# Patient Record
Sex: Female | Born: 1960 | Race: Black or African American | Hispanic: No | Marital: Married | Smoking: Former smoker
Health system: Southern US, Community
[De-identification: ages and names within clinical notes are randomized; demographics above are authoritative.]

## PROBLEM LIST (undated history)

## (undated) DIAGNOSIS — F32A Depression, unspecified: Secondary | ICD-10-CM

## (undated) DIAGNOSIS — Z8661 Personal history of infections of the central nervous system: Secondary | ICD-10-CM

## (undated) DIAGNOSIS — M1990 Unspecified osteoarthritis, unspecified site: Secondary | ICD-10-CM

## (undated) DIAGNOSIS — K219 Gastro-esophageal reflux disease without esophagitis: Secondary | ICD-10-CM

## (undated) DIAGNOSIS — H9190 Unspecified hearing loss, unspecified ear: Secondary | ICD-10-CM

## (undated) DIAGNOSIS — I10 Essential (primary) hypertension: Secondary | ICD-10-CM

## (undated) DIAGNOSIS — J45909 Unspecified asthma, uncomplicated: Secondary | ICD-10-CM

## (undated) DIAGNOSIS — E785 Hyperlipidemia, unspecified: Secondary | ICD-10-CM

## (undated) DIAGNOSIS — G43909 Migraine, unspecified, not intractable, without status migrainosus: Secondary | ICD-10-CM

## (undated) DIAGNOSIS — G8929 Other chronic pain: Secondary | ICD-10-CM

## (undated) DIAGNOSIS — T7840XA Allergy, unspecified, initial encounter: Secondary | ICD-10-CM

## (undated) DIAGNOSIS — M797 Fibromyalgia: Secondary | ICD-10-CM

## (undated) HISTORY — DX: Other chronic pain: G89.29

## (undated) HISTORY — DX: Unspecified asthma, uncomplicated: J45.909

## (undated) HISTORY — DX: Gastro-esophageal reflux disease without esophagitis: K21.9

## (undated) HISTORY — PX: DILATION AND CURETTAGE OF UTERUS: SHX78

## (undated) HISTORY — PX: KNEE SURGERY: SHX244

## (undated) HISTORY — PX: LUMBAR SPINE SURGERY: SHX701

## (undated) HISTORY — PX: HAND SURGERY: SHX662

## (undated) HISTORY — DX: Allergy, unspecified, initial encounter: T78.40XA

## (undated) HISTORY — PX: OTHER SURGICAL HISTORY: SHX169

## (undated) HISTORY — DX: Migraine, unspecified, not intractable, without status migrainosus: G43.909

## (undated) HISTORY — DX: Essential (primary) hypertension: I10

## (undated) HISTORY — PX: ABDOMINAL HYSTERECTOMY: SHX81

## (undated) HISTORY — PX: APPENDECTOMY: SHX54

## (undated) HISTORY — DX: Depression, unspecified: F32.A

## (undated) HISTORY — DX: Hyperlipidemia, unspecified: E78.5

## (undated) HISTORY — DX: Unspecified hearing loss, unspecified ear: H91.90

---

## 2013-02-12 HISTORY — PX: KNEE SURGERY: SHX244

## 2014-02-12 HISTORY — PX: LUMBAR FUSION: SHX111

## 2017-03-15 HISTORY — PX: COLONOSCOPY: SHX174

## 2017-12-28 LAB — HM COLONOSCOPY

## 2020-06-03 DIAGNOSIS — M542 Cervicalgia: Secondary | ICD-10-CM | POA: Diagnosis not present

## 2020-06-03 DIAGNOSIS — M25511 Pain in right shoulder: Secondary | ICD-10-CM | POA: Diagnosis not present

## 2020-06-12 ENCOUNTER — Ambulatory Visit (INDEPENDENT_AMBULATORY_CARE_PROVIDER_SITE_OTHER): Payer: Medicare HMO | Admitting: Medical

## 2020-06-12 ENCOUNTER — Other Ambulatory Visit: Payer: Self-pay

## 2020-06-12 ENCOUNTER — Encounter: Payer: Self-pay | Admitting: Medical

## 2020-06-12 VITALS — BP 122/82 | HR 61 | Ht 63.0 in | Wt 151.2 lb

## 2020-06-12 DIAGNOSIS — R5383 Other fatigue: Secondary | ICD-10-CM | POA: Insufficient documentation

## 2020-06-12 DIAGNOSIS — G43809 Other migraine, not intractable, without status migrainosus: Secondary | ICD-10-CM

## 2020-06-12 DIAGNOSIS — M545 Low back pain, unspecified: Secondary | ICD-10-CM | POA: Diagnosis not present

## 2020-06-12 DIAGNOSIS — R110 Nausea: Secondary | ICD-10-CM | POA: Insufficient documentation

## 2020-06-12 DIAGNOSIS — F17200 Nicotine dependence, unspecified, uncomplicated: Secondary | ICD-10-CM | POA: Insufficient documentation

## 2020-06-12 DIAGNOSIS — R202 Paresthesia of skin: Secondary | ICD-10-CM

## 2020-06-12 DIAGNOSIS — M79601 Pain in right arm: Secondary | ICD-10-CM

## 2020-06-12 DIAGNOSIS — W19XXXA Unspecified fall, initial encounter: Secondary | ICD-10-CM | POA: Insufficient documentation

## 2020-06-12 DIAGNOSIS — K219 Gastro-esophageal reflux disease without esophagitis: Secondary | ICD-10-CM | POA: Diagnosis not present

## 2020-06-12 DIAGNOSIS — I10 Essential (primary) hypertension: Secondary | ICD-10-CM | POA: Insufficient documentation

## 2020-06-12 DIAGNOSIS — F339 Major depressive disorder, recurrent, unspecified: Secondary | ICD-10-CM | POA: Diagnosis not present

## 2020-06-12 DIAGNOSIS — M79602 Pain in left arm: Secondary | ICD-10-CM | POA: Diagnosis not present

## 2020-06-12 DIAGNOSIS — G8929 Other chronic pain: Secondary | ICD-10-CM | POA: Insufficient documentation

## 2020-06-12 DIAGNOSIS — G43909 Migraine, unspecified, not intractable, without status migrainosus: Secondary | ICD-10-CM | POA: Insufficient documentation

## 2020-06-12 DIAGNOSIS — G4700 Insomnia, unspecified: Secondary | ICD-10-CM | POA: Diagnosis not present

## 2020-06-12 DIAGNOSIS — E785 Hyperlipidemia, unspecified: Secondary | ICD-10-CM | POA: Diagnosis not present

## 2020-06-12 DIAGNOSIS — R7989 Other specified abnormal findings of blood chemistry: Secondary | ICD-10-CM | POA: Insufficient documentation

## 2020-06-12 DIAGNOSIS — R6889 Other general symptoms and signs: Secondary | ICD-10-CM | POA: Insufficient documentation

## 2020-06-12 DIAGNOSIS — Z1321 Encounter for screening for nutritional disorder: Secondary | ICD-10-CM | POA: Insufficient documentation

## 2020-06-12 NOTE — Progress Notes (Addendum)
Subjective:  Desiree Thomas is a 60 y.o. female who presents for Chief Complaint  Patient presents with  . New Patient (Initial Visit)    Pt present to establish care and discuss right shoulder pain. Seen Dewaine Conger and had x-rays done.       Here as a new patient.  Was seeing [facility] prior.  Was seeing pain clinic, neurology, PCP.  Was not seeing counseling or psychiatrist before she moved.     Moved to [place] in 02/2020 after getting married  Here to establish care.  On disability for migraines and depression  Reviewed list of medicaiton  Having right shoulder pains.  Just established with Dr. Madelon Lips, [facility], ortho  recently started having problems with fingers getting numb.    Always cold feeling.  Curious if anemic.    Hx/o hysterectomy but still has both ovaries.  She notes that most of her medicaiton are for migraines.  She notes she does not have hypertension, but on the BP medications for migraines.  Likewise states she is on Prozac and Seroquel for migraines.   Takes Propranolol ER 60mg  daily QHS.  seroquel 25mg  prn for pain, sleep for breakthrough migraines  She notes history of falls a few times this past year in recent months.  No other aggravating or relieving factors.    No other c/o.  Past Medical History:  Diagnosis Date  . Allergy   . Asthma   . Chronic back pain   . Depression   . GERD (gastroesophageal reflux disease)   . Hearing loss   . Hyperlipidemia   . Hypertension   . Migraine    Current Outpatient Medications on File Prior to Visit  Medication Sig Dispense Refill  . amLODipine (NORVASC) 10 MG tablet     . atorvastatin (LIPITOR) 40 MG tablet     . b complex vitamins capsule Take 1 capsule by mouth daily.    . COLLAGEN PO Take by mouth.    . esomeprazole (NEXIUM) 40 MG capsule     . FLUoxetine (PROZAC) 20 MG capsule Take 40 mg by mouth daily.    . magnesium gluconate (MAGONATE) 500 MG tablet Take 500 mg by mouth daily.    .  methocarbamol (ROBAXIN) 750 MG tablet Take 750 mg by mouth every 8 (eight) hours as needed. Takes prn day , but QHS daily    . ondansetron (ZOFRAN-ODT) 8 MG disintegrating tablet Take 8 mg by mouth every 8 (eight) hours as needed for nausea or vomiting.    oxybutynin (DITROPAN) 5 MG tablet Take 5 mg by mouth 2 (two) times daily.    . pregabalin (LYRICA) 150 MG capsule Take 150 mg by mouth in the morning, at noon, and at bedtime.    . propranolol ER (INDERAL LA) 60 MG 24 hr capsule Take 60 mg by mouth at bedtime.    QUEtiapine (SEROQUEL) 25 MG tablet Take 25 mg by mouth as needed.    . rizatriptan (MAXALT) 5 MG tablet Take 5 mg by mouth as needed for migraine. May repeat in 2 hours if needed    . traZODone (DESYREL) 50 MG tablet Take 100 mg by mouth at bedtime.     No current facility-administered medications on file prior to visit.     The following portions of the patient's history were reviewed and updated as appropriate: allergies, current medications, past family history, past medical history, past social history, past surgical history and problem list.  ROS Otherwise  as in subjective above    Objective: BP 122/82   Pulse 61   Ht 5\' 3"  (1.6 m)   Wt 151 lb 3.2 oz (68.6 kg)   SpO2 97%   BMI 26.78 kg/m   General appearance: alert, no distress, well developed, well nourished, African American female Neck: supple, no lymphadenopathy, no thyromegaly, no masses, nontender, no bruits Heart: RRR, normal S1, S2, no murmurs Lungs: CTA bilaterally, no wheezes, rhonchi, or rales Pulses: 2+ radial pulses, 2+ pedal pulses, normal cap refill Ext: no edema -phanels and tinels, relatively normal strength and sensation of arms    Assessment: Encounter Diagnoses  Name Primary?  . Chronic bilateral low back pain, unspecified whether sciatica present Yes  . Depression, recurrent (HCC)   . Smoker   . Gastroesophageal reflux disease, unspecified whether esophagitis present   . Insomnia,  unspecified type   . Other migraine without status migrainosus, not intractable   . Nausea   . Hyperlipidemia, unspecified hyperlipidemia type   . Paresthesia and pain of both upper extremities   . Encounter for vitamin deficiency screening   . Fall, initial encounter   . Fatigue, unspecified type   . Cold feeling   . Elevated LFTs      Plan: We discussed her medical history, reviewed her list of medications.  She requests referral to pain clinic.  She was seen in pain clinic regularly up [facility].  She has chronic pain, high risk medications.  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  She denies hypertension diagnosis  Labs today given her concern of feeling cold and high risk medication, history of elevated liver test  Discussed fall prevention.  Lorel was seen today for new patient (initial visit).  Diagnoses and all orders for this visit:  Chronic bilateral low back pain, unspecified whether sciatica present -     Ambulatory referral to Pain Clinic  Depression, recurrent (HCC) -     Ambulatory referral to Pain Clinic  Smoker  Gastroesophageal reflux disease, unspecified whether esophagitis present  Insomnia, unspecified type  Other migraine without status migrainosus, not intractable -     Ambulatory referral to Pain Clinic -     CBC with Differential/Platelet -     TSH -     Comprehensive metabolic panel -     VITAMIN D 25 Hydroxy (Vit-D Deficiency, Fractures) -     Vitamin B12  Nausea  Hyperlipidemia, unspecified hyperlipidemia type -     Comprehensive metabolic panel  Paresthesia and pain of both upper extremities -     Ambulatory referral to Pain Clinic -     CBC with Differential/Platelet -     TSH -     Comprehensive  metabolic panel -     VITAMIN D 25 Hydroxy (Vit-D Deficiency, Fractures) -     Vitamin B12  Encounter for vitamin deficiency screening -     VITAMIN D 25 Hydroxy (Vit-D Deficiency, Fractures) -     Vitamin B12  Fall, initial encounter  Fatigue, unspecified type  Cold feeling  Elevated LFTs    Follow up: pending labs

## 2020-06-13 LAB — COMPREHENSIVE METABOLIC PANEL
ALT: 27 IU/L (ref 0–32)
AST: 32 IU/L (ref 0–40)
Albumin/Globulin Ratio: 1.8 (ref 1.2–2.2)
Albumin: 4.4 g/dL (ref 3.8–4.9)
Alkaline Phosphatase: 85 IU/L (ref 44–121)
BUN/Creatinine Ratio: 16 (ref 9–23)
BUN: 12 mg/dL (ref 6–24)
Bilirubin Total: 0.2 mg/dL (ref 0.0–1.2)
CO2: 23 mmol/L (ref 20–29)
Calcium: 9.5 mg/dL (ref 8.7–10.2)
Chloride: 102 mmol/L (ref 96–106)
Creatinine, Ser: 0.77 mg/dL (ref 0.57–1.00)
Globulin, Total: 2.5 g/dL (ref 1.5–4.5)
Glucose: 81 mg/dL (ref 65–99)
Potassium: 4.6 mmol/L (ref 3.5–5.2)
Sodium: 139 mmol/L (ref 134–144)
Total Protein: 6.9 g/dL (ref 6.0–8.5)
eGFR: 89 mL/min/{1.73_m2} (ref >59)

## 2020-06-13 LAB — CBC WITH DIFFERENTIAL/PLATELET
Basophils Absolute: 0.1 10*3/uL (ref 0.0–0.2)
Basos: 1 %
EOS (ABSOLUTE): 0.1 10*3/uL (ref 0.0–0.4)
Eos: 2 %
Hematocrit: 36.9 % (ref 34.0–46.6)
Hemoglobin: 12.4 g/dL (ref 11.1–15.9)
Immature Grans (Abs): 0 10*3/uL (ref 0.0–0.1)
Immature Granulocytes: 0 %
Lymphocytes Absolute: 2.4 10*3/uL (ref 0.7–3.1)
Lymphs: 38 %
MCH: 28.8 pg (ref 26.6–33.0)
MCHC: 33.6 g/dL (ref 31.5–35.7)
MCV: 86 fL (ref 79–97)
Monocytes Absolute: 0.6 10*3/uL (ref 0.1–0.9)
Monocytes: 9 %
Neutrophils Absolute: 3.3 10*3/uL (ref 1.4–7.0)
Neutrophils: 50 %
Platelets: 341 10*3/uL (ref 150–450)
RBC: 4.31 x10E6/uL (ref 3.77–5.28)
RDW: 15.2 % (ref 11.7–15.4)
WBC: 6.5 10*3/uL (ref 3.4–10.8)

## 2020-06-13 LAB — VITAMIN D 25 HYDROXY (VIT D DEFICIENCY, FRACTURES): Vit D, 25-Hydroxy: 17.6 ng/mL — ABNORMAL LOW (ref 30.0–100.0)

## 2020-06-13 LAB — TSH: TSH: 0.891 u[IU]/mL (ref 0.450–4.500)

## 2020-06-13 LAB — VITAMIN B12: Vitamin B-12: 1415 pg/mL — ABNORMAL HIGH (ref 232–1245)

## 2020-06-13 NOTE — Progress Notes (Signed)
Referral sent to pain clinic.

## 2020-06-24 DIAGNOSIS — G894 Chronic pain syndrome: Secondary | ICD-10-CM | POA: Diagnosis not present

## 2020-06-24 DIAGNOSIS — M25519 Pain in unspecified shoulder: Secondary | ICD-10-CM | POA: Diagnosis not present

## 2020-06-24 DIAGNOSIS — M4727 Other spondylosis with radiculopathy, lumbosacral region: Secondary | ICD-10-CM | POA: Diagnosis not present

## 2020-06-26 ENCOUNTER — Other Ambulatory Visit: Payer: Self-pay | Admitting: Physician Assistant

## 2020-06-26 ENCOUNTER — Ambulatory Visit
Admission: RE | Admit: 2020-06-26 | Discharge: 2020-06-26 | Disposition: A | Discharge status: Home / Self Care | Payer: Medicare HMO | Source: Ambulatory Visit | Attending: Physician Assistant | Admitting: Physician Assistant

## 2020-06-26 DIAGNOSIS — M25511 Pain in right shoulder: Secondary | ICD-10-CM | POA: Diagnosis not present

## 2020-06-26 DIAGNOSIS — M545 Low back pain, unspecified: Secondary | ICD-10-CM | POA: Diagnosis not present

## 2020-06-26 DIAGNOSIS — M4727 Other spondylosis with radiculopathy, lumbosacral region: Secondary | ICD-10-CM

## 2020-06-30 DIAGNOSIS — M542 Cervicalgia: Secondary | ICD-10-CM | POA: Diagnosis not present

## 2020-07-07 DIAGNOSIS — M542 Cervicalgia: Secondary | ICD-10-CM | POA: Diagnosis not present

## 2020-07-10 NOTE — Progress Notes (Deleted)
No show

## 2020-07-11 ENCOUNTER — Ambulatory Visit: Payer: Self-pay | Admitting: Family Medicine

## 2020-08-05 ENCOUNTER — Telehealth: Payer: Self-pay | Admitting: Medical

## 2020-08-05 ENCOUNTER — Other Ambulatory Visit: Payer: Self-pay | Admitting: Medical

## 2020-08-05 ENCOUNTER — Ambulatory Visit
Admission: RE | Admit: 2020-08-05 | Discharge: 2020-08-05 | Disposition: A | Discharge status: Home / Self Care | Payer: Medicare HMO | Source: Ambulatory Visit | Attending: Medical | Admitting: Medical

## 2020-08-05 ENCOUNTER — Other Ambulatory Visit: Payer: Self-pay

## 2020-08-05 DIAGNOSIS — Z139 Encounter for screening, unspecified: Secondary | ICD-10-CM

## 2020-08-05 DIAGNOSIS — Z1231 Encounter for screening mammogram for malignant neoplasm of breast: Secondary | ICD-10-CM | POA: Diagnosis not present

## 2020-08-05 MED ORDER — PREGABALIN 150 MG PO CAPS: 150.0000 mg | ORAL_CAPSULE | Freq: Three times a day (TID) | ORAL | 0 refills | Status: DC

## 2020-08-05 NOTE — Telephone Encounter (Signed)
Pt came in and requested refills on pregabalin. Please send to [facility] at [address] and [address].

## 2020-09-04 ENCOUNTER — Ambulatory Visit (INDEPENDENT_AMBULATORY_CARE_PROVIDER_SITE_OTHER): Payer: Medicare HMO | Admitting: Medical

## 2020-09-04 ENCOUNTER — Encounter: Payer: Self-pay | Admitting: Medical

## 2020-09-04 ENCOUNTER — Other Ambulatory Visit: Payer: Self-pay

## 2020-09-04 VITALS — BP 110/72 | HR 56 | Ht 62.0 in | Wt 150.6 lb

## 2020-09-04 DIAGNOSIS — E559 Vitamin D deficiency, unspecified: Secondary | ICD-10-CM

## 2020-09-04 DIAGNOSIS — M79601 Pain in right arm: Secondary | ICD-10-CM

## 2020-09-04 DIAGNOSIS — E785 Hyperlipidemia, unspecified: Secondary | ICD-10-CM

## 2020-09-04 DIAGNOSIS — M79602 Pain in left arm: Secondary | ICD-10-CM

## 2020-09-04 DIAGNOSIS — G4700 Insomnia, unspecified: Secondary | ICD-10-CM

## 2020-09-04 DIAGNOSIS — Z23 Encounter for immunization: Secondary | ICD-10-CM | POA: Diagnosis not present

## 2020-09-04 DIAGNOSIS — Z1159 Encounter for screening for other viral diseases: Secondary | ICD-10-CM

## 2020-09-04 DIAGNOSIS — N3281 Overactive bladder: Secondary | ICD-10-CM

## 2020-09-04 DIAGNOSIS — M545 Low back pain, unspecified: Secondary | ICD-10-CM | POA: Diagnosis not present

## 2020-09-04 DIAGNOSIS — K219 Gastro-esophageal reflux disease without esophagitis: Secondary | ICD-10-CM

## 2020-09-04 DIAGNOSIS — F17200 Nicotine dependence, unspecified, uncomplicated: Secondary | ICD-10-CM | POA: Diagnosis not present

## 2020-09-04 DIAGNOSIS — Z0000 Encounter for general adult medical examination without abnormal findings: Secondary | ICD-10-CM | POA: Diagnosis not present

## 2020-09-04 DIAGNOSIS — F324 Major depressive disorder, single episode, in partial remission: Secondary | ICD-10-CM

## 2020-09-04 DIAGNOSIS — M542 Cervicalgia: Secondary | ICD-10-CM | POA: Diagnosis not present

## 2020-09-04 DIAGNOSIS — G43809 Other migraine, not intractable, without status migrainosus: Secondary | ICD-10-CM

## 2020-09-04 DIAGNOSIS — G8929 Other chronic pain: Secondary | ICD-10-CM

## 2020-09-04 DIAGNOSIS — I10 Essential (primary) hypertension: Secondary | ICD-10-CM

## 2020-09-04 DIAGNOSIS — R110 Nausea: Secondary | ICD-10-CM

## 2020-09-04 DIAGNOSIS — R202 Paresthesia of skin: Secondary | ICD-10-CM

## 2020-09-04 NOTE — Addendum Note (Signed)
Addended by: Victorio Palm on: 09/04/2020 04:13 PM   Modules accepted: Orders

## 2020-09-04 NOTE — Progress Notes (Addendum)
Subjective:   HPI  Desiree Thomas is a 60 y.o. female who presents for Chief Complaint  Patient presents with   Annual Exam    Patient Care Team: Abdulwahab Demelo, Cleda Mccreedyavid S, PA-C as PCP - General (Family Medicine) Sees dentist [facility] her in past year Pain management  Concerns: [facility]  she like to see a different orthopedic.  She ended up having MRI of right shoulder and neck.  She notes that there was not any specific plan on how to move forward.  She does not have a follow-up with them.  She continues to have quite a bit of pain in her right upper arm and chest wall and the pain seems to radiate into the breast but mostly concentrated around the right upper arm and shoulder.  She does at times get tingly and weak in the right arm.  She has a history of chronic back pain, history of low back surgery and is on disability for pain and migraines.  She does take Lyrica 3 times a day, uses tramadol fairly regularly.  She does see a pain clinic  She has migraines.  When she has a flareup she uses Tylenol and if that does not help she goes to Maxalt and if that still does not help she goes to Seroquel.  She would like to see a neurologist locally for management of migraines.  Overall they have been relatively controlled of late  Insomnia-uses trazodone nightly for sleep  She continues on vitamin D supplement.  She takes medicine for acid reflux Nexium 40 mg daily.  Hyperlipidemia-compliant with Lipitor 40 mg daily  She is compliant with Prozac 20 mg daily  Hypertension-compliant with amlodipine 10 mg daily, propanolol 60 mg daily  She has history of endometriosis and prior hysterectomy for this  Reviewed their medical, surgical, family, social, medication, and allergy history and updated chart as appropriate.  Past Medical History:  Diagnosis Date   Allergy    Asthma    Chronic  back pain    Depression    GERD (gastroesophageal reflux disease)    Hearing loss    Hyperlipidemia    Hypertension    Migraine     Family History  Problem Relation Age of Onset   Arthritis Mother    Heart disease Mother    Asthma Mother    Diverticulitis Mother    Diabetes Father    Diverticulitis Father    Arthritis Sister    Asthma Sister    Arthritis Sister    Asthma Sister    Cancer Maternal Grandmother        stomach   Diabetes Paternal Grandmother    Dementia Paternal Grandmother      Current Outpatient Medications:    amLODipine (NORVASC) 10 MG tablet, , Disp: , Rfl:    atorvastatin (LIPITOR) 40 MG tablet, , Disp: , Rfl:    b complex vitamins capsule, Take 1 capsule by mouth daily., Disp: , Rfl:    COLLAGEN PO, Take by mouth., Disp: , Rfl:    esomeprazole (NEXIUM) 40 MG capsule, , Disp: , Rfl:    FLUoxetine (PROZAC) 20 MG capsule, Take 40 mg by mouth daily., Disp: , Rfl:    magnesium gluconate (MAGONATE) 500 MG tablet, Take 500 mg by mouth daily., Disp: , Rfl:    methocarbamol (ROBAXIN) 750 MG tablet, Take 750 mg by mouth every 8 (eight) hours as  needed. Takes prn day , but QHS daily, Disp: , Rfl:    ondansetron (ZOFRAN-ODT) 8 MG disintegrating tablet, Take 8 mg by mouth every 8 (eight) hours as needed for nausea or vomiting., Disp: , Rfl:    oxybutynin (DITROPAN) 5 MG tablet, Take 5 mg by mouth 2 (two) times daily., Disp: , Rfl:    pregabalin (LYRICA) 150 MG capsule, Take 1 capsule (150 mg total) by mouth in the morning, at noon, and at bedtime., Disp: 90 capsule, Rfl: 0   propranolol ER (INDERAL LA) 60 MG 24 hr capsule, Take 60 mg by mouth at bedtime., Disp: , Rfl:    QUEtiapine (SEROQUEL) 25 MG tablet, Take 25 mg by mouth as needed., Disp: , Rfl:    rizatriptan (MAXALT) 5 MG tablet, Take 5 mg by mouth as needed for migraine. May repeat in 2 hours if needed, Disp: , Rfl:    traMADol (ULTRAM) 50 MG tablet, Take by mouth every 6 (six) hours as needed., Disp: , Rfl:     traZODone (DESYREL) 50 MG tablet, Take 100 mg by mouth at bedtime., Disp: , Rfl:   Allergies  Allergen Reactions   Morphine And Related Anaphylaxis   Penicillins Hives   Vicodin Hp [Hydrocodone-Acetaminophen] Itching     Review of Systems Constitutional: -fever, -chills, -sweats, -unexpected weight change, -decreased appetite, -fatigue Allergy: -sneezing, -itching, -congestion Dermatology: -changing moles, --rash, -lumps ENT: -runny nose, -ear pain, -sore throat, -hoarseness, -sinus pain, -teeth pain, - ringing in ears, -hearing loss, -nosebleeds Cardiology: -chest pain, -palpitations, -swelling, -difficulty breathing when lying flat, -waking up short of breath Respiratory: -cough, -shortness of breath, -difficulty breathing with exercise or exertion, -wheezing, -coughing up blood Gastroenterology: -abdominal pain, -nausea, -vomiting, -diarrhea, -constipation, -blood in stool, -changes in bowel movement, -difficulty swallowing or eating Hematology: -bleeding, -bruising  Musculoskeletal: +arthritis, -muscle aches, -joint swelling, +chronic back pain, +neck pain, -cramping, -changes in gait Ophthalmology: denies vision changes, eye redness, itching, discharge Urology: -burning with urination, -difficulty urinating, -blood in urine, -urinary frequency, -urgency, -incontinence Neurology: -headache, -weakness, +tingling, weakness, pain in right arm, -memory loss, -falls, -dizziness Psychology: -depressed mood, -agitation, +sleep problems Breast/gyn: -breast tendnerss, -discharge, -lumps, -vaginal discharge,- irregular periods, -heavy periods   Depression screen [facility] 2/9 09/04/2020 06/12/2020  Decreased Interest 0 0  Down, Depressed, Hopeless 0 0  PHQ - 2 Score 0 0  Altered sleeping 0 -  Tired, decreased energy 0 -  Change in appetite 0 -  Feeling bad or failure about yourself  0 -  Trouble concentrating 0 -  Moving slowly or fidgety/restless 0 -  Suicidal thoughts 0 -  PHQ-9 Score 0  -       Objective:  BP 110/72   Pulse (!) 56   Ht  (1.575 m)   Wt 150 lb 9.6 oz (68.3 kg)   SpO2 96%   BMI 27.55 kg/m   General appearance: alert, no distress, WD/WN, African American female Skin: tattoo left wrist, otherwise no worrisome lesions HEENT: normocephalic, conjunctiva/corneas normal, sclerae anicteric, PERRLA, EOMi Neck: posterior and posterolateral tendnerss right, otherwise supple, no lymphadenopathy, no thyromegaly, no masses, normal ROM, no bruits Chest: non tender, normal shape and expansion Heart: RRR, normal S1, S2, no murmurs Lungs: CTA bilaterally, no wheezes, rhonchi, or rales Abdomen: +bs, soft, non tender, non distended, no masses, no hepatomegaly, no splenomegaly, no bruits Back: lumbar surgical scar, some pain with flexion and extension but ROM relatively full, non tender, no scoliosis Musculoskeletal: Tender right upper arm throughout, tender  over [facility] joint and biceps groove, pain noted with range of motion which is somewhat reduced with flexion and abduction, she was somewhat guarded on the right shoulder, rest of arm unremarkable otherwise arm nontender upper extremities non tender, no obvious deformity, normal ROM throughout, lower extremities non tender, no obvious deformity, normal ROM throughout Extremities: no edema, no cyanosis, no clubbing Pulses: 2+ symmetric, upper and 1+ lower extremities, normal cap refill Neurological: Grip strength somewhat weak bilaterally, alert, oriented x 3, CN2-12 intact, strength normal upper extremities and lower extremities, sensation normal throughout, DTRs 2+ throughout, no cerebellar signs, gait normal Psychiatric: normal affect, behavior normal, pleasant  Breast/gyn/rectal - deferred   Adult ECG Report  Indication: Physical, hypertension, screen for heart disease  Rate: 47 bpm  Rhythm: sinus bradycardia  QRS Axis: -3 degrees  PR Interval: 152 ms  QRS Duration: 84 ms  QTc:  Conduction Disturbances:  possible left atrial enlargement  Other Abnormalities:   Patient's cardiac risk factors are: dyslipidemia and hypertension.  EKG comparison: none  Narrative Interpretation: abnormal, sinus bradycardia   Assessment and Plan :   Encounter Diagnoses  Name Primary?   Encounter for health maintenance examination in adult Yes   Vitamin D deficiency    Hyperlipidemia, unspecified hyperlipidemia type    Encounter for hepatitis C screening test for low risk patient    Essential hypertension, benign    Need for Td vaccine    Chronic bilateral low back pain without sciatica    Chronic neck pain    Right arm pain    Smoker    Paresthesia and pain of both upper extremities    Other migraine without status migrainosus, not intractable    Nausea    Insomnia, unspecified type    Gastroesophageal reflux disease, unspecified whether esophagitis present    Depression, major, single episode, in partial remission (HCC)    Overactive bladder      This visit was a preventative care visit, also known as wellness visit or routine physical.   Topics typically include healthy lifestyle, diet, exercise, preventative care, vaccinations, sick and well care, proper use of emergency dept and after hours care, as well as other concerns.     Recommendations: Continue to return yearly for your annual wellness and preventative care visits.  This gives [place] a chance to discuss healthy lifestyle, exercise, vaccinations, review your chart record, and perform screenings where appropriate.  I recommend you see your eye doctor yearly for routine vision care.  I recommend you see your dentist yearly for routine dental care including hygiene visits twice yearly.   Vaccination recommendations were reviewed Immunization History  Administered Date(s) Administered   PFIZER(Purple Top)SARS-COV-2 Vaccination 06/12/2019, 07/03/2019    Counseled on the Td (tetanus, diptheria) vaccine.  Vaccine information sheet given. Td  vaccine given after consent obtained.  She notes prior pneumococcal vaccine and prior shingles vaccine.  We do not have these records on file.   Screening for cancer: Colon cancer screening: I do not have a copy of her prior colonoscopy from 2019  Breast cancer screening: You should perform a self breast exam monthly.   We reviewed recommendations for regular mammograms and breast cancer screening. I reviewed her recent mammogram  Cervical cancer screening: We reviewed recommendations for pap smear screening.  She is status post hysterectomy   Skin cancer screening: Check your skin regularly for new changes, growing lesions, or other lesions of concern Come in for evaluation if you have skin lesions of concern.  Lung cancer screening: If you have a greater than 20 pack year history of tobacco use, then you may qualify for lung cancer screening with a chest CT scan.   Please call your insurance company to inquire about coverage for this test.  We currently don't have screenings for other cancers besides breast, cervical, colon, and lung cancers.  If you have a strong family history of cancer or have other cancer screening concerns, please let me know.    Bone health: Get at least 150 minutes of aerobic exercise weekly Get weight bearing exercise at least once weekly Bone density test:  A bone density test is an imaging test that uses a type of X-ray to measure the amount of calcium and other minerals in your bones. The test may be used to diagnose or screen you for a condition that causes weak or thin bones (osteoporosis), predict your risk for a broken bone (fracture), or determine how well your osteoporosis treatment is working. The bone density test is recommended for females 65 and older, or females or males <65 if certain risk factors such as thyroid disease, long term use of steroids such as for asthma or rheumatological issues, vitamin D deficiency, estrogen deficiency, family  history of osteoporosis, self or family history of fragility fracture in first degree relative.    Heart health: Get at least 150 minutes of aerobic exercise weekly Limit alcohol It is important to maintain a healthy blood pressure and healthy cholesterol numbers  Heart disease screening: Screening for heart disease includes screening for blood pressure, fasting lipids, glucose/diabetes screening, BMI height to weight ratio, reviewed of smoking status, physical activity, and diet.    Goals include blood pressure 120/80 or less, maintaining a healthy lipid/cholesterol profile, preventing diabetes or keeping diabetes numbers under good control, not smoking or using tobacco products, exercising most days per week or at least 150 minutes per week of exercise, and eating healthy variety of fruits and vegetables, healthy oils, and avoiding unhealthy food choices like fried food, fast food, high sugar and high cholesterol foods.    Other tests may possibly include EKG test, CT coronary calcium score, echocardiogram, exercise treadmill stress test.    Medical care options: I recommend you continue to seek care here first for routine care.  We try really hard to have available appointments Monday through Friday daytime hours for sick visits, acute visits, and physicals.  Urgent care should be used for after hours and weekends for significant issues that cannot wait till the next day.  The emergency department should be used for significant potentially life-threatening emergencies.  The emergency department is expensive, can often have long wait times for less significant concerns, so try to utilize primary care, urgent care, or telemedicine when possible to avoid unnecessary trips to the emergency department.  Virtual visits and telemedicine have been introduced since the pandemic started in 2020, and can be convenient ways to receive medical care.  We offer virtual appointments as well to assist you in a  variety of options to seek medical care.    Separate significant issues discussed: Vitamin D deficiency-update labs today since she has been on supplement  Hypertension-continue amlodipine 10 mg daily.  She is taking propanolol for blood pressure and migraine prevention.  EKG reviewed  Hyperlipidemia-on statin.  Reviewed labs from March 2022  Chronic back pain-sees pain management, on several medications including Robaxin as needed, Lyrica 3 times daily  Chronic neck and arm pain-we will request recent records from orthopedics.  She is requesting referral to different specialist.  She was not happy with that visit.  Referral to neurology  Smoker-advise cessation.  Consider medication or referral to smoking cessation class  Insomnia-continue trazodone nightly and practice good sleep hygiene  GERD-on long-term medication for this  Migraines-continue current regimen and abortive therapy.  Referral to neurology  Depression in partial remission-continue Prozac 20 mg daily.  Overactive bladder-continue oxybutynin   Reham was seen today for annual exam.  Diagnoses and all orders for this visit:  Encounter for health maintenance examination in adult -     VITAMIN D 25 Hydroxy (Vit-D Deficiency, Fractures) -     Lipid panel -     HIV Antibody (routine testing w rflx) -     Hepatitis C antibody  Vitamin D deficiency -     VITAMIN D 25 Hydroxy (Vit-D Deficiency, Fractures)  Hyperlipidemia, unspecified hyperlipidemia type -     Lipid panel  Encounter for hepatitis C screening test for low risk patient -     Hepatitis C antibody -     EKG 12-Lead  Essential hypertension, benign -     EKG 12-Lead  Need for Td vaccine  Chronic bilateral low back pain without sciatica  Chronic neck pain -     Ambulatory referral to Neurology  Right arm pain -     Ambulatory referral to Neurology  Smoker -     Ambulatory referral to Smoking Cessation Program  Paresthesia and pain of  both upper extremities -     Ambulatory referral to Neurology  Other migraine without status migrainosus, not intractable -     Ambulatory referral to Neurology  Nausea  Insomnia, unspecified type -     Ambulatory referral to Neurology  Gastroesophageal reflux disease, unspecified whether esophagitis present  Depression, major, single episode, in partial remission (HCC)  Overactive bladder    Follow-up pending labs, yearly for physical

## 2020-09-05 ENCOUNTER — Other Ambulatory Visit: Payer: Self-pay | Admitting: Medical

## 2020-09-05 LAB — LIPID PANEL
Chol/HDL Ratio: 2.9 ratio (ref 0.0–4.4)
Cholesterol, Total: 177 mg/dL (ref 100–199)
HDL: 61 mg/dL (ref >39)
LDL Chol Calc (NIH): 97 mg/dL (ref 0–99)
Triglycerides: 107 mg/dL (ref 0–149)
VLDL Cholesterol Cal: 19 mg/dL (ref 5–40)

## 2020-09-05 LAB — HEPATITIS C ANTIBODY: Hep C Virus Ab: <0.1 s/co ratio (ref 0.0–0.9)

## 2020-09-05 LAB — VITAMIN D 25 HYDROXY (VIT D DEFICIENCY, FRACTURES): Vit D, 25-Hydroxy: 28.7 ng/mL — ABNORMAL LOW (ref 30.0–100.0)

## 2020-09-05 LAB — HIV ANTIBODY (ROUTINE TESTING W REFLEX): HIV Screen 4th Generation wRfx: NONREACTIVE

## 2020-09-05 MED ORDER — VITAMIN D (ERGOCALCIFEROL) 1.25 MG (50000 UNIT) PO CAPS: 50000.0000 [IU] | ORAL_CAPSULE | ORAL | 3 refills | Status: DC

## 2020-09-07 ENCOUNTER — Other Ambulatory Visit: Payer: Self-pay | Admitting: Medical

## 2020-09-08 MED ORDER — PREGABALIN 150 MG PO CAPS: ORAL_CAPSULE | ORAL | 2 refills | Status: DC

## 2020-09-08 NOTE — Telephone Encounter (Signed)
Pt had cpe last week with shane. Please refill med

## 2020-09-08 NOTE — Addendum Note (Signed)
Addended by: Herminio Commons A on: 09/08/2020 11:37 AM   Modules accepted: Orders

## 2020-09-09 DIAGNOSIS — M4727 Other spondylosis with radiculopathy, lumbosacral region: Secondary | ICD-10-CM | POA: Diagnosis not present

## 2020-09-09 DIAGNOSIS — M25519 Pain in unspecified shoulder: Secondary | ICD-10-CM | POA: Diagnosis not present

## 2020-09-09 DIAGNOSIS — G894 Chronic pain syndrome: Secondary | ICD-10-CM | POA: Diagnosis not present

## 2020-09-12 ENCOUNTER — Other Ambulatory Visit: Payer: Self-pay | Admitting: Pain Medicine

## 2020-09-12 DIAGNOSIS — M545 Low back pain, unspecified: Secondary | ICD-10-CM

## 2020-09-23 DIAGNOSIS — R200 Anesthesia of skin: Secondary | ICD-10-CM | POA: Diagnosis not present

## 2020-09-23 DIAGNOSIS — M5417 Radiculopathy, lumbosacral region: Secondary | ICD-10-CM | POA: Diagnosis not present

## 2020-09-24 ENCOUNTER — Encounter: Payer: Self-pay | Admitting: Medical

## 2020-09-30 DIAGNOSIS — M19019 Primary osteoarthritis, unspecified shoulder: Secondary | ICD-10-CM | POA: Diagnosis not present

## 2020-09-30 DIAGNOSIS — M67813 Other specified disorders of tendon, right shoulder: Secondary | ICD-10-CM | POA: Diagnosis not present

## 2020-10-07 DIAGNOSIS — G894 Chronic pain syndrome: Secondary | ICD-10-CM | POA: Diagnosis not present

## 2020-10-07 DIAGNOSIS — M4727 Other spondylosis with radiculopathy, lumbosacral region: Secondary | ICD-10-CM | POA: Diagnosis not present

## 2020-10-07 DIAGNOSIS — Z79891 Long term (current) use of opiate analgesic: Secondary | ICD-10-CM | POA: Diagnosis not present

## 2020-10-07 DIAGNOSIS — M25519 Pain in unspecified shoulder: Secondary | ICD-10-CM | POA: Diagnosis not present

## 2020-10-10 ENCOUNTER — Other Ambulatory Visit: Payer: Self-pay

## 2020-10-10 ENCOUNTER — Ambulatory Visit
Admission: RE | Admit: 2020-10-10 | Discharge: 2020-10-10 | Disposition: A | Discharge status: Home / Self Care | Payer: Medicare HMO | Source: Ambulatory Visit | Attending: Pain Medicine | Admitting: Pain Medicine

## 2020-10-10 DIAGNOSIS — M48061 Spinal stenosis, lumbar region without neurogenic claudication: Secondary | ICD-10-CM | POA: Diagnosis not present

## 2020-10-10 DIAGNOSIS — M545 Low back pain, unspecified: Secondary | ICD-10-CM

## 2020-11-25 ENCOUNTER — Encounter: Payer: Self-pay | Admitting: Internal Medicine

## 2020-11-25 DIAGNOSIS — Z90710 Acquired absence of both cervix and uterus: Secondary | ICD-10-CM | POA: Insufficient documentation

## 2020-11-26 ENCOUNTER — Encounter: Payer: Self-pay | Admitting: Internal Medicine

## 2020-12-05 ENCOUNTER — Telehealth: Payer: Self-pay | Admitting: Medical

## 2020-12-05 NOTE — Telephone Encounter (Signed)
Left message for patient to call back and schedule Medicare Annual Wellness Visit (AWV) to be done virtually or by telephone.  No hx of AWV eligible as of 05/14/14  Please schedule at anytime with PFM-Nurse Health Advisor.        Any questions, please call me at [phone]

## 2020-12-08 ENCOUNTER — Telehealth: Payer: Self-pay | Admitting: Medical

## 2020-12-08 DIAGNOSIS — M4727 Other spondylosis with radiculopathy, lumbosacral region: Secondary | ICD-10-CM | POA: Diagnosis not present

## 2020-12-08 DIAGNOSIS — M7918 Myalgia, other site: Secondary | ICD-10-CM | POA: Diagnosis not present

## 2020-12-08 DIAGNOSIS — G894 Chronic pain syndrome: Secondary | ICD-10-CM | POA: Diagnosis not present

## 2020-12-08 DIAGNOSIS — M25519 Pain in unspecified shoulder: Secondary | ICD-10-CM | POA: Diagnosis not present

## 2020-12-08 MED ORDER — ATORVASTATIN CALCIUM 40 MG PO TABS: 40.0000 mg | ORAL_TABLET | Freq: Every day | ORAL | 2 refills | Status: DC

## 2020-12-08 NOTE — Telephone Encounter (Signed)
Pt called for refills of lipitor. We have never filled that for her.it is on med list.  She states she had plenty when she moved here but now needs it refills to [facility] [address]

## 2020-12-08 NOTE — Telephone Encounter (Signed)
done

## 2020-12-10 ENCOUNTER — Telehealth: Payer: Self-pay | Admitting: Internal Medicine

## 2020-12-10 NOTE — Telephone Encounter (Signed)
Tried to call pt to see if she has had a colonoscopy yet so It can be documented into her chart

## 2020-12-12 ENCOUNTER — Other Ambulatory Visit: Payer: Self-pay | Admitting: Medical

## 2020-12-12 ENCOUNTER — Telehealth: Payer: Self-pay

## 2020-12-12 MED ORDER — PREGABALIN 150 MG PO CAPS: ORAL_CAPSULE | ORAL | 0 refills | Status: DC

## 2020-12-12 NOTE — Telephone Encounter (Signed)
Pt. Called stating that she needs a refill on her lyrica she only has to pills left will run out of it today so really needs it refilled today. Last apt 09/04/20.

## 2020-12-18 ENCOUNTER — Ambulatory Visit: Payer: Medicare HMO | Admitting: Neurology

## 2020-12-18 ENCOUNTER — Telehealth: Payer: Self-pay | Admitting: Neurology

## 2020-12-18 NOTE — Telephone Encounter (Signed)
Patient was already aware of this per neurology

## 2020-12-18 NOTE — Progress Notes (Deleted)
[facility]    Provider:  Dr Lucia Gaskins Requesting Provider: Aleen Campi Kermit Balo, PA-C Primary Care Provider:  Jac Canavan, PA-C  CC:  ***  HPI:  Desiree Thomas is a 60 y.o. female here as requested by Jac Canavan, PA-C for chronic neck pain, paresthesias and pain of upper extremities and migraines.  Patient states she requested neurology for migraines.  She has already been evaluated for her neck pain and upper extremity pain at [facility] orth[facility].  She has a past medical history of chronic back and neck pain status post lumbar spine surgery fusion, depression, hyperlipidemia, hypertension and migraine.  Per review of Dr. Adelene Idler notes, she is established at [facility], prior care at [facility] and is established with pain management.  Per chart review, she did not care for the orthopedist bed [place] at [facility], she ended up having an MRI of the right shoulder and neck, and there was not any specific plan on how to move forward and does not have follow-up with them, she continues to have quite a bit of pain in her right upper arm and chest wall and the pain seems to radiate into the breast but mostly concentrated on the right upper arm and shoulder, she does at times get tingly and weak in the right arm.  She has migraines, she uses Tylenol and if that does not help she goes to Maxalt or Seroquel, she requested neurology for migraines.  Reviewed notes, labs and imaging from outside physicians, which showed ***  I reviewed Dr. Adelene Idler notes, patient is a current smoker, she has chronic bilateral low back pain without sciatica, chronic neck pain, depression, right arm pain in addition to past medical history above in HPI.  She is on disability for history of chronic back pain, low back surgery and is on pain management and takes Lyrica 3 times a day and tramadol fairly regularly and does see a pain clinic.  Review of Systems: Patient  complains of symptoms per HPI as well as the following symptoms ***. Pertinent negatives and positives per HPI. All others negative.   Social History   Socioeconomic History   Marital status: Married    Spouse name: Not on file   Number of children: Not on file   Years of education: Not on file   Highest education level: Not on file  Occupational History   Not on file  Tobacco Use   Smoking status: Every Day   Smokeless tobacco: Never   Tobacco comments:    Since 2012  Substance and Sexual Activity   Alcohol use: Yes    Comment: seldom   Drug use: Yes    Types: Marijuana   Sexual activity: Not on file  Other Topics Concern   Not on file  Social History Narrative   Married, Christian.  Disabled.  Has 2 children .  08/2020   Social Determinants of Health   Financial Resource Strain: Not on file  Food Insecurity: Not on file  Transportation Needs: Not on file  Physical Activity: Not on file  Stress: Not on file  Social Connections: Not on file  Intimate Partner Violence: Not on file    Family History  Problem Relation Age of Onset   Arthritis Mother    Heart disease Mother    Asthma Mother    Diverticulitis Mother    Diabetes Father    Diverticulitis Father    Arthritis Sister    Asthma Sister  Arthritis Sister    Asthma Sister    Cancer Maternal Grandmother        stomach   Diabetes Paternal Grandmother    Dementia Paternal Grandmother     Past Medical History:  Diagnosis Date   Allergy    Asthma    Chronic back pain    Depression    GERD (gastroesophageal reflux disease)    Hearing loss    Hyperlipidemia    Hypertension    Migraine     Patient Active Problem List   Diagnosis Date Noted   H/O: hysterectomy 11/25/2020   Encounter for health maintenance examination in adult 09/04/2020   Vitamin D deficiency 09/04/2020   Encounter for hepatitis C screening test for low risk patient 09/04/2020   Essential hypertension, benign 09/04/2020   Need  for Td vaccine 09/04/2020   Chronic bilateral low back pain without sciatica 09/04/2020   Chronic neck pain 09/04/2020   Right arm pain 09/04/2020   Depression, major, single episode, in partial remission (HCC) 09/04/2020   Overactive bladder 09/04/2020   Smoker 06/12/2020   Gastroesophageal reflux disease 06/12/2020   Insomnia 06/12/2020   Migraine 06/12/2020   Nausea 06/12/2020   Hyperlipidemia 06/12/2020   Paresthesia and pain of both upper extremities 06/12/2020    Past Surgical History:  Procedure Laterality Date   ABDOMINAL HYSTERECTOMY     still has ovaries   APPENDECTOMY     COLONOSCOPY  2019   KNEE SURGERY  02/2013   KNEE SURGERY Bilateral    arthroscopy   LUMBAR FUSION  02/2014   LUMBAR SPINE SURGERY     fusion    Current Outpatient Medications  Medication Sig Dispense Refill   amLODipine (NORVASC) 10 MG tablet      atorvastatin (LIPITOR) 40 MG tablet Take 1 tablet (40 mg total) by mouth daily. 30 tablet 2   b complex vitamins capsule Take 1 capsule by mouth daily.     COLLAGEN PO Take by mouth.     esomeprazole (NEXIUM) 40 MG capsule      FLUoxetine (PROZAC) 20 MG capsule Take 40 mg by mouth daily.     magnesium gluconate (MAGONATE) 500 MG tablet Take 500 mg by mouth daily.     methocarbamol (ROBAXIN) 750 MG tablet Take 750 mg by mouth every 8 (eight) hours as needed. Takes prn day , but QHS daily     ondansetron (ZOFRAN-ODT) 8 MG disintegrating tablet Take 8 mg by mouth every 8 (eight) hours as needed for nausea or vomiting.     oxybutynin (DITROPAN) 5 MG tablet Take 5 mg by mouth 2 (two) times daily.     pregabalin (LYRICA) 150 MG capsule TAKE 1 CAPSULE BY MOUTH EVERY MORNING, AFTERNOON AND EVENING. 90 capsule 0   propranolol ER (INDERAL LA) 60 MG 24 hr capsule Take 60 mg by mouth at bedtime.     QUEtiapine (SEROQUEL) 25 MG tablet Take 25 mg by mouth as needed.     rizatriptan (MAXALT) 5 MG tablet Take 5 mg by mouth as needed for migraine. May repeat in 2  hours if needed     traMADol (ULTRAM) 50 MG tablet Take by mouth every 6 (six) hours as needed.     traZODone (DESYREL) 50 MG tablet Take 100 mg by mouth at bedtime.     Vitamin D, Ergocalciferol, (DRISDOL) 1.25 MG (50000 UNIT) CAPS capsule Take 1 capsule (50,000 Units total) by mouth every 7 (seven) days. 12 capsule 3   No  current facility-administered medications for this visit.    Allergies as of 12/18/2020 - Review Complete 09/04/2020  Allergen Reaction Noted   Morphine and related Anaphylaxis 06/12/2020   Penicillins Hives 06/12/2020   Vicodin hp [hydrocodone-acetaminophen] Itching 06/12/2020    Vitals: There were no vitals taken for this visit. Last Weight:  Wt Readings from Last 1 Encounters:  09/04/20 150 lb 9.6 oz (68.3 kg)   Last Height:   Ht Readings from Last 1 Encounters:  09/04/20 5\' 2"  (1.575 m)     Physical exam: Exam: Gen: NAD, conversant, well nourised, obese, well groomed                     CV: RRR, no MRG. No Carotid Bruits. No peripheral edema, warm, nontender Eyes: Conjunctivae clear without exudates or hemorrhage  Neuro: Detailed Neurologic Exam  Speech:    Speech is normal; fluent and spontaneous with normal comprehension.  Cognition:    The patient is oriented to person, place, and time;     recent and remote memory intact;     language fluent;     normal attention, concentration,     fund of knowledge Cranial Nerves:    The pupils are equal, round, and reactive to light. The fundi are normal and spontaneous venous pulsations are present. Visual fields are full to finger confrontation. Extraocular movements are intact. Trigeminal sensation is intact and the muscles of mastication are normal. The face is symmetric. The palate elevates in the midline. Hearing intact. Voice is normal. Shoulder shrug is normal. The tongue has normal motion without fasciculations.   Coordination:    Normal finger to nose and heel to shin. Normal rapid alternating  movements.   Gait:    Heel-toe and tandem gait are normal.   Motor Observation:    No asymmetry, no atrophy, and no involuntary movements noted. Tone:    Normal muscle tone.    Posture:    Posture is normal. normal erect    Strength:    Strength is V/V in the upper and lower limbs.      Sensation: intact to LT     Reflex Exam:  DTR's:    Deep tendon reflexes in the upper and lower extremities are normal bilaterally.   Toes:    The toes are downgoing bilaterally.   Clonus:    Clonus is absent.    Assessment/Plan:    No orders of the defined types were placed in this encounter.  No orders of the defined types were placed in this encounter.   Cc: Tysinger, , PA-C,  Tysinger, Kermit Balo, PA-C  Kermit Balo, MD  [facility] [address] [place], [place]  Phone [phone] Fax [phone]

## 2020-12-18 NOTE — Telephone Encounter (Signed)
Percell Belt or Maggie: Patient no showed her new patient appointment in neurology.  If she calls back to reschedule please discuss with me further before scheduling.  We have many patients in the queue awaiting appointments some of them quite sick and so I would like to make sure she will show up on time to her next appointment if she wants to reschedule (she has a 17% no-show rate).  Also another no-show or cancellation may have her dismissed from our practice per office policy.  She was referred for multiple conditions including neck pain, pain in the right arm, paresthesias of the upper extremities, and migraines and she has already been to [facility] for her neck and arm pain and apparently did not like their bedside manner.  I will see her for migraines as we cannot address multiple issues all at one appointment but again if she calls please discuss with me first as I would like to make it clear to her that if she makes an appointment it is important that she shows up.  I am CCing her primary care Crosby Oyster as an fyi for future referrals to neurology.

## 2020-12-22 ENCOUNTER — Other Ambulatory Visit: Payer: Self-pay

## 2020-12-22 ENCOUNTER — Encounter: Payer: Self-pay | Admitting: Neurology

## 2020-12-22 ENCOUNTER — Ambulatory Visit: Payer: Medicare HMO | Admitting: Neurology

## 2020-12-22 VITALS — BP 115/70 | HR 52 | Ht 62.0 in | Wt 148.1 lb

## 2020-12-22 DIAGNOSIS — R519 Headache, unspecified: Secondary | ICD-10-CM

## 2020-12-22 DIAGNOSIS — R0681 Apnea, not elsewhere classified: Secondary | ICD-10-CM

## 2020-12-22 DIAGNOSIS — G43711 Chronic migraine without aura, intractable, with status migrainosus: Secondary | ICD-10-CM

## 2020-12-22 MED ORDER — UBRELVY 100 MG PO TABS: 100.0000 mg | ORAL_TABLET | ORAL | 11 refills | Status: DC | PRN

## 2020-12-22 MED ORDER — EMGALITY 120 MG/ML ~~LOC~~ SOAJ: 120.0000 mg | SUBCUTANEOUS | 11 refills | Status: DC

## 2020-12-22 NOTE — Addendum Note (Signed)
Addended by: Naomie Dean B on: 12/22/2020 08:31 AM   Modules accepted: Orders

## 2020-12-22 NOTE — Progress Notes (Signed)
[facility]    Provider:  Dr Lucia Gaskins Requesting Provider: Aleen Campi Kermit Balo, PA-C Primary Care Provider:  Jac Canavan, PA-C  CC:  Migraines  HPI:  Roselyne Stalnaker is a 60 y.o. female here as requested by Jac Canavan, PA-C for chronic neck pain, paresthesias and pain of upper extremities and migraines.  Patient states she requested neurology for migraines.  She has already been evaluated for her neck pain and upper extremity pain at [facility] orthopedics.  She has a past medical history of chronic back and neck pain status post lumbar spine surgery fusion, depression, hyperlipidemia, hypertension and migraine.  Per review of Dr. Adelene Idler notes, she is established at [facility] orthopedics, prior care at [facility] and is established with pain management.  Per chart review, she did not care for the orthopedist bed [facility] at [facility], she ended up having an MRI of the right shoulder and neck, and there was not any specific plan on how to move forward and does not have follow-up with them, she continues to have quite a bit of pain in her right upper arm and chest wall and the pain seems to radiate into the breast but mostly concentrated on the right upper arm and shoulder, she does at times get tingly and weak in the right arm.  She has migraines, she uses Tylenol and if that does not help she goes to Maxalt or Seroquel, she requested neurology for migraines.  Patient is here and reports that she has had migraines for decades, she was extensively treated and imaged at the [facility] clinic for many years unfortunately I cannot access that through "Care Everywhere" and I will try to get those notes linked so I can review them, patient wakes up with them on the crown of the head into the neck throbbing pounding pulsating, can happen during the day as well it comes on acute, can last several hours for days, she went to the [facility] clinic for decades for  treatments, she tried Botox in the past and had a side effect but it sounds like it was more weakness in her neck which we can avoid and I did encourage her to consider it again, she has a history of meningitis in her late 30s, migraines started worsening again with menopausal symptoms and now she is postmenopausal, patient has morning headaches, and at least 16 migraine days a month that can be moderate to severe, Lyrica helps, she has a lot of spine pain, she is also had extensive "nerve testing" at the [facility] clinic, she has had cervical fusion, she has severe pain, migraines can last days and treated and be 10 out of 10, no medication overuse, no aura, she has photophobia and phonophobia and severe nausea and can vomit, she went to this extensive migraine clinic which was a 4-week 40 hours a week class and migraines outpatient full-time learning how to deal with migraines which include dietitians and dealing with mental illness.  Luckily she has not used CGRP, she is tried multiple other medications, I darkroom help, she has extensive family history and the mother sister are not 1 child with migraines, unknown triggers, but her stomach is always upset she has nausea, she has constipation, and associated vertigo.    Reviewed notes, labs and imaging from outside physicians, which showed:  Medications that she is tried and failed include the following, not an inclusive list however,: Amitriptyline with weight gain, Topamax with cognitive issues, she was on verapamil  for more than 20 years, she is on propranolol, amlodipine, she is tried Cymbalta, gabapentin, propranolol, Prozac, muscle relaxer such as Flexeril and methocarbamol, sumatriptan, rizatriptan, frovatriptan, almotriptan, multiple other triptans and various formats including p.o., injections, nasal sprays.  Once I get access to her [facility] clinic records I will know more.  Patient has an extensive history at the [facility] clinic unfortunately  I cannot access those records, she spent years they are having migraine treatment, she was even involved in full-time 40 hours a week outpatient migraine therapy course.  I will see if we can get her [facility] clinic records linked into my chart so I can review them, she states she has had extensive imaging including her brain, we will hold off on any brain imaging until I get those records  I reviewed Dr. Adelene Idler notes, patient is a current smoker, she has chronic bilateral low back pain without sciatica, chronic neck pain, depression, right arm pain in addition to past medical history above in HPI.  She is on disability for history of chronic back pain, low back surgery and is on pain management and takes Lyrica 3 times a day and tramadol fairly regularly and does see a pain clinic.  Review of Systems: Patient complains of symptoms per HPI as well as the following symptoms chronic pain. Pertinent negatives and positives per HPI. All others negative.   Social History   Socioeconomic History   Marital status: Married    Spouse name: Not on file   Number of children: Not on file   Years of education: Not on file   Highest education level: Not on file  Occupational History   Not on file  Tobacco Use   Smoking status: Every Day    Packs/day: 0.50    Types: Cigarettes   Smokeless tobacco: Never   Tobacco comments:    Since 2012  Vaping Use   Vaping Use: Some days  Substance and Sexual Activity   Alcohol use: Yes    Comment: seldom   Drug use: Yes    Types: Marijuana   Sexual activity: Not on file  Other Topics Concern   Not on file  Social History Narrative   Married, Christian.  Disabled.  Has 2 children .  08/2020   Social Determinants of Health   Financial Resource Strain: Not on file  Food Insecurity: Not on file  Transportation Needs: Not on file  Physical Activity: Not on file  Stress: Not on file  Social Connections: Not on file  Intimate Partner Violence: Not on  file    Family History  Problem Relation Age of Onset   Arthritis Mother    Heart disease Mother    Asthma Mother    Diverticulitis Mother    Migraines Mother    Diabetes Father    Diverticulitis Father    Arthritis Sister    Asthma Sister    Migraines Sister    Arthritis Sister    Asthma Sister    Cancer Maternal Grandmother        stomach   Diabetes Paternal Grandmother    Dementia Paternal Grandmother    Migraines Daughter     Past Medical History:  Diagnosis Date   Allergy    Asthma    Chronic back pain    Depression    GERD (gastroesophageal reflux disease)    Hearing loss    Hyperlipidemia    Hypertension    Migraine     Patient Active Problem List  Diagnosis Date Noted   Chronic migraine without aura, with intractable migraine, so stated, with status migrainosus 12/22/2020   H/O: hysterectomy 11/25/2020   Encounter for health maintenance examination in adult 09/04/2020   Vitamin D deficiency 09/04/2020   Encounter for hepatitis C screening test for low risk patient 09/04/2020   Essential hypertension, benign 09/04/2020   Need for Td vaccine 09/04/2020   Chronic bilateral low back pain without sciatica 09/04/2020   Chronic neck pain 09/04/2020   Right arm pain 09/04/2020   Depression, major, single episode, in partial remission (HCC) 09/04/2020   Overactive bladder 09/04/2020   Smoker 06/12/2020   Gastroesophageal reflux disease 06/12/2020   Insomnia 06/12/2020   Migraine 06/12/2020   Nausea 06/12/2020   Hyperlipidemia 06/12/2020   Paresthesia and pain of both upper extremities 06/12/2020    Past Surgical History:  Procedure Laterality Date   ABDOMINAL HYSTERECTOMY     still has ovaries   APPENDECTOMY     COLONOSCOPY  2019   KNEE SURGERY  02/2013   KNEE SURGERY Bilateral    arthroscopy   LUMBAR FUSION  02/2014   LUMBAR SPINE SURGERY     fusion    Current Outpatient Medications  Medication Sig Dispense Refill   amLODipine (NORVASC) 10  MG tablet      atorvastatin (LIPITOR) 40 MG tablet Take 1 tablet (40 mg total) by mouth daily. 30 tablet 2   esomeprazole (NEXIUM) 40 MG capsule      FLUoxetine (PROZAC) 20 MG capsule Take 40 mg by mouth daily.     Galcanezumab-gnlm (EMGALITY) 120 MG/ML SOAJ Inject 120 mg into the skin every 30 (thirty) days. 1.12 mL 11   magnesium gluconate (MAGONATE) 500 MG tablet Take 500 mg by mouth daily.     methocarbamol (ROBAXIN) 750 MG tablet Take 750 mg by mouth every 8 (eight) hours as needed. Takes prn day , but QHS daily     ondansetron (ZOFRAN-ODT) 8 MG disintegrating tablet Take 8 mg by mouth every 8 (eight) hours as needed for nausea or vomiting.     oxybutynin (DITROPAN) 5 MG tablet Take 5 mg by mouth 2 (two) times daily.     OXYCODONE ER PO Take by mouth as needed.     pregabalin (LYRICA) 150 MG capsule TAKE 1 CAPSULE BY MOUTH EVERY MORNING, AFTERNOON AND EVENING. 90 capsule 0   propranolol ER (INDERAL LA) 60 MG 24 hr capsule Take 60 mg by mouth at bedtime.     QUEtiapine (SEROQUEL) 25 MG tablet Take 25 mg by mouth as needed.     rizatriptan (MAXALT) 5 MG tablet Take 5 mg by mouth as needed for migraine. May repeat in 2 hours if needed     traZODone (DESYREL) 50 MG tablet Take 100 mg by mouth at bedtime.     Vitamin D, Ergocalciferol, (DRISDOL) 1.25 MG (50000 UNIT) CAPS capsule Take 1 capsule (50,000 Units total) by mouth every 7 (seven) days. 12 capsule 3   traMADol (ULTRAM) 50 MG tablet Take by mouth every 6 (six) hours as needed.     No current facility-administered medications for this visit.    Allergies as of 12/22/2020 - Review Complete 12/22/2020  Allergen Reaction Noted   Morphine and related Anaphylaxis 06/12/2020   Penicillins Hives 06/12/2020   Vicodin hp [hydrocodone-acetaminophen] Itching 06/12/2020    Vitals: BP 115/70   Pulse (!) 52   Ht 5\' 2"  (1.575 m)   Wt 148 lb 1.6 oz (67.2 kg)   BMI  27.09 kg/m  Last Weight:  Wt Readings from Last 1 Encounters:  12/22/20 148  lb 1.6 oz (67.2 kg)   Last Height:   Ht Readings from Last 1 Encounters:  12/22/20 5\' 2"  (1.575 m)     Physical exam: Exam: Gen: NAD, conversant, well nourised, well groomed                     CV: RRR, no MRG. No Carotid Bruits. No peripheral edema, warm, nontender Eyes: Conjunctivae clear without exudates or hemorrhage  Neuro: Detailed Neurologic Exam  Speech:    Speech is normal; fluent and spontaneous with normal comprehension.  Cognition:    The patient is oriented to person, place, and time;     recent and remote memory intact;     language fluent;     normal attention, concentration,     fund of knowledge Cranial Nerves:    The pupils are equal, round, and reactive to light. The fundi are flat. Visual fields are full to threat. Extraocular movements are intact. Trigeminal sensation is intact and the muscles of mastication are normal. The face is symmetric. The palate elevates in the midline. Hearing intact. Voice is normal. Shoulder shrug is normal. The tongue has normal motion without fasciculations.   Coordination:    Normal   Gait: normal.   Motor Observation:    No asymmetry, no atrophy, and no involuntary movements noted. Tone:    Normal muscle tone.    Posture:    Posture is normal. normal erect    Strength:    Moving all limbs equally no focal deficit notes     Sensation: intact to LT     Reflex Exam:  DTR's:    Deep tendon reflexes in the upper and lower extremities are symmetrical  bilaterally.   Toes:    The toes are downgoing bilaterally.   Clonus:    Clonus is absent.    Assessment/Plan: Patient with chronic intractable migraines.Patient has an extensive history at the [facility] clinic unfortunately I cannot access those records, she spent years they are having migraine treatment, she was even involved in full-time 40 hours a week outpatient migraine therapy course.  I will see if we can get her [facility] clinic records linked into my chart  so I can review them on "care everywhere", she states she has had extensive imaging including her brain, we will hold off on any brain imaging until I get those records. Failed plethora of medications likely we have some new medication she has not tried.  Preventative: We will start Emgality Acute management: We will try Ubrelvy   Medications that she has tried and failed include the following, not an inclusive list however,: Amitriptyline with weight gain, Topamax with cognitive issues, she was on verapamil for more than 20 years, she is on propranolol, amlodipine, she is tried Cymbalta, gabapentin, propranolol, Prozac, muscle relaxer such as Flexeril and methocarbamol, sumatriptan, rizatriptan, frovatriptan, almotriptan, multiple other triptans and various formats including p.o., injections, botox had neck weakness, nasal sprays.  Once I get access to her [facility] clinic records I will know more.  Discussed: To prevent or relieve headaches, try the following: Cool Compress. Lie down and place a cool compress on your head.  Avoid headache triggers. If certain foods or odors seem to have triggered your migraines in the past, avoid them. A headache diary might help you identify triggers.  Include physical activity in your daily routine. Try a daily walk or  other moderate aerobic exercise.  Manage stress. Find healthy ways to cope with the stressors, such as delegating tasks on your to-do list.  Practice relaxation techniques. Try deep breathing, yoga, massage and visualization.  Eat regularly. Eating regularly scheduled meals and maintaining a healthy diet might help prevent headaches. Also, drink plenty of fluids.  Follow a regular sleep schedule. Sleep deprivation might contribute to headaches Consider biofeedback. With this mind-body technique, you learn to control certain bodily functions -- such as muscle tension, heart rate and blood pressure -- to prevent headaches or reduce headache pain.     Proceed to emergency room if you experience new or worsening symptoms or symptoms do not resolve, if you have new neurologic symptoms or if headache is severe, or for any concerning symptom.   Provided education and documentation from [employer] headache [employer] toolbox including articles on: chronic migraine medication overuse headache, chronic migraines, prevention of migraines, behavioral and other nonpharmacologic treatments for headache.    Orders Placed This Encounter  Procedures   Ambulatory referral to Sleep Studies    Meds ordered this encounter  Medications   Galcanezumab-gnlm (EMGALITY) 120 MG/ML SOAJ    Sig: Inject 120 mg into the skin every 30 (thirty) days.    Dispense:  1.12 mL    Refill:  11     Cc: Tysinger, Kermit Balo, PA-C,  Tysinger, Kermit Balo, PA-C  Naomie Dean, MD  [facility] [address] [place], [place] [address]  Phone [phone] Fax [phone]  I spent over 60 minutes of face-to-face and non-face-to-face time with patient on the  1. Chronic migraine without aura, with intractable migraine, so stated, with status migrainosus   2. Morning headache   3. Witnessed apneic spells    diagnosis.  This included previsit chart review, lab review, study review, order entry, electronic health record documentation, patient education on the different diagnostic and therapeutic options, counseling and coordination of care, risks and benefits of management, compliance, or risk factor reduction

## 2020-12-22 NOTE — Patient Instructions (Addendum)
Start Emgality once monthly prevention Ubrelvy: Please take one table at the onset of your headache. If it does not improve the symptoms please take one additional tablet. Do not take more then 2 tablets in 24hrs. Do not take use more then 2 to 3 times in a week. Can use with anti-nausea medication. May take with Rizatriptan and Ondansetron. Morning headaches, coughs and snores in sleep: sleep evaluation  Sleep Apnea Sleep apnea affects breathing during sleep. It causes breathing to stop for 10 seconds or more, or to become shallow. People with sleep apnea usually snore loudly. It can also increase the risk of: Heart attack. Stroke. Being very overweight (obese). Diabetes. Heart failure. Irregular heartbeat. High blood pressure. The goal of treatment is to help you breathe normally again. What are the causes? The most common cause of this condition is a collapsed or blocked airway. There are three kinds of sleep apnea: Obstructive sleep apnea. This is caused by a blocked or collapsed airway. Central sleep apnea. This happens when the brain does not send the right signals to the muscles that control breathing. Mixed sleep apnea. This is a combination of obstructive and central sleep apnea. What increases the risk? Being overweight. Smoking. Having a small airway. Being older. Being female. Drinking alcohol. Taking medicines to calm yourself (sedatives or tranquilizers). Having family members with the condition. Having a tongue or tonsils that are larger than normal. What are the signs or symptoms? Trouble staying asleep. Loud snoring. Headaches in the morning. Waking up gasping. Dry mouth or sore throat in the morning. Being sleepy or tired during the day. If you are sleepy or tired during the day, you may also: Not be able to focus your mind (concentrate). Forget things. Get angry a lot and have mood swings. Feel sad (depressed). Have changes in your personality. Have less  interest in sex, if you are female. Be unable to have an erection, if you are female. How is this treated?  Sleeping on your side. Using a medicine to get rid of mucus in your nose (decongestant). Avoiding the use of alcohol, medicines to help you relax, or certain pain medicines (narcotics). Losing weight, if needed. Changing your diet. Quitting smoking. Using a machine to open your airway while you sleep, such as: An oral appliance. This is a mouthpiece that shifts your lower jaw forward. A CPAP device. This device blows air through a mask when you breathe out (exhale). An EPAP device. This has valves that you put in each nostril. A BPAP device. This device blows air through a mask when you breathe in (inhale) and breathe out. Having surgery if other treatments do not work. Follow these instructions at home: Lifestyle Make changes that your doctor recommends. Eat a healthy diet. Lose weight if needed. Avoid alcohol, medicines to help you relax, and some pain medicines. Do not smoke or use any products that contain nicotine or tobacco. If you need help quitting, ask your doctor. General instructions Take over-the-counter and prescription medicines only as told by your doctor. If you were given a machine to use while you sleep, use it only as told by your doctor. If you are having surgery, make sure to tell your doctor you have sleep apnea. You may need to bring your device with you. Keep all follow-up visits. Contact a doctor if: The machine that you were given to use during sleep bothers you or does not seem to be working. You do not get better. You get worse. Get  help right away if: Your chest hurts. You have trouble breathing in enough air. You have an uncomfortable feeling in your back, arms, or stomach. You have trouble talking. One side of your body feels weak. A part of your face is hanging down. These symptoms may be an emergency. Get help right away. Call your local  emergency services (911 in the [place] Do not wait to see if the symptoms will go away. Do not drive yourself to the hospital. Summary This condition affects breathing during sleep. The most common cause is a collapsed or blocked airway. The goal of treatment is to help you breathe normally while you sleep. This information is not intended to replace advice given to you by your health care provider. Make sure you discuss any questions you have with your health care provider. Document Revised: 02/08/2020 Document Reviewed: 02/08/2020 Elsevier Patient Education  2022 Elsevier Inc.   [place] injection What is this medication? GALCANEZUMAB (gal ka NEZ ue mab) is used to prevent migraines and treat cluster headaches. This medicine may be used for other purposes; ask your health care provider or pharmacist if you have questions. COMMON BRAND NAME(S): Emgality What should I tell my care team before I take this medication? They need to know if you have any of these conditions: an unusual or allergic reaction to galcanezumab, other medicines, foods, dyes, or preservatives pregnant or trying to get pregnant breast-feeding How should I use this medication? This medicine is for injection under the skin. You will be taught how to prepare and give this medicine. Use exactly as directed. Take your medicine at regular intervals. Do not take your medicine more often than directed. It is important that you put your used needles and syringes in a special sharps container. Do not put them in a trash can. If you do not have a sharps container, call your pharmacist or healthcare provider to get one. Talk to your pediatrician regarding the use of this medicine in children. Special care may be needed. Overdosage: If you think you have taken too much of this medicine contact a poison control center or emergency room at once. NOTE: This medicine is only for you. Do not share this medicine with others. What if  I miss a dose? If you miss a dose, take it as soon as you can. If it is almost time for your next dose, take only that dose. Do not take double or extra doses. What may interact with this medication? Interactions are not expected. This list may not describe all possible interactions. Give your health care provider a list of all the medicines, herbs, non-prescription drugs, or dietary supplements you use. Also tell them if you smoke, drink alcohol, or use illegal drugs. Some items may interact with your medicine. What should I watch for while using this medication? Tell your doctor or healthcare professional if your symptoms do not start to get better or if they get worse. What side effects may I notice from receiving this medication? Side effects that you should report to your doctor or health care professional as soon as possible: allergic reactions like skin rash, itching or hives, swelling of the face, lips, or tongue Side effects that usually do not require medical attention (report these to your doctor or health care professional if they continue or are bothersome): pain, redness, or irritation at site where injected This list may not describe all possible side effects. Call your doctor for medical advice about side effects. You may report  side effects to FDA at 1-800-FDA-1088. Where should I keep my medication? Keep out of the reach of children. You will be instructed on how to store this medicine. Throw away any unused medicine after the expiration date on the label. NOTE: This sheet is a summary. It may not cover all possible information. If you have questions about this medicine, talk to your doctor, pharmacist, or health care provider.  2022 Elsevier/Gold Standard (2017-08-17 12:03:23) Ubrogepant tablets What is this medication? UBROGEPANT (ue BROE je pant) is used to treat migraine headaches with or without aura. An aura is a strange feeling or visual disturbance that warns you of an  attack. It is not used to prevent migraines. This medicine may be used for other purposes; ask your health care provider or pharmacist if you have questions. COMMON BRAND NAME(S): Bernita Raisin What should I tell my care team before I take this medication? They need to know if you have any of these conditions: kidney disease liver disease an unusual or allergic reaction to ubrogepant, other medicines, foods, dyes, or preservatives pregnant or trying to get pregnant breast-feeding How should I use this medication? Take this medicine by mouth with a glass of water. Follow the directions on the prescription label. You can take it with or without food. If it upsets your stomach, take it with food. Take your medicine at regular intervals. Do not take it more often than directed. Do not stop taking except on your doctor's advice. Talk to your pediatrician about the use of this medicine in children. Special care may be needed. Overdosage: If you think you have taken too much of this medicine contact a poison control center or emergency room at once. NOTE: This medicine is only for you. Do not share this medicine with others. What if I miss a dose? This does not apply. This medicine is not for regular use. What may interact with this medication? Do not take this medicine with any of the following medicines: ceritinib certain antibiotics like chloramphenicol, clarithromycin, telithromycin certain antivirals for HIV like atazanavir, cobicistat, darunavir, delavirdine, fosamprenavir, indinavir, ritonavir certain medicines for fungal infections like itraconazole, ketoconazole, posaconazole, voriconazole conivaptan grapefruit idelalisib mifepristone nefazodone ribociclib This medicine may also interact with the following medications: carvedilol certain medicines for seizures like phenobarbital, phenytoin ciprofloxacin cyclosporine eltrombopag fluconazole fluvoxamine quinidine rifampin [facility]  wort verapamil This list may not describe all possible interactions. Give your health care provider a list of all the medicines, herbs, non-prescription drugs, or dietary supplements you use. Also tell them if you smoke, drink alcohol, or use illegal drugs. Some items may interact with your medicine. What should I watch for while using this medication? Visit your health care professional for regular checks on your progress. Tell your health care professional if your symptoms do not start to get better or if they get worse. Your mouth may get dry. Chewing sugarless gum or sucking hard candy and drinking plenty of water may help. Contact your health care professional if the problem does not go away or is severe. What side effects may I notice from receiving this medication? Side effects that you should report to your doctor or health care professional as soon as possible: allergic reactions like skin rash, itching or hives; swelling of the face, lips, or tongue Side effects that usually do not require medical attention (report these to your doctor or health care professional if they continue or are bothersome): drowsiness dry mouth nausea tiredness This list may not describe all  possible side effects. Call your doctor for medical advice about side effects. You may report side effects to FDA at 1-800-FDA-1088. Where should I keep my medication? Keep out of the reach of children. Store at room temperature between 15 and 30 degrees C (59 and 86 degrees F). Throw away any unused medicine after the expiration date. NOTE: This sheet is a summary. It may not cover all possible information. If you have questions about this medicine, talk to your doctor, pharmacist, or health care provider.  2022 Elsevier/Gold Standard (2018-05-18 08:50:55)

## 2020-12-24 ENCOUNTER — Telehealth: Payer: Self-pay

## 2020-12-24 NOTE — Telephone Encounter (Signed)
I submitted a PA request for Ubrelvy 100mg  on [facility], Key: [facility] - PA Case ID: [id number].   Awaiting determination

## 2020-12-24 NOTE — Telephone Encounter (Signed)
PA Case: [id number], Status: Approved, Coverage Starts on: 03/15/2020 12:00:00 AM, Coverage Ends on: 03/14/2022 12:00:00 AM. Questions? Contact [phone].

## 2020-12-25 ENCOUNTER — Telehealth: Payer: Self-pay | Admitting: *Deleted

## 2020-12-25 NOTE — Telephone Encounter (Signed)
[facility] Key: [id number] - PA Case ID: [id number] - Rx #: [id number]  Sent PA for Emgality and waiting for approval

## 2020-12-25 NOTE — Telephone Encounter (Signed)
Approvedtoday PA Case: [id number], Status: Approved, Coverage Starts on: 12/25/2020 12:00:00 AM, Coverage Ends on: 03/25/2021 12:00:00 AM. Questions? Contact [phone].  PA approved for Emgality will contact patient through mychart to make her aware of approval

## 2020-12-25 NOTE — Telephone Encounter (Signed)
We received the approval letter. I faxed it to the pt's pharmacy. Received a receipt of confirmation.

## 2021-01-05 DIAGNOSIS — G894 Chronic pain syndrome: Secondary | ICD-10-CM | POA: Diagnosis not present

## 2021-01-05 DIAGNOSIS — M25579 Pain in unspecified ankle and joints of unspecified foot: Secondary | ICD-10-CM | POA: Diagnosis not present

## 2021-01-05 DIAGNOSIS — M4727 Other spondylosis with radiculopathy, lumbosacral region: Secondary | ICD-10-CM | POA: Diagnosis not present

## 2021-01-05 DIAGNOSIS — M7918 Myalgia, other site: Secondary | ICD-10-CM | POA: Diagnosis not present

## 2021-01-06 ENCOUNTER — Telehealth: Payer: Self-pay

## 2021-01-06 NOTE — Telephone Encounter (Signed)
Patient left VM earlier for refills but did not say which refills she needed. Left pt a voicemail to call back and let us know what refills she needs

## 2021-01-08 ENCOUNTER — Other Ambulatory Visit: Payer: Self-pay | Admitting: Medical

## 2021-01-08 ENCOUNTER — Telehealth: Payer: Self-pay

## 2021-01-08 MED ORDER — PROPRANOLOL HCL ER 60 MG PO CP24: 60.0000 mg | ORAL_CAPSULE | Freq: Every day | ORAL | 1 refills | Status: DC

## 2021-01-08 MED ORDER — OXYBUTYNIN CHLORIDE 5 MG PO TABS: 5.0000 mg | ORAL_TABLET | Freq: Two times a day (BID) | ORAL | 2 refills | Status: DC

## 2021-01-08 MED ORDER — FLUOXETINE HCL 20 MG PO CAPS: 40.0000 mg | ORAL_CAPSULE | Freq: Every day | ORAL | 1 refills | Status: DC

## 2021-01-08 MED ORDER — METHOCARBAMOL 750 MG PO TABS: 750.0000 mg | ORAL_TABLET | Freq: Three times a day (TID) | ORAL | 0 refills | Status: DC | PRN

## 2021-01-08 MED ORDER — AMLODIPINE BESYLATE 10 MG PO TABS: 10.0000 mg | ORAL_TABLET | Freq: Every day | ORAL | 3 refills | Status: DC

## 2021-01-08 NOTE — Telephone Encounter (Addendum)
Pt left message would like refills on her Methocarbamol 750 mg, Amlodipine, Oxybutynin, Fluoxetine and Propranolol 60mg  to 

## 2021-01-09 ENCOUNTER — Other Ambulatory Visit: Payer: Self-pay

## 2021-01-09 ENCOUNTER — Other Ambulatory Visit: Payer: Self-pay | Admitting: Physician Assistant

## 2021-01-09 ENCOUNTER — Ambulatory Visit
Admission: RE | Admit: 2021-01-09 | Discharge: 2021-01-09 | Disposition: A | Discharge status: Home / Self Care | Payer: Medicare HMO | Source: Ambulatory Visit | Attending: Physician Assistant | Admitting: Physician Assistant

## 2021-01-09 DIAGNOSIS — M25572 Pain in left ankle and joints of left foot: Secondary | ICD-10-CM | POA: Diagnosis not present

## 2021-01-09 DIAGNOSIS — M25571 Pain in right ankle and joints of right foot: Secondary | ICD-10-CM

## 2021-01-12 ENCOUNTER — Other Ambulatory Visit: Payer: Self-pay | Admitting: Medical

## 2021-01-16 ENCOUNTER — Other Ambulatory Visit: Payer: Self-pay

## 2021-01-16 ENCOUNTER — Encounter (HOSPITAL_COMMUNITY): Payer: Self-pay

## 2021-01-16 ENCOUNTER — Ambulatory Visit (HOSPITAL_COMMUNITY)
Admission: EM | Admit: 2021-01-16 | Discharge: 2021-01-16 | Disposition: A | Discharge status: Home / Self Care | Payer: Medicare HMO | Attending: Physician Assistant | Admitting: Physician Assistant

## 2021-01-16 DIAGNOSIS — M79671 Pain in right foot: Secondary | ICD-10-CM

## 2021-01-16 DIAGNOSIS — M25571 Pain in right ankle and joints of right foot: Secondary | ICD-10-CM

## 2021-01-16 MED ORDER — NAPROXEN 500 MG PO TABS: 500.0000 mg | ORAL_TABLET | Freq: Two times a day (BID) | ORAL | 0 refills | Status: AC

## 2021-01-16 NOTE — Discharge Instructions (Signed)
Use brace to help manage symptoms.  Continue with elevation, compression, ice for additional symptom relief.  Take Naprosyn twice daily.  You should not take additional NSAIDs with this medication as it can cause stomach bleeding including ibuprofen, aspirin, naproxen/Aleve.  You can use Tylenol as well as your prescribed pain medication as needed.  If your symptoms or not improving quickly follow-up with podiatrist as we discussed.  If anything worsens suddenly please return for reevaluation.

## 2021-01-16 NOTE — ED Provider Notes (Signed)
[facility]    CSN: [id number] Arrival date & time: 01/16/21  0946      History   Chief Complaint Chief Complaint  Patient presents with   Ankle Pain    rt    HPI Desiree Thomas is a 60 y.o. female.   Patient presents today with a several week history of right medial ankle swelling and pain.  She denies any known or increase in activity prior to symptom onset.  She has tried Aspercreme, elevation, compression, supportive footwear without improvement of symptoms.  Reports symptoms are worsened with prolonged standing at her place of employment and she had to call out of work today due to pain.  She did have an x-ray with her pain management provider on 01/09/2021 that was normal.  She reports pain was severe this morning and she had difficulty placing weight on her foot due to pain until she taken several steps.  Currently pain is rated 4 on a 0-10 pain scale, localized to medial right ankle with radiation into foot, described as aching, no aggravating relieving factors identified.  Denies any numbness, weakness, inability bear weight.   Past Medical History:  Diagnosis Date   Allergy    Asthma    Chronic back pain    Depression    GERD (gastroesophageal reflux disease)    Hearing loss    Hyperlipidemia    Hypertension    Migraine     Patient Active Problem List   Diagnosis Date Noted   Chronic migraine without aura, with intractable migraine, so stated, with status migrainosus 12/22/2020   H/O: hysterectomy 11/25/2020   Encounter for health maintenance examination in adult 09/04/2020   Vitamin D deficiency 09/04/2020   Encounter for hepatitis C screening test for low risk patient 09/04/2020   Essential hypertension, benign 09/04/2020   Need for Td vaccine 09/04/2020   Chronic bilateral low back pain without sciatica 09/04/2020   Chronic neck pain 09/04/2020   Right arm pain 09/04/2020   Depression, major, single episode, in partial remission (HCC) 09/04/2020    Overactive bladder 09/04/2020   Smoker 06/12/2020   Gastroesophageal reflux disease 06/12/2020   Insomnia 06/12/2020   Migraine 06/12/2020   Nausea 06/12/2020   Hyperlipidemia 06/12/2020   Paresthesia and pain of both upper extremities 06/12/2020    Past Surgical History:  Procedure Laterality Date   ABDOMINAL HYSTERECTOMY     still has ovaries   APPENDECTOMY     COLONOSCOPY  2019   KNEE SURGERY  02/2013   KNEE SURGERY Bilateral    arthroscopy   LUMBAR FUSION  02/2014   LUMBAR SPINE SURGERY     fusion    OB History   No obstetric history on file.      Home Medications    Prior to Admission medications   Medication Sig Start Date End Date Taking? Authorizing Provider  naproxen (NAPROSYN) 500 MG tablet Take 1 tablet (500 mg total) by mouth 2 (two) times daily with a meal for 7 days. 01/16/21 01/23/21 Yes Ralphie Lovelady K, PA-C  amLODipine (NORVASC) 10 MG tablet Take 1 tablet (10 mg total) by mouth daily. 01/08/21   Tysinger, Kermit Balo, PA-C  atorvastatin (LIPITOR) 40 MG tablet TAKE 1 TABLET(40 MG) BY MOUTH DAILY 01/13/21   Tysinger, Kermit Balo, PA-C  esomeprazole (NEXIUM) 40 MG capsule  04/04/20   [provider]  FLUoxetine (PROZAC) 20 MG capsule Take 2 capsules (40 mg total) by mouth daily. 01/08/21   Tysinger, Kermit Balo,  PA-C  Galcanezumab-gnlm (EMGALITY) 120 MG/ML SOAJ Inject 120 mg into the skin every 30 (thirty) days. 12/22/20   Anson Fret, MD  magnesium gluconate (MAGONATE) 500 MG tablet Take 500 mg by mouth daily.    [provider]  methocarbamol (ROBAXIN) 750 MG tablet Take 1 tablet (750 mg total) by mouth every 8 (eight) hours as needed. Takes prn day , but QHS daily 01/08/21   Tysinger, Kermit Balo, PA-C  ondansetron (ZOFRAN-ODT) 8 MG disintegrating tablet Take 8 mg by mouth every 8 (eight) hours as needed for nausea or vomiting.    [provider]  oxybutynin (DITROPAN) 5 MG tablet Take 1 tablet (5 mg total) by mouth 2 (two) times daily.  01/08/21   Tysinger, Kermit Balo, PA-C  OXYCODONE ER PO Take by mouth as needed.    [provider]  pregabalin (LYRICA) 150 MG capsule TAKE 1 CAPSULE BY MOUTH EVERY MORNING, AFTERNOON AND EVENING. 12/12/20   Tysinger, Kermit Balo, PA-C  propranolol ER (INDERAL LA) 60 MG 24 hr capsule Take 1 capsule (60 mg total) by mouth at bedtime. 01/08/21   Tysinger, Kermit Balo, PA-C  QUEtiapine (SEROQUEL) 25 MG tablet Take 25 mg by mouth as needed.    [provider]  rizatriptan (MAXALT) 5 MG tablet Take 5 mg by mouth as needed for migraine. May repeat in 2 hours if needed    [provider]  traZODone (DESYREL) 50 MG tablet Take 100 mg by mouth at bedtime. 04/04/20   [provider]  Ubrogepant (UBRELVY) 100 MG TABS Take 100 mg by mouth every 2 (two) hours as needed. Maximum 200mg  a day. 12/22/20   02/21/21, MD  Vitamin D, Ergocalciferol, (DRISDOL) 1.25 MG (50000 UNIT) CAPS capsule Take 1 capsule (50,000 Units total) by mouth every 7 (seven) days. 09/05/20   Tysinger, 09/07/20, PA-C    Family History Family History  Problem Relation Age of Onset   Arthritis Mother    Heart disease Mother    Asthma Mother    Diverticulitis Mother    Migraines Mother    Diabetes Father    Diverticulitis Father    Arthritis Sister    Asthma Sister    Migraines Sister    Arthritis Sister    Asthma Sister    Cancer Maternal Grandmother        stomach   Diabetes Paternal Grandmother    Dementia Paternal Grandmother    Migraines Daughter     Social History Social History   Tobacco Use   Smoking status: Every Day    Packs/day: 0.50    Types: Cigarettes   Smokeless tobacco: Never   Tobacco comments:    Since 2012  Vaping Use   Vaping Use: Some days  Substance Use Topics   Alcohol use: Yes    Comment: seldom   Drug use: Yes    Types: Marijuana     Allergies   Morphine and related, Penicillins, and Vicodin hp [hydrocodone-acetaminophen]   Review of Systems Review of  Systems  Constitutional:  Positive for activity change. Negative for appetite change, fatigue and fever.  Respiratory:  Negative for cough and shortness of breath.   Cardiovascular:  Negative for chest pain.  Gastrointestinal:  Negative for abdominal pain, diarrhea, nausea and vomiting.  Musculoskeletal:  Positive for arthralgias and gait problem. Negative for joint swelling and myalgias.  Neurological:  Negative for dizziness, weakness, light-headedness, numbness and headaches.    Physical Exam Triage Vital Signs ED  Triage Vitals  Enc Vitals Group     BP 01/16/21 1158 (!) 152/78     Pulse Rate 01/16/21 1158 (!) 58     Resp 01/16/21 1158 14     Temp 01/16/21 1158 98.7 F (37.1 C)     Temp Source 01/16/21 1158 Oral     SpO2 01/16/21 1158 98 %     Weight --      Height --      Head Circumference --      Peak Flow --      Pain Score 01/16/21 1200 4     Pain Loc --      Pain Edu? --      Excl. in GC? --    No data found.  Updated Vital Signs BP (!) 152/78 (BP Location: Right Arm)   Pulse (!) 58   Temp 98.7 F (37.1 C) (Oral)   Resp 14   SpO2 98%   Visual Acuity Right Eye Distance:   Left Eye Distance:   Bilateral Distance:    Right Eye Near:   Left Eye Near:    Bilateral Near:     Physical Exam Vitals reviewed.  Constitutional:      General: She is awake. She is not in acute distress.    Appearance: Normal appearance. She is well-developed. She is not ill-appearing.     Comments: Very pleasant female appears stated age in no acute distress sitting comfortably in exam room  HENT:     Head: Normocephalic and atraumatic.  Cardiovascular:     Rate and Rhythm: Normal rate and regular rhythm.     Pulses:          Posterior tibial pulses are 2+ on the right side and 2+ on the left side.     Heart sounds: Normal heart sounds, S1 normal and S2 normal. No murmur heard.    Comments: Capillary refill within 2 seconds right toes Pulmonary:     Effort: Pulmonary effort is  normal.     Breath sounds: Normal breath sounds. No wheezing, rhonchi or rales.     Comments: Clear to auscultation bilaterally Musculoskeletal:     Right lower leg: No edema.     Left lower leg: No edema.     Right ankle: No swelling. Tenderness present over the medial malleolus. Decreased range of motion.     Right Achilles Tendon: No tenderness.     Comments: Right ankle: Tenderness palpation over medial malleolus and calcaneus into arch of foot.  No deformity noted.  Foot neurovascularly intact.  Psychiatric:        Behavior: Behavior is cooperative.     UC Treatments / Results  Labs (all labs ordered are listed, but only abnormal results are displayed) Labs Reviewed - No data to display  EKG   Radiology No results found.  Procedures Procedures (including critical care time)  Medications Ordered in UC Medications - No data to display  Initial Impression / Assessment and Plan / UC Course  I have reviewed the triage vital signs and the nursing notes.  Pertinent labs & imaging results that were available during my care of the patient were reviewed by me and considered in my medical decision making (see chart for details).     X-rays were not repeated as patient had normal x-rays within the past week and has not had any worsening or changing symptoms or known trauma.  Will place in brace for support and pain relief.  She  was encouraged to continue conservative treatment including elevation, compression, ice.  She was prescribed Naprosyn to be taken twice daily to help with pain and inflammation.  She was instructed to avoid NSAIDs with this medication due to risk of GI bleeding but can use Tylenol as well as prescribed pain medication for additional symptom relief.  She was provided a work excuse note as requested.  Recommended she follow-up with podiatry if symptoms or not improving quickly with conservative treatment and was given contact information for local provider.   Discussed alarm symptoms that warrant emergent evaluation.  Strict return precautions given to which she expressed understanding.  Final Clinical Impressions(s) / UC Diagnoses   Final diagnoses:  Acute right ankle pain  Right foot pain     Discharge Instructions      Use brace to help manage symptoms.  Continue with elevation, compression, ice for additional symptom relief.  Take Naprosyn twice daily.  You should not take additional NSAIDs with this medication as it can cause stomach bleeding including ibuprofen, aspirin, naproxen/Aleve.  You can use Tylenol as well as your prescribed pain medication as needed.  If your symptoms or not improving quickly follow-up with podiatrist as we discussed.  If anything worsens suddenly please return for reevaluation.     ED Prescriptions     Medication Sig Dispense Auth. Provider   naproxen (NAPROSYN) 500 MG tablet Take 1 tablet (500 mg total) by mouth 2 (two) times daily with a meal for 7 days. 14 tablet Paquita Printy, Noberto Retort, PA-C      PDMP not reviewed this encounter.   Jeani Hawking, PA-C 01/16/21 1240

## 2021-01-16 NOTE — ED Triage Notes (Signed)
Pt presents with rt ankle pain and swelling x 6 weeks. No known injury.

## 2021-01-17 ENCOUNTER — Other Ambulatory Visit: Payer: Self-pay | Admitting: Medical

## 2021-01-20 NOTE — Telephone Encounter (Signed)
Left message for pt to schedule med check appt

## 2021-01-21 ENCOUNTER — Telehealth: Payer: Self-pay | Admitting: *Deleted

## 2021-01-21 NOTE — Telephone Encounter (Signed)
Notes from [facility] r/c on [place] desk.

## 2021-01-21 NOTE — Telephone Encounter (Signed)
error 

## 2021-01-27 ENCOUNTER — Encounter: Payer: Self-pay | Admitting: Medical

## 2021-01-27 ENCOUNTER — Encounter: Payer: Medicare HMO | Admitting: Medical

## 2021-02-03 DIAGNOSIS — G894 Chronic pain syndrome: Secondary | ICD-10-CM | POA: Diagnosis not present

## 2021-02-03 DIAGNOSIS — M4727 Other spondylosis with radiculopathy, lumbosacral region: Secondary | ICD-10-CM | POA: Diagnosis not present

## 2021-02-03 DIAGNOSIS — Z79891 Long term (current) use of opiate analgesic: Secondary | ICD-10-CM | POA: Diagnosis not present

## 2021-02-03 DIAGNOSIS — M7918 Myalgia, other site: Secondary | ICD-10-CM | POA: Diagnosis not present

## 2021-02-03 DIAGNOSIS — M25579 Pain in unspecified ankle and joints of unspecified foot: Secondary | ICD-10-CM | POA: Diagnosis not present

## 2021-02-04 ENCOUNTER — Other Ambulatory Visit: Payer: Self-pay | Admitting: Medical

## 2021-02-04 NOTE — Telephone Encounter (Signed)
Pt was given #30 with 1 refill on 01/08/21

## 2021-02-07 ENCOUNTER — Other Ambulatory Visit: Payer: Self-pay | Admitting: Medical

## 2021-02-17 ENCOUNTER — Ambulatory Visit (INDEPENDENT_AMBULATORY_CARE_PROVIDER_SITE_OTHER): Payer: Medicare HMO | Admitting: Medical

## 2021-02-17 ENCOUNTER — Other Ambulatory Visit: Payer: Self-pay

## 2021-02-17 VITALS — BP 120/80 | HR 57 | Ht 63.0 in | Wt 145.8 lb

## 2021-02-17 DIAGNOSIS — E785 Hyperlipidemia, unspecified: Secondary | ICD-10-CM | POA: Diagnosis not present

## 2021-02-17 DIAGNOSIS — F17200 Nicotine dependence, unspecified, uncomplicated: Secondary | ICD-10-CM | POA: Diagnosis not present

## 2021-02-17 DIAGNOSIS — G43711 Chronic migraine without aura, intractable, with status migrainosus: Secondary | ICD-10-CM | POA: Diagnosis not present

## 2021-02-17 DIAGNOSIS — L989 Disorder of the skin and subcutaneous tissue, unspecified: Secondary | ICD-10-CM

## 2021-02-17 DIAGNOSIS — N3281 Overactive bladder: Secondary | ICD-10-CM | POA: Diagnosis not present

## 2021-02-17 DIAGNOSIS — I10 Essential (primary) hypertension: Secondary | ICD-10-CM

## 2021-02-17 DIAGNOSIS — M545 Low back pain, unspecified: Secondary | ICD-10-CM

## 2021-02-17 DIAGNOSIS — E559 Vitamin D deficiency, unspecified: Secondary | ICD-10-CM | POA: Diagnosis not present

## 2021-02-17 DIAGNOSIS — G8929 Other chronic pain: Secondary | ICD-10-CM

## 2021-02-17 DIAGNOSIS — G4700 Insomnia, unspecified: Secondary | ICD-10-CM | POA: Diagnosis not present

## 2021-02-17 DIAGNOSIS — G43809 Other migraine, not intractable, without status migrainosus: Secondary | ICD-10-CM | POA: Diagnosis not present

## 2021-02-17 DIAGNOSIS — M542 Cervicalgia: Secondary | ICD-10-CM | POA: Diagnosis not present

## 2021-02-17 DIAGNOSIS — L851 Acquired keratosis [keratoderma] palmaris et plantaris: Secondary | ICD-10-CM

## 2021-02-17 MED ORDER — SOLIFENACIN SUCCINATE 5 MG PO TABS: 5.0000 mg | ORAL_TABLET | Freq: Every day | ORAL | 2 refills | Status: DC

## 2021-02-17 MED ORDER — PREGABALIN 150 MG PO CAPS: ORAL_CAPSULE | ORAL | 5 refills | Status: DC

## 2021-02-17 MED ORDER — FLUOXETINE HCL 20 MG PO CAPS: 20.0000 mg | ORAL_CAPSULE | Freq: Every day | ORAL | 1 refills | Status: DC

## 2021-02-17 MED ORDER — METHOCARBAMOL 500 MG PO TABS: 500.0000 mg | ORAL_TABLET | Freq: Two times a day (BID) | ORAL | 2 refills | Status: DC

## 2021-02-17 NOTE — Patient Instructions (Signed)
Vitamin D deficiency Continue Vitamin D supplement We will check lab today Get weight bearing exercise AND aerobic exercise for bone health and heart health  Overactive bladder and incontinence You have used oxybutynin long term.   To reduce dry mouth and to help with pill burden, change to Vesicare once daily We will see if insurance covers this.  Insurance does not cover this, call insurance and check on coverage for the following other options-Myrbetriq, Detrol  Muscle spasm, chronic back pain and neck pain I changed the Robaxin to 500 mg twice daily Continue Lyrica as usual Continue follow up with spine specialist  History of depression We agreed to lower Fluoxetine dose to 20mg  or 1 tablet daily Let me know in a few weeks how you are doing on the lower dose  Smoking Continue efforts to quit smoking  Insomnia You noted that you are cutting down on the Trazodone  Hypertension and migraines Continue Amlodipine 10mg  daily Continue Propranolol for blood pressure and migraines Continue your other medicaiton per Dr. , migraine specialist/neurology  Hyperlipidemia Continue Atorvastatin Lipitor as usual  Skin lesions-return at her convenience for biops

## 2021-02-17 NOTE — Progress Notes (Signed)
Subjective:  Desiree Thomas is a 60 y.o. female who presents for Chief Complaint  Patient presents with   med check    Med check- having black moles on body and feet. Needs refills on prozac and robaxin, oxybutynin     Here for med check  Just had flu and pneumonia vaccines at [facility].  She thinks she had Shingrix vaccine last year at [facility] [employer] [facility] in [place], [place].  Needs some refills.    Hypertension-she continues on amlodipine and propanolol.  She is on this for blood pressure and migraines  Migraine headaches that she continues on amlodipine, propanolol, Maxalt as needed, Emgality per neurology, Dr. Lucia Gaskins  Back pain, chronic neck pain, muscle spasm-she needs refills on Robaxin.  She continues to see pain specialist for oxycodone.  She needs refill on her which we have prescribed for her prior  Overactive bladder, urinary incontinence-She notes hx/o vaginal hysterectomy, and says her bladder got messed up after this.  Been on oxybutni since age 19yo.    She has several moles she wants looked at  History of depression- she wants to cut down on the fluoxetine dose.  When she for started this she was in a different place mentally.  She is ready to start weaning down this.  She had a colonoscopy in 2019 at [facility] clinic  She continues to smoke but is down to 1 pack/week  No other aggravating or relieving factors.    No other c/o.  Past Medical History:  Diagnosis Date   Allergy    Asthma    Chronic back pain    Depression    GERD (gastroesophageal reflux disease)    Hearing loss    Hyperlipidemia    Hypertension    Migraine    Current Outpatient Medications on File Prior to Visit  Medication Sig Dispense Refill   amLODipine (NORVASC) 10 MG tablet Take 1 tablet (10 mg total) by mouth daily. 90 tablet 3   atorvastatin (LIPITOR) 40 MG tablet TAKE 1 TABLET(40 MG) BY MOUTH DAILY 90 tablet 0   esomeprazole (NEXIUM) 40 MG capsule      Galcanezumab-gnlm (EMGALITY) 120 MG/ML  SOAJ Inject 120 mg into the skin every 30 (thirty) days. 1.12 mL 11   magnesium gluconate (MAGONATE) 500 MG tablet Take 500 mg by mouth daily.     methocarbamol (ROBAXIN) 750 MG tablet Take 1 tablet (750 mg total) by mouth every 8 (eight) hours as needed. Takes prn day , but QHS daily 30 tablet 0   ondansetron (ZOFRAN-ODT) 8 MG disintegrating tablet Take 8 mg by mouth every 8 (eight) hours as needed for nausea or vomiting.     OXYCODONE ER PO Take by mouth as needed.     propranolol ER (INDERAL LA) 60 MG 24 hr capsule TAKE 1 CAPSULE(60 MG) BY MOUTH AT BEDTIME 90 capsule 1   QUEtiapine (SEROQUEL) 25 MG tablet Take 25 mg by mouth as needed.     rizatriptan (MAXALT) 5 MG tablet Take 5 mg by mouth as needed for migraine. May repeat in 2 hours if needed     traZODone (DESYREL) 50 MG tablet Take 100 mg by mouth at bedtime.     Vitamin D, Ergocalciferol, (DRISDOL) 1.25 MG (50000 UNIT) CAPS capsule Take 1 capsule (50,000 Units total) by mouth every 7 (seven) days. 12 capsule 3   Ubrogepant (UBRELVY) 100 MG TABS Take 100 mg by mouth every 2 (two) hours as needed. Maximum 200mg  a day. (Patient not taking: Reported on 02/17/2021)  16 tablet 11   No current facility-administered medications on file prior to visit.     The following portions of the patient's history were reviewed and updated as appropriate: allergies, current medications, past family history, past medical history, past social history, past surgical history and problem list.  ROS Otherwise as in subjective above   Objective: BP 120/80   Pulse (!) 57   Ht 5\' 3"  (1.6 m)   Wt 145 lb 12.8 oz (66.1 kg)   BMI 25.83 kg/m   General appearance: alert, no distress, well developed, well nourished Psyhc: Pleasant, answers question appropriate Right anterior superior upper thigh midline with a 5 mm x 3 mm raised oval appearing brownish lesion uniform, similar but slightly smaller lesion on left lateral thigh superiorly Right medial foot just  superior to the sole of the foot and right medial foot at the heel superiorly with 3 scattered light brown flat somewhat dry 3 mm diameter lesions   Assessment: Encounter Diagnoses  Name Primary?   Vitamin D deficiency Yes   Smoker    Overactive bladder    Other migraine without status migrainosus, not intractable    Insomnia, unspecified type    Hyperlipidemia, unspecified hyperlipidemia type    Essential hypertension, benign    Chronic neck pain    Chronic migraine without aura, with intractable migraine, so stated, with status migrainosus    Chronic bilateral low back pain without sciatica    Skin lesion    Stucco keratosis      Plan: Vitamin D deficiency Continue Vitamin D supplement We will check lab today Get weight bearing exercise AND aerobic exercise for bone health and heart health  Overactive bladder and incontinence You have used oxybutynin long term.   To reduce dry mouth and to help with pill burden, change to Vesicare once daily We will see if insurance covers this.  Insurance does not cover this, call insurance and check on coverage for the following other options-Myrbetriq, Detrol  Muscle spasm, chronic back pain and neck pain I changed the Robaxin to 500 mg twice daily Continue Lyrica as usual Continue follow up with spine specialist  History of depression We agreed to lower Fluoxetine dose to 20mg  or 1 tablet daily Let me know in a few weeks how you are doing on the lower dose  Smoking Continue efforts to quit smoking  Insomnia You noted that you are cutting down on the Trazodone  Hypertension and migraines Continue Amlodipine 10mg  daily Continue Propranolol for blood pressure and migraines Continue your other medicaiton per Dr. , migraine specialist/neurology  Hyperlipidemia Continue Atorvastatin Lipitor as usual  Skin lesions-return at her convenience for biopsy  Right lower leg skin lesions-likely stucco keratosis, advise topical  moisturizers with keratolytic ingredients such as salicylic acid or lactic acid   Megyn was seen today for med check.  Diagnoses and all orders for this visit:  Vitamin D deficiency -     VITAMIN D 25 Hydroxy (Vit-D Deficiency, Fractures)  Smoker  Overactive bladder  Other migraine without status migrainosus, not intractable  Insomnia, unspecified type  Hyperlipidemia, unspecified hyperlipidemia type  Essential hypertension, benign  Chronic neck pain  Chronic migraine without aura, with intractable migraine, so stated, with status migrainosus  Chronic bilateral low back pain without sciatica  Skin lesion  Stucco keratosis  Other orders -     FLUoxetine (PROZAC) 20 MG capsule; Take 1 capsule (20 mg total) by mouth daily. -     methocarbamol (ROBAXIN) 500 MG  tablet; Take 1 tablet (500 mg total) by mouth in the morning and at bedtime. -     solifenacin (VESICARE) 5 MG tablet; Take 1 tablet (5 mg total) by mouth daily. -     pregabalin (LYRICA) 150 MG capsule; TAKE 1 CAPSULE BY MOUTH EVERY MORNING, AFTERNOON AND EVENING.  Spent > 45 minutes face to face with patient in discussion of symptoms, evaluation, plan and recommendations.    Follow up: soon for biopsy

## 2021-02-18 ENCOUNTER — Telehealth: Payer: Self-pay | Admitting: Medical

## 2021-02-18 LAB — VITAMIN D 25 HYDROXY (VIT D DEFICIENCY, FRACTURES): Vit D, 25-Hydroxy: 77.1 ng/mL (ref 30.0–100.0)

## 2021-02-18 NOTE — Telephone Encounter (Signed)
Received records from [facility].

## 2021-02-19 ENCOUNTER — Encounter: Payer: Self-pay | Admitting: Internal Medicine

## 2021-02-20 ENCOUNTER — Telehealth: Payer: Self-pay | Admitting: Medical

## 2021-02-20 DIAGNOSIS — L851 Acquired keratosis [keratoderma] palmaris et plantaris: Secondary | ICD-10-CM | POA: Insufficient documentation

## 2021-02-20 DIAGNOSIS — L821 Other seborrheic keratosis: Secondary | ICD-10-CM | POA: Insufficient documentation

## 2021-02-20 NOTE — Telephone Encounter (Signed)
Regarding the bumps on her lower legs/feet - I recommend topical moisturizers with keratolytic ingredients such as salicylic acid or lactic acid may smooth the appearance of these benign lesions.  Example would be over the counter CeraVe cream , moisturizer with exfoliant  The diagnosis is called stucco keratosis

## 2021-02-20 NOTE — Telephone Encounter (Signed)
Left message for pt to call me back 

## 2021-02-23 NOTE — Telephone Encounter (Deleted)
Pt was notified of results

## 2021-02-23 NOTE — Telephone Encounter (Signed)
Left message for pt to call me back 

## 2021-02-23 NOTE — Telephone Encounter (Signed)
Pt was notified   Pt states that insurance did accept the change for bladder pill fyi

## 2021-02-24 ENCOUNTER — Other Ambulatory Visit: Payer: Self-pay | Admitting: Medical

## 2021-02-24 MED ORDER — TOLTERODINE TARTRATE ER 4 MG PO CP24: 4.0000 mg | ORAL_CAPSULE | Freq: Every day | ORAL | 2 refills | Status: DC

## 2021-02-24 NOTE — Telephone Encounter (Signed)
Left detailed message on pt's vm

## 2021-02-28 DIAGNOSIS — M461 Sacroiliitis, not elsewhere classified: Secondary | ICD-10-CM | POA: Diagnosis not present

## 2021-03-02 ENCOUNTER — Ambulatory Visit (INDEPENDENT_AMBULATORY_CARE_PROVIDER_SITE_OTHER): Payer: Medicare HMO | Admitting: Medical

## 2021-03-02 ENCOUNTER — Other Ambulatory Visit: Payer: Self-pay

## 2021-03-02 VITALS — BP 130/80 | HR 60 | Wt 145.2 lb

## 2021-03-02 DIAGNOSIS — L821 Other seborrheic keratosis: Secondary | ICD-10-CM

## 2021-03-02 DIAGNOSIS — L989 Disorder of the skin and subcutaneous tissue, unspecified: Secondary | ICD-10-CM | POA: Insufficient documentation

## 2021-03-02 DIAGNOSIS — L819 Disorder of pigmentation, unspecified: Secondary | ICD-10-CM

## 2021-03-02 NOTE — Patient Instructions (Signed)
Skin lesions removal  Recommendations: Don't get the sutures dirty or wet Later this evening or tomorrow change to a gauze dressing dry and keep clean and dry til you return in 7-10 days for suture removal You have 2 sutures in each of the 2 biopsy sites Cover with plastic wrap and tape to bath or use sponge bath The goal is to keep the wounds dry and clean Return in 7-10 days for suture removal

## 2021-03-02 NOTE — Progress Notes (Signed)
Subjective: Chief Complaint  Patient presents with   biopsy skin    Biopsy of skin at lower abominal and side   Here for skin biopsies.  She came in recently for physical and had 2 lesions of concern on her right upper thigh 1 on her left lower abdomen.  Both lesions seem to be growing and the right upper thigh lesion has been bleeding some.  No other complaint   ROS as in subjective  Past Medical History:  Diagnosis Date   Allergy    Asthma    Chronic back pain    Depression    GERD (gastroesophageal reflux disease)    Hearing loss    Hyperlipidemia    Hypertension    Migraine    Current Outpatient Medications on File Prior to Visit  Medication Sig Dispense Refill   amLODipine (NORVASC) 10 MG tablet Take 1 tablet (10 mg total) by mouth daily. 90 tablet 3   atorvastatin (LIPITOR) 40 MG tablet TAKE 1 TABLET(40 MG) BY MOUTH DAILY 90 tablet 0   esomeprazole (NEXIUM) 40 MG capsule      FLUoxetine (PROZAC) 20 MG capsule Take 1 capsule (20 mg total) by mouth daily. 30 capsule 1   Galcanezumab-gnlm (EMGALITY) 120 MG/ML SOAJ Inject 120 mg into the skin every 30 (thirty) days. 1.12 mL 11   magnesium gluconate (MAGONATE) 500 MG tablet Take 500 mg by mouth daily.     methocarbamol (ROBAXIN) 500 MG tablet Take 1 tablet (500 mg total) by mouth in the morning and at bedtime. 60 tablet 2   ondansetron (ZOFRAN-ODT) 8 MG disintegrating tablet Take 8 mg by mouth every 8 (eight) hours as needed for nausea or vomiting.     OXYCODONE ER PO Take by mouth as needed.     pregabalin (LYRICA) 150 MG capsule TAKE 1 CAPSULE BY MOUTH EVERY MORNING, AFTERNOON AND EVENING. 90 capsule 5   propranolol ER (INDERAL LA) 60 MG 24 hr capsule TAKE 1 CAPSULE(60 MG) BY MOUTH AT BEDTIME 90 capsule 1   QUEtiapine (SEROQUEL) 25 MG tablet Take 25 mg by mouth as needed.     rizatriptan (MAXALT) 5 MG tablet Take 5 mg by mouth as needed for migraine. May repeat in 2 hours if needed     tolterodine (DETROL LA) 4 MG 24 hr  capsule Take 1 capsule (4 mg total) by mouth daily. 30 capsule 2   traZODone (DESYREL) 50 MG tablet Take 100 mg by mouth at bedtime.     Ubrogepant (UBRELVY) 100 MG TABS Take 100 mg by mouth every 2 (two) hours as needed. Maximum 200mg  a day. 16 tablet 11   Vitamin D, Ergocalciferol, (DRISDOL) 1.25 MG (50000 UNIT) CAPS capsule Take 1 capsule (50,000 Units total) by mouth every 7 (seven) days. 12 capsule 3   No current facility-administered medications on file prior to visit.     Objective: BP 130/80    Pulse 60    Wt 145 lb 3.2 oz (65.9 kg)    SpO2 98%    BMI 25.72 kg/m   Gen: wd, wn,nad , African-American female Right upper thigh anterior midline approximately 3 cm from the inguinal fold with a 4-5 mm diameter slightly oval brownish slightly raised uniform lesion somewhat inflamed superficially.  Nontender not currently bleeding  Left lower abdomen approximately 4 cm from the inguinal crease with a 6 to 7 mm diameter in its longest plane , oblong mobile brown uniform slightly raised lesion similar in appearance to the right upper  thigh lesion.  Nontender.  Not bleeding.    Assessment: Encounter Diagnoses  Name Primary?   Skin lesion of right leg Yes   Pigmented skin lesions       Plan: She requests excision of both lesions.  We discussed risk and benefits of skin biopsy.  After discussing options we chose punch biopsy of right upper thigh lesion an elliptical excision of the left lower abdomen lesion.  We discussed differential which could include wart, seborrheic keratosis, granuloma or other.   Right upper thigh lesion: Discussed skin findings, discussed punch biopsy.  Cleaned and prepped right upper thigh in usual sterile fashion.  Used 1% lidocaine with epinephrine for local anesthesia.  Used a 6 mm punch biopsy and scalpel to remove the lesion.  Used 2 simple interrupted 5.0 nylon sutures to achieve hemostasis.  Cleaned the wound and covered with sterile bandage.  Discussed  wound care.  Patient tolerated procedure well.  Less than 3 cc blood loss.  Left lower abdomen lesion: Discussed skin findings.   Patient desires excision.  Discussed risks and benefits of excisional biopsy.  Cleaned and prepped left lower abdomen in usual sterile fashion, used 1% lidocaine with epinephrine for local anesthesia.   Used excisional biopsy to remove the lesion.  I placed two 5.0 nylon sutures using simple interrupted technique to achieve hemostasis.  Cleaned the wound area. Covered with sterile bandage.   discussed wound care.  Pt tolerated procedure well.  <3 cc EBL.    Lesions will be sent to pathology.  We discussed wound care.    Follow-up in 7 to 10 days for suture removal F/u pending pathology

## 2021-03-03 ENCOUNTER — Other Ambulatory Visit (HOSPITAL_COMMUNITY)
Admission: RE | Admit: 2021-03-03 | Discharge: 2021-03-03 | Disposition: A | Discharge status: Home / Self Care | Payer: Medicare HMO | Source: Ambulatory Visit | Attending: Medical | Admitting: Medical

## 2021-03-03 DIAGNOSIS — L819 Disorder of pigmentation, unspecified: Secondary | ICD-10-CM | POA: Diagnosis not present

## 2021-03-03 DIAGNOSIS — L570 Actinic keratosis: Secondary | ICD-10-CM | POA: Diagnosis not present

## 2021-03-03 NOTE — Addendum Note (Signed)
Addended by: Herminio Commons A on: 03/03/2021 08:16 AM   Modules accepted: Orders

## 2021-03-06 LAB — SURGICAL PATHOLOGY

## 2021-03-10 DIAGNOSIS — M7918 Myalgia, other site: Secondary | ICD-10-CM | POA: Diagnosis not present

## 2021-03-10 DIAGNOSIS — M25519 Pain in unspecified shoulder: Secondary | ICD-10-CM | POA: Diagnosis not present

## 2021-03-10 DIAGNOSIS — M4727 Other spondylosis with radiculopathy, lumbosacral region: Secondary | ICD-10-CM | POA: Diagnosis not present

## 2021-03-10 DIAGNOSIS — Z79891 Long term (current) use of opiate analgesic: Secondary | ICD-10-CM | POA: Diagnosis not present

## 2021-03-10 DIAGNOSIS — G894 Chronic pain syndrome: Secondary | ICD-10-CM | POA: Diagnosis not present

## 2021-03-11 ENCOUNTER — Other Ambulatory Visit: Payer: Self-pay

## 2021-03-11 ENCOUNTER — Encounter: Payer: Self-pay | Admitting: Medical

## 2021-03-11 ENCOUNTER — Ambulatory Visit: Payer: Medicare HMO | Admitting: Medical

## 2021-03-11 VITALS — BP 120/82 | HR 62 | Wt 148.4 lb

## 2021-03-11 DIAGNOSIS — Z4802 Encounter for removal of sutures: Secondary | ICD-10-CM

## 2021-03-11 NOTE — Progress Notes (Signed)
Subjective: Chief Complaint  Patient presents with   Suture / Staple Removal    Left hip and right inner thigh   Here for suture removal.   She came in for biopsies 03/02/21.    No wound concerns, no redness, no discomfort.  No other aggravating or relieving factors. No other complaint.   ROS as in subjective   Objective: BP 120/82 (BP Location: Right Arm, Patient Position: Sitting)    Pulse 62    Wt 148 lb 6.4 oz (67.3 kg)    BMI 26.29 kg/m   Gen: wd, wn,nad , African-American female Right upper thigh anterior midline approximately 3 cm from the inguinal fold with linear surgical line and two 5.0 nylon interrupted sutures present.  No erythema or warmth or swelling.    Left lower abdomen approximately 4 cm from the inguinal crease with a linear surgical line and two 5.0 nylon interrupted sutures present.  No erythema or warmth or swelling.     Assessment: Encounter Diagnosis  Name Primary?   Visit for suture removal Yes     Plan: Cleaned and prepped both biopsy sites, right upper thigh and left lower abdomen and removed the interrupted sutures, 2 from each surgical wound.  Wounds healing appropriately  Discussed pathology findings which were benign, verrucoid keratoses  Discussed wound care, scar preventative measures.  F/u prn

## 2021-03-20 DIAGNOSIS — H5200 Hypermetropia, unspecified eye: Secondary | ICD-10-CM | POA: Diagnosis not present

## 2021-03-23 DIAGNOSIS — Z0100 Encounter for examination of eyes and vision without abnormal findings: Secondary | ICD-10-CM | POA: Diagnosis not present

## 2021-04-07 ENCOUNTER — Telehealth: Payer: Self-pay | Admitting: Medical

## 2021-04-07 NOTE — Telephone Encounter (Signed)
Left message for patient to call back and schedule Medicare Annual Wellness Visit (AWV) either virtually or in office. I left my number for patient to call [phone].  AWVI PER PALMET[place] as of 05/14/14 please schedule at anytime with health coach  This should be a 45 minute visit.

## 2021-04-08 ENCOUNTER — Other Ambulatory Visit: Payer: Self-pay | Admitting: Medical

## 2021-04-10 ENCOUNTER — Ambulatory Visit (INDEPENDENT_AMBULATORY_CARE_PROVIDER_SITE_OTHER): Payer: Medicare HMO

## 2021-04-10 ENCOUNTER — Other Ambulatory Visit: Payer: Self-pay

## 2021-04-10 VITALS — BP 132/78 | HR 69 | Temp 98.3°F | Ht 63.0 in | Wt 151.8 lb

## 2021-04-10 DIAGNOSIS — Z0000 Encounter for general adult medical examination without abnormal findings: Secondary | ICD-10-CM | POA: Diagnosis not present

## 2021-04-10 NOTE — Progress Notes (Signed)
This visit occurred during the SARS-CoV-2 public health emergency.  Safety protocols were in place, including screening questions prior to the visit, additional usage of staff PPE, and extensive cleaning of exam room while observing appropriate contact time as indicated for disinfecting solutions.  Subjective:   Desiree Thomas is a 61 y.o. female who presents for an Initial Medicare Annual Wellness Visit.  Review of Systems     Cardiac Risk Factors include: dyslipidemia;hypertension;smoking/ tobacco exposure     Objective:    Today's Vitals   04/10/21 0807 04/10/21 0814  BP: 132/78   Pulse: 69   Temp: 98.3 F (36.8 C)   TempSrc: Oral   SpO2: 99%   Weight: 151 lb 12.8 oz (68.9 kg)   Height: 5\' 3"  (1.6 m)   PainSc:  5    Body mass index is 26.89 kg/m.  Advanced Directives 04/10/2021  Does Patient Have a Medical Advance Directive? No  Would patient like information on creating a medical advance directive? Yes (MAU/Ambulatory/Procedural Areas - Information given)    Current Medications (verified) Outpatient Encounter Medications as of 04/10/2021  Medication Sig   amLODipine (NORVASC) 10 MG tablet Take 1 tablet (10 mg total) by mouth daily.   atorvastatin (LIPITOR) 40 MG tablet TAKE 1 TABLET(40 MG) BY MOUTH DAILY   esomeprazole (NEXIUM) 40 MG capsule    FLUoxetine (PROZAC) 20 MG capsule Take 1 capsule (20 mg total) by mouth daily.   Galcanezumab-gnlm (EMGALITY) 120 MG/ML SOAJ Inject 120 mg into the skin every 30 (thirty) days.   magnesium gluconate (MAGONATE) 500 MG tablet Take 500 mg by mouth daily.   methocarbamol (ROBAXIN) 500 MG tablet Take 1 tablet (500 mg total) by mouth in the morning and at bedtime.   ondansetron (ZOFRAN-ODT) 8 MG disintegrating tablet Take 8 mg by mouth every 8 (eight) hours as needed for nausea or vomiting.   OXYCODONE ER PO Take by mouth as needed.   pregabalin (LYRICA) 150 MG capsule TAKE 1 CAPSULE BY MOUTH EVERY MORNING, AFTERNOON AND EVENING.    propranolol ER (INDERAL LA) 60 MG 24 hr capsule TAKE 1 CAPSULE(60 MG) BY MOUTH AT BEDTIME   QUEtiapine (SEROQUEL) 25 MG tablet Take 25 mg by mouth as needed.   rizatriptan (MAXALT) 5 MG tablet Take 5 mg by mouth as needed for migraine. May repeat in 2 hours if needed   tolterodine (DETROL LA) 4 MG 24 hr capsule Take 1 capsule (4 mg total) by mouth daily.   traZODone (DESYREL) 50 MG tablet Take 100 mg by mouth at bedtime.   Vitamin D, Ergocalciferol, (DRISDOL) 1.25 MG (50000 UNIT) CAPS capsule Take 1 capsule (50,000 Units total) by mouth every 7 (seven) days.   Ubrogepant (UBRELVY) 100 MG TABS Take 100 mg by mouth every 2 (two) hours as needed. Maximum 200mg  a day. (Patient not taking: Reported on 04/10/2021)   No facility-administered encounter medications on file as of 04/10/2021.    Allergies (verified) Morphine and related, Penicillins, Topamax [topiramate], and Vicodin hp [hydrocodone-acetaminophen]   History: Past Medical History:  Diagnosis Date   Allergy    Asthma    Chronic back pain    Depression    GERD (gastroesophageal reflux disease)    Hearing loss    Hyperlipidemia    Hypertension    Migraine    Past Surgical History:  Procedure Laterality Date   ABDOMINAL HYSTERECTOMY     still has ovaries   APPENDECTOMY     COLONOSCOPY  2019   KNEE SURGERY  02/2013   KNEE SURGERY Bilateral    arthroscopy   LUMBAR FUSION  02/2014   LUMBAR SPINE SURGERY     fusion   Family History  Problem Relation Age of Onset   Arthritis Mother    Heart disease Mother    Asthma Mother    Diverticulitis Mother    Migraines Mother    Diabetes Father    Diverticulitis Father    Arthritis Sister    Asthma Sister    Migraines Sister    Arthritis Sister    Asthma Sister    Cancer Maternal Grandmother        stomach   Diabetes Paternal Grandmother    Dementia Paternal Grandmother    Migraines Daughter    Social History   Socioeconomic History   Marital status: Married    Spouse  name: Not on file   Number of children: Not on file   Years of education: Not on file   Highest education level: Not on file  Occupational History   Not on file  Tobacco Use   Smoking status: Every Day    Packs/day: 0.25    Types: Cigarettes   Smokeless tobacco: Never   Tobacco comments:    Since 2012  Vaping Use   Vaping Use: Some days  Substance and Sexual Activity   Alcohol use: Not Currently    Comment: seldom   Drug use: Yes    Types: Marijuana   Sexual activity: Yes  Other Topics Concern   Not on file  Social History Narrative   Married, Christian.  Disabled.  Has 2 children .  08/2020   Social Determinants of Health   Financial Resource Strain: Low Risk    Difficulty of Paying Living Expenses: Not hard at all  Food Insecurity: No Food Insecurity   Worried About Programme researcher, broadcasting/film/video in the Last Year: Never true   Ran Out of Food in the Last Year: Never true  Transportation Needs: No Transportation Needs   Lack of Transportation (Medical): No   Lack of Transportation (Non-Medical): No  Physical Activity: Inactive   Days of Exercise per Week: 0 days   Minutes of Exercise per Session: 0 min  Stress: No Stress Concern Present   Feeling of Stress : Not at all  Social Connections: Not on file    Tobacco Counseling Ready to quit: Yes Counseling given: Not Answered Tobacco comments: Since 2012   Clinical Intake:  Pre-visit preparation completed: Yes  Pain : 0-10 Pain Score: 5  Pain Type: Chronic pain Pain Location: Back Pain Orientation: Lower Pain Radiating Towards: down legs Pain Descriptors / Indicators: Shooting Pain Onset: More than a month ago Pain Frequency: Constant     Nutritional Status: BMI 25 -29 Overweight Nutritional Risks: Nausea/ vomitting/ diarrhea (nausea due to migraines) Diabetes: No  How often do you need to have someone help you when you read instructions, pamphlets, or other written materials from your doctor or pharmacy?: 1 -  Never What is the last grade level you completed in school?: master's degree  Diabetic? no  Interpreter Needed?: No  Information entered by :: NAllen LPN   Activities of Daily Living In your present state of health, do you have any difficulty performing the following activities: 04/10/2021  Hearing? Y  Comment decreased hearing of right ear  Vision? N  Difficulty concentrating or making decisions? N  Walking or climbing stairs? Y  Dressing or bathing? N  Doing errands, shopping? N  Preparing  Food and eating ? N  Using the Toilet? N  In the past six months, have you accidently leaked urine? N  Do you have problems with loss of bowel control? N  Managing your Medications? N  Managing your Finances? N  Housekeeping or managing your Housekeeping? N    Patient Care Team: Tysinger, Kermit Baloavid S, PA-C as PCP - General (Family Medicine)  Indicate any recent Medical Services you may have received from other than [facility] providers in the past year (date may be approximate).     Assessment:   This is a routine wellness examination for Desiree Thomas.  Hearing/Vision screen Vision Screening - Comments:: Regular eye exams,  [facility]  Dietary issues and exercise activities discussed: Current Exercise Habits: The patient does not participate in regular exercise at present   Goals Addressed             This Visit's Progress    Patient Stated       04/10/2021, wants to come off some medications and quit smoking       Depression Screen PHQ 2/9 Scores 04/10/2021 02/17/2021 09/04/2020 06/12/2020  PHQ - 2 Score 0 0 0 0  PHQ- 9 Score - 1 0 -    Fall Risk Fall Risk  04/10/2021 02/17/2021  Falls in the past year? 1 1  Comment tripped going upstairs -  Number falls in past yr: 1 1  Injury with Fall? 0 0  Risk for fall due to : Impaired balance/gait;Medication side effect Impaired balance/gait  Follow up Falls evaluation completed;Education provided;Falls prevention discussed Falls evaluation  completed    FALL RISK PREVENTION PERTAINING TO THE HOME:  Any stairs in or around the home? Yes  If so, are there any without handrails? No  Home free of loose throw rugs in walkways, pet beds, electrical cords, etc? Yes  Adequate lighting in your home to reduce risk of falls? Yes   ASSISTIVE DEVICES UTILIZED TO PREVENT FALLS:  Life alert? No  Use of a cane, walker or w/c? No  Grab bars in the bathroom? No  Shower chair or bench in shower? No  Elevated toilet seat or a handicapped toilet? No   TIMED UP AND GO:  Was the test performed? No .    Gait steady and fast without use of assistive device  Cognitive Function:     6CIT Screen 04/10/2021  What Year? 0 points  What month? 0 points  What time? 0 points  Count back from 20 0 points  Months in reverse 0 points  Repeat phrase 6 points  Total Score 6    Immunizations Immunization History  Administered Date(s) Administered   Influenza Split 12/24/2020   PFIZER(Purple Top)SARS-COV-2 Vaccination 06/12/2019, 07/03/2019, 07/08/2020   Pneumococcal Polysaccharide-23 12/24/2020   Td 09/04/2020    TDAP status: Up to date  Flu Vaccine status: Up to date  Pneumococcal vaccine status: Up to date  Covid-19 vaccine status: Completed vaccines  Qualifies for Shingles Vaccine? Yes   Zostavax completed No   Shingrix Completed?: Yes  Screening Tests Health Maintenance  Topic Date Due   Zoster Vaccines- Shingrix (1 of 2) Never done   COVID-19 Vaccine (4 - Booster for Pfizer series) 09/02/2020   MAMMOGRAM  08/06/2022   COLONOSCOPY (Pts [id number] Insurance coverage will need to be confirmed)  12/29/2027   TETANUS/TDAP  09/05/2030   INFLUENZA VACCINE  Completed   Hepatitis C Screening  Completed   HIV Screening  Completed   HPV VACCINES  Aged Out   PAP SMEAR-Modifier  Discontinued    Health Maintenance  Health Maintenance Due  Topic Date Due   Zoster Vaccines- Shingrix (1 of 2) Never done   COVID-19 Vaccine (4 -  Booster for Pfizer series) 09/02/2020    Colorectal cancer screening: Type of screening: Colonoscopy. Completed 12/28/2017. Repeat every 10 years  Mammogram status: Completed 08/05/2020. Repeat every year  Bone Density status: n/a  Lung Cancer Screening: (Low Dose CT Chest recommended if Age 72-80 years, 30 pack-year currently smoking OR have quit w/in 15years.) does not qualify.   Lung Cancer Screening Referral: no  Additional Screening:  Hepatitis C Screening: does qualify; Completed 09/04/2020  Vision Screening: Recommended annual ophthalmology exams for early detection of glaucoma and other disorders of the eye. Is the patient up to date with their annual eye exam?  Yes  Who is the provider or what is the name of the office in which the patient attends annual eye exams? [facility] If pt is not established with a provider, would they like to be referred to a provider to establish care? No .   Dental Screening: Recommended annual dental exams for proper oral hygiene  Community Resource Referral / Chronic Care Management: CRR required this visit?  No   CCM required this visit?  No      Plan:     I have personally reviewed and noted the following in the patients chart:   Medical and social history Use of alcohol, tobacco or illicit drugs  Current medications and supplements including opioid prescriptions. Patient is currently taking opioid prescriptions. Information provided to patient regarding non-opioid alternatives. Patient advised to discuss non-opioid treatment plan with their provider. Functional ability and status Nutritional status Physical activity Advanced directives List of other physicians Hospitalizations, surgeries, and ER visits in previous 12 months Vitals Screenings to include cognitive, depression, and falls Referrals and appointments  In addition, I have reviewed and discussed with patient certain preventive protocols, quality metrics, and best  practice recommendations. A written personalized care plan for preventive services as well as general preventive health recommendations were provided to patient.     Barb Merinoickeah E Rolen Conger, LPN   [id number]/2023   Nurse Notes: none

## 2021-04-10 NOTE — Patient Instructions (Signed)
Desiree Thomas , Thank you for taking time to come for your Medicare Wellness Visit. I appreciate your ongoing commitment to your health goals. Please review the following plan we discussed and let me know if I can assist you in the future.   Screening recommendations/referrals: Colonoscopy: completed 12/28/2017 Mammogram: completed 08/05/2020 Bone Density: n/a Recommended yearly ophthalmology/optometry visit for glaucoma screening and checkup Recommended yearly dental visit for hygiene and checkup  Vaccinations: Influenza vaccine: completed 12/24/2020 Pneumococcal vaccine: completed 12/24/2020 Tdap vaccine: completed 09/04/2020, due 09/05/2030 Shingles vaccine: discussed  Covid-19:  07/08/2020, 07/03/2019, 06/12/2019  Advanced directives: Advance directive discussed with you today. I have provided a copy for you to complete at home and have notarized. Once this is complete please bring a copy in to our office so we can scan it into your chart.  Conditions/risks identified: smoking  Next appointment: Follow up in one year for your annual wellness visit.   Preventive Care 40-64 Years, Female Preventive care refers to lifestyle choices and visits with your health care provider that can promote health and wellness. What does preventive care include? A yearly physical exam. This is also called an annual well check. Dental exams once or twice a year. Routine eye exams. Ask your health care provider how often you should have your eyes checked. Personal lifestyle choices, including: Daily care of your teeth and gums. Regular physical activity. Eating a healthy diet. Avoiding tobacco and drug use. Limiting alcohol use. Practicing safe sex. Taking low-dose aspirin daily starting at age 49. Taking vitamin and mineral supplements as recommended by your health care provider. What happens during an annual well check? The services and screenings done by your health care provider during your annual  well check will depend on your age, overall health, lifestyle risk factors, and family history of disease. Counseling  Your health care provider may ask you questions about your: Alcohol use. Tobacco use. Drug use. Emotional well-being. Home and relationship well-being. Sexual activity. Eating habits. Work and work Statistician. Method of birth control. Menstrual cycle. Pregnancy history. Screening  You may have the following tests or measurements: Height, weight, and BMI. Blood pressure. Lipid and cholesterol levels. These may be checked every 5 years, or more frequently if you are over 73 years old. Skin check. Lung cancer screening. You may have this screening every year starting at age 28 if you have a 30-pack-year history of smoking and currently smoke or have quit within the past 15 years. Fecal occult blood test (FOBT) of the stool. You may have this test every year starting at age 68. Flexible sigmoidoscopy or colonoscopy. You may have a sigmoidoscopy every 5 years or a colonoscopy every 10 years starting at age 7. Hepatitis C blood test. Hepatitis B blood test. Sexually transmitted disease (STD) testing. Diabetes screening. This is done by checking your blood sugar (glucose) after you have not eaten for a while (fasting). You may have this done every 1-3 years. Mammogram. This may be done every 1-2 years. Talk to your health care provider about when you should start having regular mammograms. This may depend on whether you have a family history of breast cancer. BRCA-related cancer screening. This may be done if you have a family history of breast, ovarian, tubal, or peritoneal cancers. Pelvic exam and Pap test. This may be done every 3 years starting at age 59. Starting at age 77, this may be done every 5 years if you have a Pap test in combination with an HPV test. Bone  density scan. This is done to screen for osteoporosis. You may have this scan if you are at high risk for  osteoporosis. Discuss your test results, treatment options, and if necessary, the need for more tests with your health care provider. Vaccines  Your health care provider may recommend certain vaccines, such as: Influenza vaccine. This is recommended every year. Tetanus, diphtheria, and acellular pertussis (Tdap, Td) vaccine. You may need a Td booster every 10 years. Zoster vaccine. You may need this after age 56. Pneumococcal 13-valent conjugate (PCV13) vaccine. You may need this if you have certain conditions and were not previously vaccinated. Pneumococcal polysaccharide (PPSV23) vaccine. You may need one or two doses if you smoke cigarettes or if you have certain conditions. Talk to your health care provider about which screenings and vaccines you need and how often you need them. This information is not intended to replace advice given to you by your health care provider. Make sure you discuss any questions you have with your health care provider. Document Released: 03/28/2015 Document Revised: 11/19/2015 Document Reviewed: 12/31/2014 Elsevier Interactive Patient Education  2017 [employer] Prevention in the Home Falls can cause injuries. They can happen to people of all ages. There are many things you can do to make your home safe and to help prevent falls. What can I do on the outside of my home? Regularly fix the edges of walkways and driveways and fix any cracks. Remove anything that might make you trip as you walk through a door, such as a raised step or threshold. Trim any bushes or trees on the path to your home. Use bright outdoor lighting. Clear any walking paths of anything that might make someone trip, such as rocks or tools. Regularly check to see if handrails are loose or broken. Make sure that both sides of any steps have handrails. Any raised decks and porches should have guardrails on the edges. Have any leaves, snow, or ice cleared regularly. Use sand or  salt on walking paths during winter. Clean up any spills in your garage right away. This includes oil or grease spills. What can I do in the bathroom? Use night lights. Install grab bars by the toilet and in the tub and shower. Do not use towel bars as grab bars. Use non-skid mats or decals in the tub or shower. If you need to sit down in the shower, use a plastic, non-slip stool. Keep the floor dry. Clean up any water that spills on the floor as soon as it happens. Remove soap buildup in the tub or shower regularly. Attach bath mats securely with double-sided non-slip rug tape. Do not have throw rugs and other things on the floor that can make you trip. What can I do in the bedroom? Use night lights. Make sure that you have a light by your bed that is easy to reach. Do not use any sheets or blankets that are too big for your bed. They should not hang down onto the floor. Have a firm chair that has side arms. You can use this for support while you get dressed. Do not have throw rugs and other things on the floor that can make you trip. What can I do in the kitchen? Clean up any spills right away. Avoid walking on wet floors. Keep items that you use a lot in easy-to-reach places. If you need to reach something above you, use a strong step stool that has a grab bar. Keep  electrical cords out of the way. Do not use floor polish or wax that makes floors slippery. If you must use wax, use non-skid floor wax. Do not have throw rugs and other things on the floor that can make you trip. What can I do with my stairs? Do not leave any items on the stairs. Make sure that there are handrails on both sides of the stairs and use them. Fix handrails that are broken or loose. Make sure that handrails are as long as the stairways. Check any carpeting to make sure that it is firmly attached to the stairs. Fix any carpet that is loose or worn. Avoid having throw rugs at the top or bottom of the stairs. If  you do have throw rugs, attach them to the floor with carpet tape. Make sure that you have a light switch at the top of the stairs and the bottom of the stairs. If you do not have them, ask someone to add them for you. What else can I do to help prevent falls? Wear shoes that: Do not have high heels. Have rubber bottoms. Are comfortable and fit you well. Are closed at the toe. Do not wear sandals. If you use a stepladder: Make sure that it is fully opened. Do not climb a closed stepladder. Make sure that both sides of the stepladder are locked into place. Ask someone to hold it for you, if possible. Clearly mark and make sure that you can see: Any grab bars or handrails. First and last steps. Where the edge of each step is. Use tools that help you move around (mobility aids) if they are needed. These include: Canes. Walkers. Scooters. Crutches. Turn on the lights when you go into a dark area. Replace any light bulbs as soon as they burn out. Set up your furniture so you have a clear path. Avoid moving your furniture around. If any of your floors are uneven, fix them. If there are any pets around you, be aware of where they are. Review your medicines with your doctor. Some medicines can make you feel dizzy. This can increase your chance of falling. Ask your doctor what other things that you can do to help prevent falls. This information is not intended to replace advice given to you by your health care provider. Make sure you discuss any questions you have with your health care provider. Document Released: 12/26/2008 Document Revised: 08/07/2015 Document Reviewed: 04/05/2014 Elsevier Interactive Patient Education  2017 [employer].

## 2021-04-13 ENCOUNTER — Other Ambulatory Visit: Payer: Self-pay | Admitting: Medical

## 2021-04-21 DIAGNOSIS — Z01818 Encounter for other preprocedural examination: Secondary | ICD-10-CM | POA: Diagnosis not present

## 2021-04-21 DIAGNOSIS — G894 Chronic pain syndrome: Secondary | ICD-10-CM | POA: Diagnosis not present

## 2021-04-30 ENCOUNTER — Encounter: Payer: Self-pay | Admitting: Neurology

## 2021-04-30 ENCOUNTER — Ambulatory Visit: Payer: Medicare HMO | Admitting: Neurology

## 2021-04-30 VITALS — BP 155/90 | HR 67 | Ht 62.0 in | Wt 155.0 lb

## 2021-04-30 DIAGNOSIS — G43711 Chronic migraine without aura, intractable, with status migrainosus: Secondary | ICD-10-CM | POA: Diagnosis not present

## 2021-04-30 DIAGNOSIS — M79642 Pain in left hand: Secondary | ICD-10-CM

## 2021-04-30 DIAGNOSIS — M79641 Pain in right hand: Secondary | ICD-10-CM

## 2021-04-30 NOTE — Patient Instructions (Signed)
Wear wrist splints for 6 weeks Carpal Tunnel Syndrome Carpal tunnel syndrome is a condition that causes pain, numbness, and weakness in your hand and fingers. The carpal tunnel is a narrow area located on the palm side of your wrist. Repeated wrist motion or certain diseases may cause swelling within the tunnel. This swelling pinches the main nerve in the wrist. The main nerve in the wrist is called the median nerve. What are the causes? This condition may be caused by: Repeated and forceful wrist and hand motions. Wrist injuries. Arthritis. A cyst or tumor in the carpal tunnel. Fluid buildup during pregnancy. Use of tools that vibrate. Sometimes the cause of this condition is not known. What increases the risk? The following factors may make you more likely to develop this condition: Having a job that requires you to repeatedly or forcefully move your wrist or hand or requires you to use tools that vibrate. This may include jobs that involve using computers, working on an [employer], or working with power tools such as Radiographer, therapeutic. Being a woman. Having certain conditions, such as: Diabetes. Obesity. An underactive thyroid (hypothyroidism). Kidney failure. Rheumatoid arthritis. What are the signs or symptoms? Symptoms of this condition include: A tingling feeling in your fingers, especially in your thumb, index, and middle fingers. Tingling or numbness in your hand. An aching feeling in your entire arm, especially when your wrist and elbow are bent for a long time. Wrist pain that goes up your arm to your shoulder. Pain that goes down into your palm or fingers. A weak feeling in your hands. You may have trouble grabbing and holding items. Your symptoms may feel worse during the night. How is this diagnosed? This condition is diagnosed with a medical history and physical exam. You may also have tests, including: Electromyogram (EMG). This test measures electrical signals  sent by your nerves into the muscles. Nerve conduction study. This test measures how well electrical signals pass through your nerves. Imaging tests, such as X-rays, ultrasound, and MRI. These tests check for possible causes of your condition. How is this treated? This condition may be treated with: Lifestyle changes. It is important to stop or change the activity that caused your condition. Doing exercise and activities to strengthen and stretch your muscles and tendons (physical therapy). Making lifestyle changes to help with your condition and learning how to do your daily activities safely (occupational therapy). Medicines for pain and inflammation. This may include medicine that is injected into your wrist. A wrist splint or brace. Surgery. Follow these instructions at home: If you have a splint or brace: Wear the splint or brace as told by your health care provider. Remove it only as told by your health care provider. Loosen the splint or brace if your fingers tingle, become numb, or turn cold and blue. Keep the splint or brace clean. If the splint or brace is not waterproof: Do not let it get wet. Cover it with a watertight covering when you take a bath or shower. Managing pain, stiffness, and swelling If directed, put ice on the painful area. To do this: If you have a removeable splint or brace, remove it as told by your health care provider. Put ice in a plastic bag. Place a towel between your skin and the bag or between the splint or brace and the bag. Leave the ice on for 20 minutes, 2-3 times a day. Do not fall asleep with the cold pack on your skin. Remove the  ice if your skin turns bright red. This is very important. If you cannot feel pain, heat, or cold, you have a greater risk of damage to the area. Move your fingers often to reduce stiffness and swelling. General instructions Take over-the-counter and prescription medicines only as told by your health care  provider. Rest your wrist and hand from any activity that may be causing your pain. If your condition is work related, talk with your employer about changes that can be made, such as getting a wrist pad to use while typing. Do any exercises as told by your health care provider, physical therapist, or occupational therapist. Keep all follow-up visits. This is important. Contact a health care provider if: You have new symptoms. Your pain is not controlled with medicines. Your symptoms get worse. Get help right away if: You have severe numbness or tingling in your wrist or hand. Summary Carpal tunnel syndrome is a condition that causes pain, numbness, and weakness in your hand and fingers. It is usually caused by repeated wrist motions. Lifestyle changes and medicines are used to treat carpal tunnel syndrome. Surgery may be recommended. Follow your health care provider's instructions about wearing a splint, resting from activity, keeping follow-up visits, and calling for help. This information is not intended to replace advice given to you by your health care provider. Make sure you discuss any questions you have with your health care provider. Document Revised: 07/12/2019 Document Reviewed: 07/12/2019 Elsevier Patient Education  2022 [employer]  Electromyoneurogram Electromyoneurogram is a test to check how well your muscles and nerves are working. This procedure includes the combined use of electromyogram (EMG) and nerve conduction study (NCS). EMG is used to evaluate muscles and the nerves that control those muscles. NCS, which is also called electroneurogram, measures how well your nerves conduct electricity. The procedures should be done together to check if your muscles and nerves are healthy. If the results of the tests are abnormal, this may indicate disease or injury, such as a neuromuscular disease or peripheral nerve damage. Tell a health care provider about: Any allergies you  have. All medicines you are taking, including vitamins, herbs, eye drops, creams, and over-the-counter medicines. Any bleeding problems you have. Any surgeries you have had. Any medical conditions you have. What are the risks? Generally, this is a safe procedure. However, problems may occur, including: Bleeding or bruising. Infection where the electrodes were inserted. What happens before the test? Medicines Take all of your usually prescribed medications before this testing is performed. Do not stop your blood thinners unless advised by your prescribing physician. General instructions Your health care provider may ask you to warm the limb that will be checked with warm water, hot pack, or wrapping the limb in a blanket. Do not use lotions or creams on the same day that you will be having the procedure. What happens during the test? For EMG  Your health care provider will ask you to stay in a position so that the muscle being studied can be accessed. You will be sitting or lying down. You may be given a medicine to numb the area (local anesthetic) and the skin will be disinfected. A very thin needle that has an electrode will be inserted into your muscle, one muscle at a time. Typically, multiple muscles are evaluated during a single study. Another small electrode will be placed on your skin near the muscle. Your health care provider will ask you to continue to remain still. The electrodes will record  the electrical activity of your muscles. You may see this on a monitor or hear it in the room. After your muscles have been studied at rest, your health care provider will ask you to contract or flex your muscles. The electrodes will record the electrical activity of your muscles. Your health care provider will remove the electrodes and the electrode needle when the procedure is finished. The procedure may vary among health care providers and hospitals. For NCS  An electrode that records  your nerve activity (recording electrode) will be placed on your skin by the muscle that is being studied. An electrode that is used as a reference (reference electrode) will be placed near the recording electrode. A paste or gel will be applied to your skin between the recording electrode and the reference electrode. Your nerve will be stimulated with a mild shock. The speed of the nerves and strength of response is recorded by the electrodes. Your health care provider will remove the electrodes and the gel when the procedure is finished. The procedure may vary among health care providers and hospitals. What can I expect after the test? It is up to you to get your test results. Ask your health care provider, or the department that is doing the test, when your results will be ready. Your health care provider may: Give you medicines for any pain. Monitor the insertion sites to make sure that bleeding stops. You should be able to drive yourself to and from the test. Discomfort can persist for a few hours after the test, but should be better the next day. Contact a health care provider if: You have swelling, redness, or drainage at any of the insertion sites. Summary Electromyoneurogram is a test to check how well your muscles and nerves are working. If the results of the tests are abnormal, this may indicate disease or injury. This is a safe procedure. However, problems may occur, such as bleeding and infection. Your health care provider will do two tests to complete this procedure. One checks your muscles (EMG) and another checks your nerves (NCS). It is up to you to get your test results. Ask your health care provider, or the department that is doing the test, when your results will be ready. This information is not intended to replace advice given to you by your health care provider. Make sure you discuss any questions you have with your health care provider. Document Revised: 11/12/2020  Document Reviewed: 10/12/2020 Elsevier Patient Education  2022 [employer].

## 2021-04-30 NOTE — Progress Notes (Signed)
[facility]    Provider:  Dr Lucia Gaskins Requesting Provider: Aleen Campi Kermit Balo, PA-C Primary Care Provider:  Jac Canavan, PA-C  CC:  Migraines  The Emgality is doing great, she couldn't get it and it was frustrating and she was having a headache every day when she couldn't get it, when she got the emgality again the migraines have significantly improved maybe only 3-4 which is a tremendous improvement over daily and she has not woken up with a headache either, she uses the rizatriptan 4-6x a month.   She hs a new issues, she has spasms in her hands, cramping, never get numb or tingly, Sometimes she may get tingling fingers in the middle of the night or during day. We discussed likely CTS.  Patient complains of symptoms per HPI as well as the following symptoms: hand pain . Pertinent negatives and positives per HPI. All others negative   HPI:  Desiree Thomas is a 61 y.o. female here as requested by Jac Canavan, PA-C for chronic neck pain, paresthesias and pain of upper extremities and migraines.  Patient states she requested neurology for migraines.  She has already been evaluated for her neck pain and upper extremity pain at [facility] orthopedics.  She has a past medical history of chronic back and neck pain status post lumbar spine surgery fusion, depression, hyperlipidemia, hypertension and migraine.  Per review of Dr. Adelene Idler notes, she is established at [facility] orthopedics, prior care at [facility] and is established with pain management.  Per chart review, she did not care for the orthopedist bed [place] at [facility], she ended up having an MRI of the right shoulder and neck, and there was not any specific plan on how to move forward and does not have follow-up with them, she continues to have quite a bit of pain in her right upper arm and chest wall and the pain seems to radiate into the breast but mostly concentrated on the right upper arm and  shoulder, she does at times get tingly and weak in the right arm.  She has migraines, she uses Tylenol and if that does not help she goes to Maxalt or Seroquel, she requested neurology for migraines.  Patient is here and reports that she has had migraines for decades, she was extensively treated and imaged at the [facility] clinic for many years unfortunately I cannot access that through "Care Everywhere" and I will try to get those notes linked so I can review them, patient wakes up with them on the crown of the head into the neck throbbing pounding pulsating, can happen during the day as well it comes on acute, can last several hours for days, she went to the [facility] clinic for decades for treatments, she tried Botox in the past and had a side effect but it sounds like it was more weakness in her neck which we can avoid and I did encourage her to consider it again, she has a history of meningitis in her late 30s, migraines started worsening again with menopausal symptoms and now she is postmenopausal, patient has morning headaches, and at least 16 migraine days a month that can be moderate to severe, Lyrica helps, she has a lot of spine pain, she is also had extensive "nerve testing" at the [facility] clinic, she has had cervical fusion, she has severe pain, migraines can last days and treated and be 10 out of 10, no medication overuse, no aura, she has photophobia and phonophobia  and severe nausea and can vomit, she went to this extensive migraine clinic which was a 4-week 40 hours a week class and migraines outpatient full-time learning how to deal with migraines which include dietitians and dealing with mental illness.  Luckily she has not used CGRP, she is tried multiple other medications, I darkroom help, she has extensive family history and the mother sister are not 1 child with migraines, unknown triggers, but her stomach is always upset she has nausea, she has constipation, and associated  vertigo.    Reviewed notes, labs and imaging from outside physicians, which showed:  Medications that she is tried and failed include the following, not an inclusive list however,: Amitriptyline with weight gain, Topamax with cognitive issues, she was on verapamil for more than 20 years, she is on propranolol, amlodipine, she is tried Cymbalta, gabapentin, propranolol, Prozac, muscle relaxer such as Flexeril and methocarbamol, sumatriptan, rizatriptan, frovatriptan, almotriptan, multiple other triptans and various formats including p.o., injections, nasal sprays.  Once I get access to her [facility] clinic records I will know more.  Patient has an extensive history at the [facility] clinic unfortunately I cannot access those records, she spent years they are having migraine treatment, she was even involved in full-time 40 hours a week outpatient migraine therapy course.  I will see if we can get her [facility] clinic records linked into my chart so I can review them, she states she has had extensive imaging including her brain, we will hold off on any brain imaging until I get those records  I reviewed Dr. Adelene Idler notes, patient is a current smoker, she has chronic bilateral low back pain without sciatica, chronic neck pain, depression, right arm pain in addition to past medical history above in HPI.  She is on disability for history of chronic back pain, low back surgery and is on pain management and takes Lyrica 3 times a day and tramadol fairly regularly and does see a pain clinic.  Review of Systems: Patient complains of symptoms per HPI as well as the following symptoms chronic pain. Pertinent negatives and positives per HPI. All others negative.   Social History   Socioeconomic History   Marital status: Married    Spouse name: Not on file   Number of children: Not on file   Years of education: Not on file   Highest education level: Not on file  Occupational History   Not on file   Tobacco Use   Smoking status: Every Day    Packs/day: 0.25    Types: Cigarettes   Smokeless tobacco: Never   Tobacco comments:    Since 2012  Vaping Use   Vaping Use: Former  Substance and Sexual Activity   Alcohol use: Not Currently    Comment: seldom   Drug use: Yes    Frequency: 7.0 times per week    Types: Marijuana    Comment: a blunt per day, states it helps with her pain   Sexual activity: Yes  Other Topics Concern   Not on file  Social History Narrative   Married, Christian.  Disabled.  Has 2 children .  08/2020      Caffeine: 1 cup coffee/day, 1/2 pepsi per day, maybe some tea   Social Determinants of Health   Financial Resource Strain: Low Risk    Difficulty of Paying Living Expenses: Not hard at all  Food Insecurity: No Food Insecurity   Worried About Programme researcher, broadcasting/film/video in the Last Year: Never true  Ran Out of Food in the Last Year: Never true  Transportation Needs: No Transportation Needs   Lack of Transportation (Medical): No   Lack of Transportation (Non-Medical): No  Physical Activity: Inactive   Days of Exercise per Week: 0 days   Minutes of Exercise per Session: 0 min  Stress: No Stress Concern Present   Feeling of Stress : Not at all  Social Connections: Not on file  Intimate Partner Violence: Not on file    Family History  Problem Relation Age of Onset   Arthritis Mother    Heart disease Mother    Asthma Mother    Diverticulitis Mother    Migraines Mother    Diabetes Father    Diverticulitis Father    Arthritis Sister    Asthma Sister    Migraines Sister    Arthritis Sister    Asthma Sister    Cancer Maternal Grandmother        stomach   Diabetes Paternal Grandmother    Dementia Paternal Grandmother    Migraines Daughter     Past Medical History:  Diagnosis Date   Allergy    Asthma    Chronic back pain    Depression    GERD (gastroesophageal reflux disease)    Hearing loss    Hyperlipidemia    Hypertension    Migraine      Patient Active Problem List   Diagnosis Date Noted   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, conservative measures currently 05/03/2021   Skin lesion of right leg 03/02/2021   Pigmented skin lesions 03/02/2021   Stucco keratosis 02/20/2021   Skin lesion 02/17/2021   Chronic migraine without aura, with intractable migraine, so stated, with status migrainosus 12/22/2020   H/O: hysterectomy 11/25/2020   Encounter for health maintenance examination in adult 09/04/2020   Vitamin D deficiency 09/04/2020   Encounter for hepatitis C screening test for low risk patient 09/04/2020   Essential hypertension, benign 09/04/2020   Need for Td vaccine 09/04/2020   Chronic bilateral low back pain without sciatica 09/04/2020   Chronic neck pain 09/04/2020   Right arm pain 09/04/2020   Depression, major, single episode, in partial remission (HCC) 09/04/2020   Overactive bladder 09/04/2020   Smoker 06/12/2020   Gastroesophageal reflux disease 06/12/2020   Insomnia 06/12/2020   Migraine 06/12/2020   Nausea 06/12/2020   Hyperlipidemia 06/12/2020   Paresthesia and pain of both upper extremities 06/12/2020    Past Surgical History:  Procedure Laterality Date   ABDOMINAL HYSTERECTOMY     still has ovaries   APPENDECTOMY     COLONOSCOPY  2019   KNEE SURGERY  02/2013   KNEE SURGERY Bilateral    arthroscopy   LUMBAR FUSION  02/2014   LUMBAR SPINE SURGERY     fusion    Current Outpatient Medications  Medication Sig Dispense Refill   amLODipine (NORVASC) 10 MG tablet Take 1 tablet (10 mg total) by mouth daily. 90 tablet 3   atorvastatin (LIPITOR) 40 MG tablet TAKE 1 TABLET(40 MG) BY MOUTH DAILY 90 tablet 0   esomeprazole (NEXIUM) 40 MG capsule      FLUoxetine (PROZAC) 20 MG capsule TAKE 1 CAPSULE(20 MG) BY MOUTH DAILY 30 capsule 1   Galcanezumab-gnlm (EMGALITY) 120 MG/ML SOAJ Inject 120 mg into the skin every 30 (thirty) days. 1.12 mL 11   magnesium gluconate (MAGONATE) 500 MG  tablet Take 500 mg by mouth daily.     methocarbamol (ROBAXIN) 500 MG  tablet Take 1 tablet (500 mg total) by mouth in the morning and at bedtime. 60 tablet 2   ondansetron (ZOFRAN-ODT) 8 MG disintegrating tablet Take 8 mg by mouth every 8 (eight) hours as needed for nausea or vomiting.     OXYCODONE ER PO Take by mouth as needed.     pregabalin (LYRICA) 150 MG capsule TAKE 1 CAPSULE BY MOUTH EVERY MORNING, AFTERNOON AND EVENING. 90 capsule 5   propranolol ER (INDERAL LA) 60 MG 24 hr capsule TAKE 1 CAPSULE(60 MG) BY MOUTH AT BEDTIME 90 capsule 1   QUEtiapine (SEROQUEL) 25 MG tablet Take 25 mg by mouth as needed.     rizatriptan (MAXALT) 5 MG tablet Take 5 mg by mouth as needed for migraine. May repeat in 2 hours if needed     tolterodine (DETROL LA) 4 MG 24 hr capsule Take 1 capsule (4 mg total) by mouth daily. 30 capsule 2   traZODone (DESYREL) 50 MG tablet Take 100 mg by mouth at bedtime.     Ubrogepant (UBRELVY) 100 MG TABS Take 100 mg by mouth every 2 (two) hours as needed. Maximum 200mg  a day. 16 tablet 11   Vitamin D, Ergocalciferol, (DRISDOL) 1.25 MG (50000 UNIT) CAPS capsule Take 1 capsule (50,000 Units total) by mouth every 7 (seven) days. 12 capsule 3   No current facility-administered medications for this visit.    Allergies as of 04/30/2021 - Review Complete 04/30/2021  Allergen Reaction Noted   Morphine and related Anaphylaxis 06/12/2020   Penicillins Hives 06/12/2020   Topamax [topiramate]  02/17/2021   Vicodin hp [hydrocodone-acetaminophen] Itching 06/12/2020    Vitals: BP (!) 155/90 (BP Location: Right Arm, Patient Position: Sitting)    Pulse 67    Ht 5\' 2"  (1.575 m)    Wt 155 lb (70.3 kg)    BMI 28.35 kg/m  Last Weight:  Wt Readings from Last 1 Encounters:  04/30/21 155 lb (70.3 kg)   Last Height:   Ht Readings from Last 1 Encounters:  04/30/21 5\' 2"  (1.575 m)   Exam: NAD, pleasant                  Speech:    Speech is normal; fluent and spontaneous with normal  comprehension.  Cognition:    The patient is oriented to person, place, and time;     recent and remote memory intact;     language fluent;    Cranial Nerves:    The pupils are equal, round, and reactive to light.Trigeminal sensation is intact and the muscles of mastication are normal. The face is symmetric. The palate elevates in the midline. Hearing intact. Voice is normal. Shoulder shrug is normal. The tongue has normal motion without fasciculations.   Coordination:  No dysmetria  Motor Observation:    No asymmetry, no atrophy, and no involuntary movements noted. Tone:    Normal muscle tone.     Strength:    Strength is V/V in the upper and lower limbs.      Sensation: intact to LT  +Mcphalen's maneuver at wrists   Assessment/Plan: Patient with chronic intractable migraines.Patient has an extensive history at the [facility], she spent years they are having migraine treatment, she was even involved in full-time 40 hours a week outpatient migraine therapy course. she states she has had extensive imaging including her brain Failed plethora of medications likely we have some new medication she has not tried.  Preventative: doing great on Emgality Acute management: Continue  Bernita RaisinUbrelvy Both were approved CTS: Wear wrist splints for 6 weeks. Discussed, looked at images online, explained symptoms and distribution of sensory symptoms, +Mcphalen's maneuver  Medications that she has tried and failed include the following, not an inclusive list however,: Amitriptyline with weight gain, Topamax with cognitive issues, she was on verapamil for more than 20 years, she is on propranolol, amlodipine, she is tried Cymbalta, gabapentin, propranolol, Prozac, muscle relaxer such as Flexeril and methocarbamol, sumatriptan, rizatriptan, frovatriptan, almotriptan, multiple other triptans and various formats including p.o., injections, botox had neck weakness, nasal sprays.  Once I get access to her  [facility] records I will know more.  Discussed: To prevent or relieve headaches, try the following: Cool Compress. Lie down and place a cool compress on your head.  Avoid headache triggers. If certain foods or odors seem to have triggered your migraines in the past, avoid them. A headache diary might help you identify triggers.  Include physical activity in your daily routine. Try a daily walk or other moderate aerobic exercise.  Manage stress. Find healthy ways to cope with the stressors, such as delegating tasks on your to-do list.  Practice relaxation techniques. Try deep breathing, yoga, massage and visualization.  Eat regularly. Eating regularly scheduled meals and maintaining a healthy diet might help prevent headaches. Also, drink plenty of fluids.  Follow a regular sleep schedule. Sleep deprivation might contribute to headaches Consider biofeedback. With this mind-body technique, you learn to control certain bodily functions -- such as muscle tension, heart rate and blood pressure -- to prevent headaches or reduce headache pain.    Proceed to emergency room if you experience new or worsening symptoms or symptoms do not resolve, if you have new neurologic symptoms or if headache is severe, or for any concerning symptom.   Provided education and documentation from American headache Society toolbox including articles on: chronic migraine medication overuse headache, chronic migraines, prevention of migraines, behavioral and other nonpharmacologic treatments for headache.    No orders of the defined types were placed in this encounter.   No orders of the defined types were placed in this encounter.    Cc: Tysinger, Cleda Mccreedyavid S, PA-C,  Tysinger, Kermit Baloavid S, PA-C  Naomie DeanAntonia Braysen Cloward, MD  [facility] [address] [place], [place] [id number]  Phone [phone] Fax [phone]  I spent over 30 minutes of face-to-face and non-face-to-face time with  patient on the  1. Chronic migraine without aura, with intractable migraine, so stated, with status migrainosus   2. 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  This included previsit chart review, lab review, study review, order entry, electronic health record documentation, patient education on the different diagnostic and therapeutic options, counseling and coordination of care, risks and benefits of management, compliance, or risk factor reduction

## 2021-05-03 DIAGNOSIS — M79641 Pain in right hand: Secondary | ICD-10-CM | POA: Insufficient documentation

## 2021-05-04 ENCOUNTER — Telehealth: Payer: Self-pay

## 2021-05-04 NOTE — Telephone Encounter (Signed)
Pt left message needs refill Atorvastatin & Esomeprazole 40mg   & Solifenacin 5mg 

## 2021-05-05 ENCOUNTER — Other Ambulatory Visit: Payer: Self-pay | Admitting: Medical

## 2021-05-05 MED ORDER — ESOMEPRAZOLE MAGNESIUM 40 MG PO CPDR: 40.0000 mg | DELAYED_RELEASE_CAPSULE | Freq: Two times a day (BID) | ORAL | 0 refills | Status: DC

## 2021-05-05 MED ORDER — SOLIFENACIN SUCCINATE 5 MG PO TABS: 5.0000 mg | ORAL_TABLET | Freq: Every day | ORAL | 1 refills | Status: DC

## 2021-05-05 MED ORDER — ATORVASTATIN CALCIUM 40 MG PO TABS: ORAL_TABLET | ORAL | 0 refills | Status: DC

## 2021-05-05 NOTE — Telephone Encounter (Signed)
Is it ok to refill solifenacin. I do not see on list

## 2021-05-07 DIAGNOSIS — M5459 Other low back pain: Secondary | ICD-10-CM | POA: Diagnosis not present

## 2021-05-07 DIAGNOSIS — M549 Dorsalgia, unspecified: Secondary | ICD-10-CM | POA: Diagnosis not present

## 2021-05-07 DIAGNOSIS — M7918 Myalgia, other site: Secondary | ICD-10-CM | POA: Diagnosis not present

## 2021-05-07 DIAGNOSIS — G894 Chronic pain syndrome: Secondary | ICD-10-CM | POA: Diagnosis not present

## 2021-05-15 NOTE — Telephone Encounter (Signed)
done

## 2021-05-25 DIAGNOSIS — M19011 Primary osteoarthritis, right shoulder: Secondary | ICD-10-CM | POA: Diagnosis not present

## 2021-05-25 DIAGNOSIS — M67813 Other specified disorders of tendon, right shoulder: Secondary | ICD-10-CM | POA: Diagnosis not present

## 2021-05-25 DIAGNOSIS — M19019 Primary osteoarthritis, unspecified shoulder: Secondary | ICD-10-CM | POA: Diagnosis not present

## 2021-05-25 DIAGNOSIS — M4727 Other spondylosis with radiculopathy, lumbosacral region: Secondary | ICD-10-CM | POA: Diagnosis not present

## 2021-05-25 DIAGNOSIS — G894 Chronic pain syndrome: Secondary | ICD-10-CM | POA: Diagnosis not present

## 2021-05-25 DIAGNOSIS — M7918 Myalgia, other site: Secondary | ICD-10-CM | POA: Diagnosis not present

## 2021-06-21 ENCOUNTER — Other Ambulatory Visit: Payer: Self-pay | Admitting: Medical

## 2021-06-22 ENCOUNTER — Other Ambulatory Visit: Payer: Self-pay | Admitting: Medical

## 2021-06-23 ENCOUNTER — Other Ambulatory Visit: Payer: Self-pay | Admitting: Medical

## 2021-06-25 DIAGNOSIS — M961 Postlaminectomy syndrome, not elsewhere classified: Secondary | ICD-10-CM | POA: Diagnosis not present

## 2021-07-02 DIAGNOSIS — M961 Postlaminectomy syndrome, not elsewhere classified: Secondary | ICD-10-CM | POA: Diagnosis not present

## 2021-07-02 DIAGNOSIS — M545 Low back pain, unspecified: Secondary | ICD-10-CM | POA: Diagnosis not present

## 2021-07-02 DIAGNOSIS — M25519 Pain in unspecified shoulder: Secondary | ICD-10-CM | POA: Diagnosis not present

## 2021-07-08 DIAGNOSIS — M2551 Pain in shoulder: Secondary | ICD-10-CM | POA: Diagnosis not present

## 2021-07-08 DIAGNOSIS — M65351 Trigger finger, right little finger: Secondary | ICD-10-CM | POA: Diagnosis not present

## 2021-07-08 DIAGNOSIS — M25511 Pain in right shoulder: Secondary | ICD-10-CM | POA: Diagnosis not present

## 2021-07-08 DIAGNOSIS — M7918 Myalgia, other site: Secondary | ICD-10-CM | POA: Diagnosis not present

## 2021-07-08 DIAGNOSIS — M65341 Trigger finger, right ring finger: Secondary | ICD-10-CM | POA: Diagnosis not present

## 2021-07-20 ENCOUNTER — Other Ambulatory Visit: Payer: Self-pay | Admitting: Medical

## 2021-07-30 ENCOUNTER — Other Ambulatory Visit: Payer: Self-pay | Admitting: Medical

## 2021-07-30 DIAGNOSIS — M5417 Radiculopathy, lumbosacral region: Secondary | ICD-10-CM | POA: Diagnosis not present

## 2021-07-30 DIAGNOSIS — M961 Postlaminectomy syndrome, not elsewhere classified: Secondary | ICD-10-CM | POA: Diagnosis not present

## 2021-07-30 DIAGNOSIS — M25519 Pain in unspecified shoulder: Secondary | ICD-10-CM | POA: Diagnosis not present

## 2021-07-30 DIAGNOSIS — M545 Low back pain, unspecified: Secondary | ICD-10-CM | POA: Diagnosis not present

## 2021-07-30 DIAGNOSIS — Z79891 Long term (current) use of opiate analgesic: Secondary | ICD-10-CM | POA: Diagnosis not present

## 2021-07-30 DIAGNOSIS — M791 Myalgia, unspecified site: Secondary | ICD-10-CM | POA: Diagnosis not present

## 2021-08-19 ENCOUNTER — Other Ambulatory Visit: Payer: Self-pay | Admitting: Medical

## 2021-08-19 DIAGNOSIS — M542 Cervicalgia: Secondary | ICD-10-CM | POA: Diagnosis not present

## 2021-08-19 DIAGNOSIS — M65341 Trigger finger, right ring finger: Secondary | ICD-10-CM | POA: Diagnosis not present

## 2021-08-19 DIAGNOSIS — M65351 Trigger finger, right little finger: Secondary | ICD-10-CM | POA: Diagnosis not present

## 2021-08-20 DIAGNOSIS — M5417 Radiculopathy, lumbosacral region: Secondary | ICD-10-CM | POA: Diagnosis not present

## 2021-08-26 ENCOUNTER — Other Ambulatory Visit: Payer: Self-pay | Admitting: Orthopedic Surgery

## 2021-08-27 DIAGNOSIS — M961 Postlaminectomy syndrome, not elsewhere classified: Secondary | ICD-10-CM | POA: Diagnosis not present

## 2021-08-27 DIAGNOSIS — M545 Low back pain, unspecified: Secondary | ICD-10-CM | POA: Diagnosis not present

## 2021-08-27 DIAGNOSIS — Z79891 Long term (current) use of opiate analgesic: Secondary | ICD-10-CM | POA: Diagnosis not present

## 2021-08-27 DIAGNOSIS — M5417 Radiculopathy, lumbosacral region: Secondary | ICD-10-CM | POA: Diagnosis not present

## 2021-08-31 ENCOUNTER — Telehealth: Payer: Self-pay | Admitting: Medical

## 2021-08-31 NOTE — Telephone Encounter (Signed)
I received recent request for surgery clearance.  Get her on schedule for fasting physica and surgery preop.  Find a place for 30 min visit on schedule

## 2021-09-02 ENCOUNTER — Telehealth: Payer: Self-pay | Admitting: Neurology

## 2021-09-02 NOTE — Telephone Encounter (Signed)
Needs  PA for emaglity.  Last noted expires 03-2021

## 2021-09-02 NOTE — Telephone Encounter (Signed)
Pt states she was told to call and make Dr Lucia Gaskins aware when she is out of her Galcanezumab-gnlm (EMGALITY) 120 MG/ML SOAJ, please call pt to discuss

## 2021-09-02 NOTE — Telephone Encounter (Signed)
I called and was on hold to pharmacy for 9 min.  Will have to call back.

## 2021-09-03 ENCOUNTER — Telehealth: Payer: Self-pay

## 2021-09-07 ENCOUNTER — Ambulatory Visit (INDEPENDENT_AMBULATORY_CARE_PROVIDER_SITE_OTHER): Payer: Medicare HMO | Admitting: Medical

## 2021-09-07 VITALS — BP 120/70 | HR 76 | Wt 160.2 lb

## 2021-09-07 DIAGNOSIS — Z0000 Encounter for general adult medical examination without abnormal findings: Secondary | ICD-10-CM | POA: Diagnosis not present

## 2021-09-07 DIAGNOSIS — E559 Vitamin D deficiency, unspecified: Secondary | ICD-10-CM

## 2021-09-07 DIAGNOSIS — K219 Gastro-esophageal reflux disease without esophagitis: Secondary | ICD-10-CM

## 2021-09-07 DIAGNOSIS — Z01818 Encounter for other preprocedural examination: Secondary | ICD-10-CM

## 2021-09-07 DIAGNOSIS — F17200 Nicotine dependence, unspecified, uncomplicated: Secondary | ICD-10-CM

## 2021-09-07 DIAGNOSIS — I10 Essential (primary) hypertension: Secondary | ICD-10-CM | POA: Diagnosis not present

## 2021-09-07 DIAGNOSIS — N3281 Overactive bladder: Secondary | ICD-10-CM | POA: Diagnosis not present

## 2021-09-07 DIAGNOSIS — G43711 Chronic migraine without aura, intractable, with status migrainosus: Secondary | ICD-10-CM

## 2021-09-07 DIAGNOSIS — I8312 Varicose veins of left lower extremity with inflammation: Secondary | ICD-10-CM

## 2021-09-07 DIAGNOSIS — G4700 Insomnia, unspecified: Secondary | ICD-10-CM

## 2021-09-07 DIAGNOSIS — G43809 Other migraine, not intractable, without status migrainosus: Secondary | ICD-10-CM | POA: Diagnosis not present

## 2021-09-07 DIAGNOSIS — E785 Hyperlipidemia, unspecified: Secondary | ICD-10-CM

## 2021-09-07 DIAGNOSIS — M545 Low back pain, unspecified: Secondary | ICD-10-CM | POA: Diagnosis not present

## 2021-09-07 DIAGNOSIS — Z136 Encounter for screening for cardiovascular disorders: Secondary | ICD-10-CM | POA: Diagnosis not present

## 2021-09-07 DIAGNOSIS — F324 Major depressive disorder, single episode, in partial remission: Secondary | ICD-10-CM

## 2021-09-07 DIAGNOSIS — Z1231 Encounter for screening mammogram for malignant neoplasm of breast: Secondary | ICD-10-CM

## 2021-09-07 DIAGNOSIS — I8311 Varicose veins of right lower extremity with inflammation: Secondary | ICD-10-CM | POA: Insufficient documentation

## 2021-09-07 DIAGNOSIS — M542 Cervicalgia: Secondary | ICD-10-CM

## 2021-09-07 DIAGNOSIS — G8929 Other chronic pain: Secondary | ICD-10-CM

## 2021-09-07 DIAGNOSIS — Z90710 Acquired absence of both cervix and uterus: Secondary | ICD-10-CM

## 2021-09-07 DIAGNOSIS — Z122 Encounter for screening for malignant neoplasm of respiratory organs: Secondary | ICD-10-CM

## 2021-09-08 LAB — URINALYSIS
Bilirubin, UA: NEGATIVE
Glucose, UA: NEGATIVE
Ketones, UA: NEGATIVE
Nitrite, UA: NEGATIVE
Protein,UA: NEGATIVE
RBC, UA: NEGATIVE
Specific Gravity, UA: 1.012 (ref 1.005–1.030)
Urobilinogen, Ur: 0.2 mg/dL (ref 0.2–1.0)
pH, UA: 6.5 (ref 5.0–7.5)

## 2021-09-09 DIAGNOSIS — E785 Hyperlipidemia, unspecified: Secondary | ICD-10-CM | POA: Diagnosis not present

## 2021-09-09 DIAGNOSIS — Z0000 Encounter for general adult medical examination without abnormal findings: Secondary | ICD-10-CM | POA: Diagnosis not present

## 2021-09-09 DIAGNOSIS — E559 Vitamin D deficiency, unspecified: Secondary | ICD-10-CM | POA: Diagnosis not present

## 2021-09-10 LAB — COMPREHENSIVE METABOLIC PANEL
ALT: 21 IU/L (ref 0–32)
AST: 33 IU/L (ref 0–40)
Albumin/Globulin Ratio: 1.8 (ref 1.2–2.2)
Albumin: 4.3 g/dL (ref 3.8–4.9)
Alkaline Phosphatase: 78 IU/L (ref 44–121)
BUN/Creatinine Ratio: 13 (ref 9–23)
BUN: 10 mg/dL (ref 6–24)
Bilirubin Total: 0.2 mg/dL (ref 0.0–1.2)
CO2: 24 mmol/L (ref 20–29)
Calcium: 9.4 mg/dL (ref 8.7–10.2)
Chloride: 104 mmol/L (ref 96–106)
Creatinine, Ser: 0.77 mg/dL (ref 0.57–1.00)
Globulin, Total: 2.4 g/dL (ref 1.5–4.5)
Glucose: 91 mg/dL (ref 70–99)
Potassium: 4.3 mmol/L (ref 3.5–5.2)
Sodium: 141 mmol/L (ref 134–144)
Total Protein: 6.7 g/dL (ref 6.0–8.5)
eGFR: 89 mL/min/{1.73_m2} (ref >59)

## 2021-09-10 LAB — CBC WITH DIFFERENTIAL/PLATELET
Basophils Absolute: 0.1 10*3/uL (ref 0.0–0.2)
Basos: 1 %
EOS (ABSOLUTE): 0.2 10*3/uL (ref 0.0–0.4)
Eos: 3 %
Hematocrit: 35.5 % (ref 34.0–46.6)
Hemoglobin: 12.3 g/dL (ref 11.1–15.9)
Immature Grans (Abs): 0 10*3/uL (ref 0.0–0.1)
Immature Granulocytes: 0 %
Lymphocytes Absolute: 2.6 10*3/uL (ref 0.7–3.1)
Lymphs: 43 %
MCH: 29.1 pg (ref 26.6–33.0)
MCHC: 34.6 g/dL (ref 31.5–35.7)
MCV: 84 fL (ref 79–97)
Monocytes Absolute: 0.6 10*3/uL (ref 0.1–0.9)
Monocytes: 10 %
Neutrophils Absolute: 2.6 10*3/uL (ref 1.4–7.0)
Neutrophils: 43 %
Platelets: 289 10*3/uL (ref 150–450)
RBC: 4.23 x10E6/uL (ref 3.77–5.28)
RDW: 16.1 % — ABNORMAL HIGH (ref 11.7–15.4)
WBC: 6 10*3/uL (ref 3.4–10.8)

## 2021-09-10 LAB — LIPID PANEL
Chol/HDL Ratio: 2.8 ratio (ref 0.0–4.4)
Cholesterol, Total: 182 mg/dL (ref 100–199)
HDL: 66 mg/dL (ref >39)
LDL Chol Calc (NIH): 99 mg/dL (ref 0–99)
Triglycerides: 94 mg/dL (ref 0–149)
VLDL Cholesterol Cal: 17 mg/dL (ref 5–40)

## 2021-09-10 LAB — VITAMIN D 25 HYDROXY (VIT D DEFICIENCY, FRACTURES): Vit D, 25-Hydroxy: 55.5 ng/mL (ref 30.0–100.0)

## 2021-09-18 ENCOUNTER — Encounter (HOSPITAL_COMMUNITY): Payer: Self-pay | Admitting: Orthopedic Surgery

## 2021-09-18 NOTE — Progress Notes (Signed)
PCP - Crosby Oyster, PA-C Cardiologist - n/a Neurology - Dr Lucia Gaskins  Chest x-ray - n/a EKG - 09/07/21 Stress Test - n/a ECHO - n/a Cardiac Cath - n/a  ICD Pacemaker/Loop - n/a  Sleep Study -  n/a CPAP - none  ERAS: Clear liquids til 4:30 AM on DOS.  STOP now taking any Aspirin (unless otherwise instructed by your surgeon), Aleve, Naproxen, Ibuprofen, Motrin, Advil, Goody's, BC's, all herbal medications, fish oil, and all vitamins.   Coronavirus Screening Do you have any of the following symptoms:  Cough yes/no: No Fever (>100.27F)  yes/no: No Runny nose yes/no: No Sore throat yes/no: No Difficulty breathing/shortness of breath  yes/no: No  Have you traveled in the last 14 days and where? [place]  Patient verbalized understanding of instructions that were given via phone.

## 2021-09-20 NOTE — Anesthesia Preprocedure Evaluation (Signed)
Anesthesia Evaluation  Patient identified by MRN, date of birth, ID band Patient awake    Reviewed: Allergy & Precautions, H&P , NPO status , Patient's Chart, lab work & pertinent test results  Airway Mallampati: I  TM Distance: >3 FB Neck ROM: Full    Dental no notable dental hx. (+) Teeth Intact, Missing, Dental Advisory Given   Pulmonary neg pulmonary ROS, Current Smoker and Patient abstained from smoking.,    Pulmonary exam normal breath sounds clear to auscultation       Cardiovascular Exercise Tolerance: Good hypertension, Pt. on medications Normal cardiovascular exam Rhythm:Regular Rate:Normal     Neuro/Psych  Headaches, PSYCHIATRIC DISORDERS Depression  Neuromuscular disease    GI/Hepatic Neg liver ROS, GERD  Medicated,  Endo/Other  Morbid obesity  Renal/GU negative Renal ROS  negative genitourinary   Musculoskeletal  (+) Arthritis , Osteoarthritis,    Abdominal   Peds negative pediatric ROS (+)  Hematology negative hematology ROS (+)   Anesthesia Other Findings   Reproductive/Obstetrics negative OB ROS                            Anesthesia Physical Anesthesia Plan  ASA: 2  Anesthesia Plan: General   Post-op Pain Management: Tylenol PO (pre-op)* and Celebrex PO (pre-op)*   Induction: Intravenous  PONV Risk Score and Plan: 3 and Ondansetron and Dexamethasone  Airway Management Planned: LMA  Additional Equipment: None  Intra-op Plan:   Post-operative Plan: Extubation in OR  Informed Consent: I have reviewed the patients History and Physical, chart, labs and discussed the procedure including the risks, benefits and alternatives for the proposed anesthesia with the patient or authorized representative who has indicated his/her understanding and acceptance.       Plan Discussed with: CRNA, Surgeon and Anesthesiologist  Anesthesia Plan Comments: ( )         Anesthesia Quick Evaluation

## 2021-09-21 ENCOUNTER — Ambulatory Visit (HOSPITAL_COMMUNITY): Payer: Medicare HMO | Admitting: Anesthesiology

## 2021-09-21 ENCOUNTER — Encounter (HOSPITAL_COMMUNITY)
Admission: RE | Disposition: A | Discharge status: Home / Self Care | Payer: Self-pay | Source: Ambulatory Visit | Attending: Orthopedic Surgery

## 2021-09-21 ENCOUNTER — Ambulatory Visit (HOSPITAL_COMMUNITY)
Admission: RE | Admit: 2021-09-21 | Discharge: 2021-09-21 | Disposition: A | Discharge status: Home / Self Care | Payer: Medicare HMO | Source: Ambulatory Visit | Attending: Orthopedic Surgery | Admitting: Orthopedic Surgery

## 2021-09-21 ENCOUNTER — Ambulatory Visit (HOSPITAL_BASED_OUTPATIENT_CLINIC_OR_DEPARTMENT_OTHER): Payer: Medicare HMO | Admitting: Anesthesiology

## 2021-09-21 ENCOUNTER — Encounter (HOSPITAL_COMMUNITY): Payer: Self-pay | Admitting: Orthopedic Surgery

## 2021-09-21 ENCOUNTER — Other Ambulatory Visit: Payer: Self-pay

## 2021-09-21 DIAGNOSIS — M65331 Trigger finger, right middle finger: Secondary | ICD-10-CM | POA: Diagnosis not present

## 2021-09-21 DIAGNOSIS — M65341 Trigger finger, right ring finger: Secondary | ICD-10-CM | POA: Diagnosis not present

## 2021-09-21 DIAGNOSIS — M65321 Trigger finger, right index finger: Secondary | ICD-10-CM | POA: Insufficient documentation

## 2021-09-21 DIAGNOSIS — Z6829 Body mass index (BMI) 29.0-29.9, adult: Secondary | ICD-10-CM | POA: Diagnosis not present

## 2021-09-21 DIAGNOSIS — F32A Depression, unspecified: Secondary | ICD-10-CM | POA: Diagnosis not present

## 2021-09-21 DIAGNOSIS — F17210 Nicotine dependence, cigarettes, uncomplicated: Secondary | ICD-10-CM | POA: Diagnosis not present

## 2021-09-21 DIAGNOSIS — K219 Gastro-esophageal reflux disease without esophagitis: Secondary | ICD-10-CM | POA: Insufficient documentation

## 2021-09-21 DIAGNOSIS — Z79899 Other long term (current) drug therapy: Secondary | ICD-10-CM | POA: Diagnosis not present

## 2021-09-21 DIAGNOSIS — M65351 Trigger finger, right little finger: Secondary | ICD-10-CM | POA: Insufficient documentation

## 2021-09-21 DIAGNOSIS — M1990 Unspecified osteoarthritis, unspecified site: Secondary | ICD-10-CM | POA: Insufficient documentation

## 2021-09-21 DIAGNOSIS — I10 Essential (primary) hypertension: Secondary | ICD-10-CM | POA: Diagnosis not present

## 2021-09-21 HISTORY — DX: Unspecified osteoarthritis, unspecified site: M19.90

## 2021-09-21 HISTORY — PX: TRIGGER FINGER RELEASE: SHX641

## 2021-09-21 SURGERY — RELEASE, A1 PULLEY, FOR TRIGGER FINGER
Anesthesia: General | Site: Hand | Laterality: Right

## 2021-09-21 MED ORDER — VANCOMYCIN HCL IN DEXTROSE 1-5 GM/200ML-% IV SOLN
1000.0000 mg | Freq: Once | INTRAVENOUS | Status: AC
  Administered 2021-09-21 (×2): 1000 mg via INTRAVENOUS
  Filled 2021-09-21: qty 200

## 2021-09-21 MED ORDER — FENTANYL CITRATE (PF) 250 MCG/5ML IJ SOLN
INTRAMUSCULAR | Status: DC | PRN
  Administered 2021-09-21: 100 ug via INTRAVENOUS

## 2021-09-21 MED ORDER — PROMETHAZINE HCL 12.5 MG PO TABS: 12.5000 mg | ORAL_TABLET | Freq: Four times a day (QID) | ORAL | 0 refills | Status: DC | PRN

## 2021-09-21 MED ORDER — OXYCODONE HCL 5 MG PO TABS: 5.0000 mg | ORAL_TABLET | Freq: Once | ORAL | Status: DC | PRN

## 2021-09-21 MED ORDER — BETAMETHASONE SOD PHOS & ACET 6 (3-3) MG/ML IJ SUSP
12.0000 mg | Freq: Once | INTRAMUSCULAR | Status: DC
Start: 2021-09-21 — End: 2021-09-21
  Filled 2021-09-21: qty 2

## 2021-09-21 MED ORDER — ONDANSETRON HCL 4 MG/2ML IJ SOLN
INTRAMUSCULAR | Status: AC
  Filled 2021-09-21: qty 2

## 2021-09-21 MED ORDER — ACETAMINOPHEN 160 MG/5ML PO SOLN: 325.0000 mg | ORAL | Status: DC | PRN

## 2021-09-21 MED ORDER — BETAMETHASONE SOD PHOS & ACET 6 (3-3) MG/ML IJ SUSP
INTRAMUSCULAR | Status: DC | PRN
  Administered 2021-09-21: 4 mL

## 2021-09-21 MED ORDER — DEXAMETHASONE SODIUM PHOSPHATE 10 MG/ML IJ SOLN
INTRAMUSCULAR | Status: DC | PRN
  Administered 2021-09-21: 10 mg via INTRAVENOUS

## 2021-09-21 MED ORDER — PHENYLEPHRINE HCL-NACL 20-0.9 MG/250ML-% IV SOLN: INTRAVENOUS | Status: DC | PRN

## 2021-09-21 MED ORDER — BUPIVACAINE HCL (PF) 0.25 % IJ SOLN
INTRAMUSCULAR | Status: DC | PRN
  Administered 2021-09-21: 4 mL

## 2021-09-21 MED ORDER — BUPIVACAINE-EPINEPHRINE (PF) 0.25% -1:200000 IJ SOLN
INTRAMUSCULAR | Status: AC
  Filled 2021-09-21: qty 30

## 2021-09-21 MED ORDER — OXYCODONE HCL 5 MG/5ML PO SOLN: 5.0000 mg | Freq: Once | ORAL | Status: DC | PRN

## 2021-09-21 MED ORDER — MEPERIDINE HCL 25 MG/ML IJ SOLN: 6.2500 mg | INTRAMUSCULAR | Status: DC | PRN

## 2021-09-21 MED ORDER — FENTANYL CITRATE (PF) 100 MCG/2ML IJ SOLN
INTRAMUSCULAR | Status: AC
  Filled 2021-09-21: qty 2

## 2021-09-21 MED ORDER — BUPIVACAINE-EPINEPHRINE (PF) 0.25% -1:200000 IJ SOLN
INTRAMUSCULAR | Status: DC | PRN
  Administered 2021-09-21: 10 mL

## 2021-09-21 MED ORDER — LACTATED RINGERS IV SOLN: INTRAVENOUS | Status: DC

## 2021-09-21 MED ORDER — TRAMADOL HCL 50 MG PO TABS: 50.0000 mg | ORAL_TABLET | Freq: Four times a day (QID) | ORAL | 0 refills | Status: DC | PRN

## 2021-09-21 MED ORDER — CELECOXIB 200 MG PO CAPS
200.0000 mg | ORAL_CAPSULE | Freq: Once | ORAL | Status: AC
  Administered 2021-09-21: 200 mg via ORAL
  Filled 2021-09-21: qty 1

## 2021-09-21 MED ORDER — ONDANSETRON HCL 4 MG/2ML IJ SOLN
4.0000 mg | Freq: Once | INTRAMUSCULAR | Status: AC | PRN
Start: 2021-09-21 — End: 2021-09-21
  Administered 2021-09-21: 4 mg via INTRAVENOUS

## 2021-09-21 MED ORDER — ONDANSETRON HCL 4 MG/2ML IJ SOLN
INTRAMUSCULAR | Status: DC | PRN
  Administered 2021-09-21: 4 mg via INTRAVENOUS

## 2021-09-21 MED ORDER — BUPIVACAINE HCL (PF) 0.25 % IJ SOLN
INTRAMUSCULAR | Status: AC
  Filled 2021-09-21: qty 10

## 2021-09-21 MED ORDER — LIDOCAINE 2% (20 MG/ML) 5 ML SYRINGE
INTRAMUSCULAR | Status: DC | PRN
  Administered 2021-09-21: 80 mg via INTRAVENOUS

## 2021-09-21 MED ORDER — FENTANYL CITRATE (PF) 100 MCG/2ML IJ SOLN
25.0000 ug | INTRAMUSCULAR | Status: DC | PRN
  Administered 2021-09-21: 50 ug via INTRAVENOUS

## 2021-09-21 MED ORDER — FENTANYL CITRATE (PF) 250 MCG/5ML IJ SOLN
INTRAMUSCULAR | Status: AC
  Filled 2021-09-21: qty 5

## 2021-09-21 MED ORDER — ORAL CARE MOUTH RINSE: 15.0000 mL | Freq: Once | OROMUCOSAL | Status: AC

## 2021-09-21 MED ORDER — PHENYLEPHRINE 80 MCG/ML (10ML) SYRINGE FOR IV PUSH (FOR BLOOD PRESSURE SUPPORT)
PREFILLED_SYRINGE | INTRAVENOUS | Status: DC | PRN
  Administered 2021-09-21 (×3): 160 ug via INTRAVENOUS

## 2021-09-21 MED ORDER — ACETAMINOPHEN 325 MG PO TABS: 325.0000 mg | ORAL_TABLET | ORAL | Status: DC | PRN

## 2021-09-21 MED ORDER — PROPOFOL 10 MG/ML IV BOLUS
INTRAVENOUS | Status: DC | PRN
  Administered 2021-09-21: 160 mg via INTRAVENOUS

## 2021-09-21 MED ORDER — ACETAMINOPHEN 500 MG PO TABS
1000.0000 mg | ORAL_TABLET | Freq: Once | ORAL | Status: AC
Start: 2021-09-21 — End: 2021-09-21
  Administered 2021-09-21: 1000 mg via ORAL
  Filled 2021-09-21: qty 2

## 2021-09-21 MED ORDER — CHLORHEXIDINE GLUCONATE 0.12 % MT SOLN
15.0000 mL | Freq: Once | OROMUCOSAL | Status: AC
Start: 2021-09-21 — End: 2021-09-21
  Administered 2021-09-21: 15 mL via OROMUCOSAL
  Filled 2021-09-21: qty 15

## 2021-09-21 SURGICAL SUPPLY — 42 items
BAG COUNTER SPONGE SURGICOUNT (BAG) ×2 IMPLANT
BLADE CLIPPER SURG (BLADE) IMPLANT
BNDG COHESIVE 3X5 TAN STRL LF (GAUZE/BANDAGES/DRESSINGS) ×2 IMPLANT
BNDG CONFORM 3 STRL LF (GAUZE/BANDAGES/DRESSINGS) ×2 IMPLANT
BNDG ELASTIC 3X5.8 VLCR STR LF (GAUZE/BANDAGES/DRESSINGS) ×1 IMPLANT
BNDG ESMARK 4X9 LF (GAUZE/BANDAGES/DRESSINGS) ×2 IMPLANT
CORD BIPOLAR FORCEPS 12FT (ELECTRODE) ×2 IMPLANT
COVER SURGICAL LIGHT HANDLE (MISCELLANEOUS) ×2 IMPLANT
CUFF TOURN SGL QUICK 18X4 (TOURNIQUET CUFF) ×2 IMPLANT
CUFF TOURN SGL QUICK 24 (TOURNIQUET CUFF)
CUFF TRNQT CYL 24X4X16.5-23 (TOURNIQUET CUFF) IMPLANT
DRAPE U-SHAPE 47X51 STRL (DRAPES) ×2 IMPLANT
DRSG XEROFORM 1X8 (GAUZE/BANDAGES/DRESSINGS) ×1 IMPLANT
ELECT REM PT RETURN 9FT ADLT (ELECTROSURGICAL)
ELECTRODE REM PT RTRN 9FT ADLT (ELECTROSURGICAL) IMPLANT
GAUZE SPONGE 4X4 12PLY STRL (GAUZE/BANDAGES/DRESSINGS) ×2 IMPLANT
GAUZE XEROFORM 1X8 LF (GAUZE/BANDAGES/DRESSINGS) ×2 IMPLANT
GLOVE BIO SURGEON STRL SZ7 (GLOVE) ×2 IMPLANT
GLOVE BIO SURGEON STRL SZ7.5 (GLOVE) ×2 IMPLANT
GLOVE BIOGEL PI IND STRL 8 (GLOVE) ×1 IMPLANT
GLOVE BIOGEL PI INDICATOR 8 (GLOVE) ×1
GOWN STRL REUS W/ TWL LRG LVL3 (GOWN DISPOSABLE) ×4 IMPLANT
GOWN STRL REUS W/ TWL XL LVL3 (GOWN DISPOSABLE) ×1 IMPLANT
GOWN STRL REUS W/TWL LRG LVL3 (GOWN DISPOSABLE) ×4
GOWN STRL REUS W/TWL XL LVL3 (GOWN DISPOSABLE) ×1
KIT BASIN OR (CUSTOM PROCEDURE TRAY) ×2 IMPLANT
KIT TURNOVER KIT B (KITS) ×2 IMPLANT
MANIFOLD NEPTUNE II (INSTRUMENTS) ×2 IMPLANT
NDL HYPO 25GX1X1/2 BEV (NEEDLE) IMPLANT
NEEDLE HYPO 25GX1X1/2 BEV (NEEDLE) ×2 IMPLANT
NS IRRIG 1000ML POUR BTL (IV SOLUTION) ×2 IMPLANT
PACK ORTHO EXTREMITY (CUSTOM PROCEDURE TRAY) ×2 IMPLANT
PAD ARMBOARD 7.5X6 YLW CONV (MISCELLANEOUS) ×4 IMPLANT
SPONGE T-LAP 4X18 ~~LOC~~+RFID (SPONGE) ×2 IMPLANT
SUCTION FRAZIER HANDLE 10FR (MISCELLANEOUS) ×1
SUCTION TUBE FRAZIER 10FR DISP (MISCELLANEOUS) ×1 IMPLANT
SUT ETHILON 4 0 PS 2 18 (SUTURE) ×2 IMPLANT
SYR CONTROL 10ML LL (SYRINGE) IMPLANT
TOWEL GREEN STERILE (TOWEL DISPOSABLE) ×2 IMPLANT
TOWEL GREEN STERILE FF (TOWEL DISPOSABLE) ×2 IMPLANT
TUBE CONNECTING 12X1/4 (SUCTIONS) ×2 IMPLANT
WATER STERILE IRR 1000ML POUR (IV SOLUTION) ×2 IMPLANT

## 2021-09-21 NOTE — Transfer of Care (Signed)
Immediate Anesthesia Transfer of Care Note  Patient: Desiree Thomas  Procedure(s) Performed: RIGHT RING AND SMALL TRIGGER FINGER RELEASE and INJECTIONS OF RIGHT INDEX AND LONG FINGER (Right: Hand)  Patient Location: PACU  Anesthesia Type:General  Level of Consciousness: awake, alert  and oriented  Airway & Oxygen Therapy: Patient Spontanous Breathing  Post-op Assessment: Report given to RN and Post -op Vital signs reviewed and stable  Post vital signs: Reviewed and stable  Last Vitals:  Vitals Value Taken Time  BP    Temp    Pulse 79 09/21/21 0834  Resp 17 09/21/21 0834  SpO2 95 % 09/21/21 0834  Vitals shown include unvalidated device data.  Last Pain:  Vitals:   09/21/21 0637  TempSrc:   PainSc: 7       Patients Stated Pain Goal: 3 (09/21/21 4097)  Complications: No notable events documented.

## 2021-09-21 NOTE — Progress Notes (Signed)
Orthopedic Tech Progress Note Patient Details:  Desiree Thomas 09/14/1960 [id number]  PACU RN called requesting an ARM SLING   Ortho Devices Type of Ortho Device: Arm sling Ortho Device/Splint Location: RUE Ortho Device/Splint Interventions: Ordered   Post Interventions Patient Tolerated: Well Instructions Provided: Care of device  [name] 09/21/2021, 10:01 AM

## 2021-09-21 NOTE — Anesthesia Procedure Notes (Signed)
Procedure Name: LMA Insertion Date/Time: 09/21/2021 7:35 AM  Performed by: Darryl Nestle, CRNAPre-anesthesia Checklist: Patient identified, Emergency Drugs available, Suction available and Patient being monitored Patient Re-evaluated:Patient Re-evaluated prior to induction Oxygen Delivery Method: Circle system utilized Preoxygenation: Pre-oxygenation with 100% oxygen Induction Type: IV induction Ventilation: Mask ventilation without difficulty LMA: LMA inserted LMA Size: 4.0 Tube type: Oral Number of attempts: 2 Placement Confirmation: positive ETCO2 and breath sounds checked- equal and bilateral Tube secured with: Tape Dental Injury: Teeth and Oropharynx as per pre-operative assessment

## 2021-09-21 NOTE — Discharge Instructions (Signed)
Discharge instructions for Dr. Shonya Sumida, M.D.: Please refer to the two-sided discharge instructions paper that Dr. Marl Seago placed in the patient's paper chart. Please give this to the patient to take home after reviewing with the patient!!   General discharge instructions:  PLEASE REFER TO TWO-SIDED PAPER INSTRUCTIONS IN PAPER CHART FOR SPECIFIC INSTRUCTIONS!!!  Diet: As you were doing prior to hospitalization. Shower:  Unless otherwise specified (i.e. on two-sided paper instructions with paper chart) may shower but keep the wounds dry, use an occlusive plastic wrap, NO SOAKING IN TUB.  If the bandage gets wet, change with a clean dry gauze. Dressing:  Unless otherwise specified (i.e. on two-sided paper instructions with paper chart), may change your dressing 3-5 days after surgery.  Then change the dressing daily with sterile gauze dressing.  If there are sticky tapes (steri-strips) on your wounds and all the stitches are absorbable.  Leave the steri-strips in place when changing your dressings, they will peel off with time, usually 2-3 weeks. Activity:  Increase activity slowly as tolerated, but follow the restrictions on the two-sided paper discharge instructions sheet that Dr. Stepen Prins placed in the paper chart.  No lifting or driving for 6 weeks. Weight Bearing: WEIGHTBEARING AS TOLERATED (WBAT) on the RIGHT UPPER EXTREMITY. To prevent constipation: You may use over-the-counter stool softener(s) such as Colace (over the counter) 100 mg by mouth twice a day and/or Miralax (over the counter) for constipation as needed.  Drink plenty of fluids (prune juice may be helpful) and high fiber foods.  Itching:  If you experience itching with your medications, try taking only a single pain pill, or even half a pain pill at a time.  You can also use benadryl over the counter for itching or also to help with sleep.  Precautions:  If you experience chest pain or shortness of breath - call 911 immediately for  transfer to the hospital emergency department!!  PLEASE REFER TO TWO-SIDED PAPER INSTRUCTIONS IN PAPER CHART FOR SPECIFIC INSTRUCTIONS!!!  If you develop a fever greater that 101.1 deg F, purulent drainage from wound, increased redness or drainage from wound, or calf pain -- Call the office at [phone].  

## 2021-09-21 NOTE — Anesthesia Postprocedure Evaluation (Signed)
Anesthesia Post Note  Patient: Desiree Thomas  Procedure(s) Performed: RIGHT RING AND SMALL TRIGGER FINGER RELEASE and INJECTIONS OF RIGHT INDEX AND LONG FINGER (Right: Hand)     Patient location during evaluation: PACU Anesthesia Type: General Level of consciousness: awake and alert Pain management: pain level controlled Vital Signs Assessment: post-procedure vital signs reviewed and stable Respiratory status: spontaneous breathing, nonlabored ventilation, respiratory function stable and patient connected to nasal cannula oxygen Cardiovascular status: blood pressure returned to baseline and stable Postop Assessment: no apparent nausea or vomiting Anesthetic complications: no   No notable events documented.  Last Vitals:  Vitals:   09/21/21 0850 09/21/21 0905  BP: 100/84 128/87  Pulse: 73 68  Resp: 17 15  Temp:  36.5 C  SpO2: 93% 95%    Last Pain:  Vitals:   09/21/21 0905  TempSrc:   PainSc: 0-No pain                 Kyon Bentler

## 2021-09-21 NOTE — Op Note (Signed)
OPERATIVE NOTE  Desiree Thomas female 61 y.o. 09/21/2021  PREOPERATIVE DIAGNOSIS: Right hand index finger trigger finger Right hand long finger trigger finger Right hand ring finger trigger finger Right hand small finger trigger finger  POSTOPERATIVE DIAGNOSIS: Right hand index finger trigger finger (M65.321) Right hand long finger trigger finger (M65.331) Right hand ring finger trigger finger (M65.341) Right hand small finger trigger finger (65.351)  PROCEDURE(S): Right hand ring finger A1 pulley release (32440) Right hand small finger A1 pulley release (10272) Right hand index finger A1 pulley/flexor tendon sheath injection (53664) Right hand long finger A1 pulley/flexor tendon sheath injection (40347)  SURGEON: Ernestina Columbia, M.D.  ASSISTANT(S): None  ANESTHESIA: General  FINDINGS: Preoperative Examination: RUE: Examination of the right hand demonstrates skin is intact and benign.  There is persistent tenderness over the ring and small finger A1 pulleys.  There is also new tenderness over the index and long finger A1 pulleys.  Otherwise normal MCP, PIP, and DIP motion and strength.  Sensation intact to light touch through all aspects of all digits.  Fingers are warm and well-perfused.  Operative Findings: Nodular hypertrophy of the ring and small finger flexor tendons in the region of the A1 pulley.  Full release of A1 pulley resulting in full unrestricted motion of the flexor tendons without any triggering.  IMPLANTS: * No implants in log *  INDICATIONS:  The patient is a 61 y.o. female who initially presented with right hand painful triggering of the ring and small fingers.  She initially underwent conservative treatment including anti-inflammatory medications and injections.  Unfortunately she did have a recurrence of the painful triggering, which bothers her frequently on a daily basis.  Both nonoperative and operative treatments were discussed.  We discussed  possibility of continuing nonoperative treatment with subsequent injections, but due to the painful triggering and history of successful surgical treatment of trigger finger of her thumb on the same hand, she did wish to pursue more definitive treatment in the form of surgery with A1 pulley release of the ring and small fingers.  In the interim, she has also developed pain but not significant triggering of the right hand index and long fingers at the A1 pulley.  She understood the risks, benefits and alternatives to surgery which include but are not limited to bleeding, wound healing complications, infection, damage to surrounding structures, persistent pain, stiffness, lack of improvement, potential for subsequent arthritis or worsening of pre-existing arthritis, and need for further surgery, as well as complications related to anesthesia, cardiovascular complications, and death.  She also understood the potential for continued pain, and that there were no guarantees of acceptable outcome.  After weighing these risks the patient opted to proceed with surgery, with release of the right hand ring and small finger A1 pulleys, and injections of the index and long finger A1 pulley/flexor tendon sheath.  TECHNIQUE: Patient was identified in the preoperative holding area.  The right hand was marked by myself.  Consent was signed by myself and the patient.  No block was performed by anesthesia in the preoperative holding area.  Patient was taken to the operative suite and placed supine on the operative table.  Anesthesia was induced by the anesthesia team.  The patient was positioned appropriately for the procedure and all bony prominences were well padded.   Nonsterile forearm tourniquet was placed on the operative extremity.   Preoperative antibiotics were given.  Surgical timeout was performed, and appropriate laterality and procedures for each of the fingers was confirmed.  Prior to prepping and draping, the skin  over the A1 pulleys of the right hand index and long fingers was sterilely prepped with alcohol, and a mixture of 2 mg betamethasone with plain bupivacaine was injected into the A1 pulley and flexor tendon sheath of the index and long fingers.  The extremity was then prepped and draped in the usual sterile fashion and repeat surgical timeout was performed.  Small incisions were marked out proximal to the ring and small fingers on the palm at the distal palmar crease.  These were infiltrated with quarter percent Marcaine with epinephrine.  Release of the small finger was performed first.  Skin was incised sharply.  Underlying subcutaneous fat was dissected bluntly with tenotomy's and sponge down onto the A1 pulley.  Neurovascular structures were protected with retractors placed medially and laterally.  Margins of the A1 pulley were carefully cleaned off with the sponge and were fully visualized.  A leading edge was created with a clean #15 blade.  Complete release of the A1 pulley was then performed with the tenotomy scissors.  This resulted in full free motion of the flexor tendon without any catching or triggering.  The same process was repeated on the ring finger, also resulting in full free motion of the flexor tendon without any catching or triggering.  The wounds were copiously irrigated and closed with #4-0 nylon with horizontal mattress stitches, taking care to evert the skin edges.  Dry sterile dressing consisting of Xeroform, 4 x 4's, Kerlix gauze, and Ace wrap was placed on the hand.  Tourniquet was let down.  Patient was awakened from anesthesia and transferred to PACU in stable condition.  She tolerated procedure well.  There were no complications.  POST OPERATIVE INSTRUCTIONS: Pain control: Continue to wean/titrate to appropriate oral regimen DVT Prophylaxis: Not required RUE: Weightbearing as tolerated, no restrictions Dressing care:  Keep the dressing on until follow-up in clinic. Disposition:  Home Follow-up: Please call [facility] [phone]) to schedule follow-op appointment for 2 weeks after surgery.  TOURNIQUET TIME:  Total Tourniquet Time Documented: Upper Arm (Right) - 28 minutes Total: Upper Arm (Right) - 28 minutes   BLOOD LOSS: < 1 mL         DRAINS: none         SPECIMEN: none       COMPLICATIONS:  * No complications entered in OR log *         DISPOSITION: PACU - hemodynamically stable.         CONDITION: stable   Ernestina Columbia M.D. Orthopaedic Surgery [facility]   Portions of the record have been created with voice recognition software.  Grammatical and punctuation errors, random word insertions, wrong-word or "sound-a-like" substitutions, pronoun errors (inaccuracies and/or substitutions), and/or incomplete sentences may have occurred due to the inherent limitations of voice recognition software.  Not all errors are caught or corrected.  Although every attempt is made to root out erroneous and incomplete transcription, the note may still not fully represent the intent or opinion of the author.  Read the chart carefully and recognize, using context, where errors/substitutions have occurred.  Any questions or concerns about the content of this note or information contained within the body of this dictation should be addressed directly with the author for clarification.

## 2021-09-21 NOTE — H&P (Signed)
PREOPERATIVE H&P  HPI: Desiree Thomas is a 61 y.o. female who has presented today for surgery, with the diagnosis of right hand ring and small finger trigger fingers.  More recently, she has been experiencing pain in the A1 pulleys of the right hand index and long fingers, but no active triggering.  This has developed since I last saw her in clinic.  The various methods of treatment have been discussed with the patient and family.  After consideration of risks, benefits, and other options for treatment, the patient has consented to RIGHT RING AND SMALL FINGER TRIGGER FINGER RELEASE as a surgical intervention.  She would also like to have injections in the right hand index and long finger A1 pulleys.  The patient's history has been reviewed, patient examined, no change in status, stable for surgery.  I have reviewed the patient's chart and labs.  Questions were answered to the patient's satisfaction.    PMH: Past Medical History:  Diagnosis Date   Allergy    Arthritis    Asthma    no inhaler - as a young adult, no current problems   Chronic back pain    Depression    GERD (gastroesophageal reflux disease)    no meds, diet controlled   Hearing loss    Hyperlipidemia    Hypertension    Migraine     Home Medications Allergies  No current facility-administered medications on file prior to encounter.   Current Outpatient Medications on File Prior to Encounter  Medication Sig Dispense Refill   amLODipine (NORVASC) 10 MG tablet Take 1 tablet (10 mg total) by mouth daily. 90 tablet 3   atorvastatin (LIPITOR) 40 MG tablet TAKE 1 TABLET(40 MG) BY MOUTH DAILY 90 tablet 0   docusate sodium (PHILLIPS STOOL SOFTENER) 100 MG capsule Take 100 mg by mouth daily.     FLUoxetine (PROZAC) 20 MG capsule TAKE 1 CAPSULE(20 MG) BY MOUTH DAILY 30 capsule 1   loratadine (CLARITIN) 10 MG tablet Take 10 mg by mouth daily.     magnesium gluconate (MAGONATE) 500 MG tablet Take 500 mg by mouth daily.      methocarbamol (ROBAXIN) 750 MG tablet Take 750 mg by mouth in the morning and at bedtime.     naproxen sodium (ALEVE) 220 MG tablet Take 440 mg by mouth daily as needed (pain).     oxyCODONE-acetaminophen (PERCOCET/ROXICET) 5-325 MG tablet Take 1 tablet by mouth 2 (two) times daily as needed for severe pain.     pregabalin (LYRICA) 150 MG capsule TAKE 1 CAPSULE BY MOUTH EVERY MORNING, AFTERNOON AND EVENING. 90 capsule 5   propranolol ER (INDERAL LA) 60 MG 24 hr capsule TAKE 1 CAPSULE(60 MG) BY MOUTH AT BEDTIME 90 capsule 0   QUEtiapine (SEROQUEL) 25 MG tablet Take 25 mg by mouth at bedtime as needed (sleep).     solifenacin (VESICARE) 5 MG tablet Take 1 tablet (5 mg total) by mouth daily. 90 tablet 1   traZODone (DESYREL) 50 MG tablet Take 50 mg by mouth at bedtime.     trolamine salicylate (ASPERCREME) 10 % cream Apply 1 Application topically as needed for muscle pain.     Turmeric Curcumin 500 MG CAPS Take 500 mg by mouth daily.     Vitamin D, Ergocalciferol, (DRISDOL) 1.25 MG (50000 UNIT) CAPS capsule TAKE 1 CAPSULE BY MOUTH EVERY 7 DAYS (Patient taking differently: Take 50,000 Units by mouth every Saturday.) 12 capsule 1   esomeprazole (NEXIUM) 40 MG capsule Take 1 capsule (  40 mg total) by mouth 2 (two) times daily before a meal. (Patient not taking: Reported on 09/11/2021) 90 capsule 0   ondansetron (ZOFRAN-ODT) 8 MG disintegrating tablet Take 8 mg by mouth every 8 (eight) hours as needed for nausea or vomiting.     rizatriptan (MAXALT) 5 MG tablet Take 5 mg by mouth as needed for migraine. May repeat in 2 hours if needed     Allergies  Allergen Reactions   Morphine And Related Anaphylaxis   Penicillins Hives and Itching   Topamax [Topiramate] Other (See Comments)    Emotional    Vicodin Hp [Hydrocodone-Acetaminophen] Itching    Severe      PSH: Past Surgical History:  Procedure Laterality Date   ABDOMINAL HYSTERECTOMY     still has ovaries   APPENDECTOMY     COLONOSCOPY  2019    KNEE SURGERY  02/2013   KNEE SURGERY Bilateral    arthroscopy   LUMBAR FUSION  02/2014   LUMBAR SPINE SURGERY     fusion     Family History Social History  Family History  Problem Relation Age of Onset   Arthritis Mother    Heart disease Mother    Asthma Mother    Diverticulitis Mother    Migraines Mother    Diabetes Father    Diverticulitis Father    Arthritis Sister    Asthma Sister    Migraines Sister    Arthritis Sister    Asthma Sister    Cancer Maternal Grandmother        stomach   Diabetes Paternal Grandmother    Dementia Paternal Grandmother    Migraines Daughter     Social History   Socioeconomic History   Marital status: Married    Spouse name: Not on file   Number of children: Not on file   Years of education: Not on file   Highest education level: Not on file  Occupational History   Not on file  Tobacco Use   Smoking status: Every Day    Packs/day: 0.25    Years: 11.00    Total pack years: 2.75    Types: Cigarettes   Smokeless tobacco: Never   Tobacco comments:    Since 2012  Vaping Use   Vaping Use: Some days   Substances: Nicotine, Flavoring  Substance and Sexual Activity   Alcohol use: Not Currently    Comment: seldom   Drug use: Yes    Frequency: 7.0 times per week    Types: Marijuana    Comment: a blunt per day, states it helps with her pain   Sexual activity: Yes    Birth control/protection: Surgical    Comment: hysterectomy  Other Topics Concern   Not on file  Social History Narrative   Married, Christian.  Disabled.  Has 2 children .  08/2020      Caffeine: 1 cup coffee/day, 1/2 pepsi per day, maybe some tea   Social Determinants of Health   Financial Resource Strain: Low Risk  (04/10/2021)   Overall Financial Resource Strain (CARDIA)    Difficulty of Paying Living Expenses: Not hard at all  Food Insecurity: No Food Insecurity (04/10/2021)   Hunger Vital Sign    Worried About Running Out of Food in the Last Year: Never true     Ran Out of Food in the Last Year: Never true  Transportation Needs: No Transportation Needs (04/10/2021)   PRAPARE - Administrator, Civil Service (Medical): No  Lack of Transportation (Non-Medical): No  Physical Activity: Inactive (04/10/2021)   Exercise Vital Sign    Days of Exercise per Week: 0 days    Minutes of Exercise per Session: 0 min  Stress: No Stress Concern Present (04/10/2021)   [employer] of Occupational Health - Occupational Stress Questionnaire    Feeling of Stress : Not at all  Social Connections: Not on file     Review of Systems: MSK: As noted per HPI above GI: No current Nausea/vomiting ENT: Denies sore throat, epistaxis CV: Denies chest pain Resp: No current shortness of breath  Other than mentioned above, there are no Constitutional, Neurological, Psychiatric, ENT, Ophthalmological, Cardiovascular, Respiratory, GI, GU, Musculoskeletal, Integumentary, Lymphatic, Endocrine or Allergic issues.   Physical Examination: CV: Normal distal pulses Lungs: Unlabored respirations RUE: Examination of the right hand demonstrates skin is intact and benign.  There is persistent tenderness over the ring and small finger A1 pulleys.  There is also new tenderness over the index and long finger A1 pulleys.  Otherwise normal MCP, PIP, and DIP motion and strength.  Sensation intact to light touch through all aspects of all digits.  Fingers are warm and well-perfused.  Assessment/Plan: RIGHT RING AND SMALL FINGER TRIGGER FINGER RELEASE  RIGHT INDEX AND LONG FINGER A1 PULLEY INJECTION   Ernestina Columbia M.D. Orthopaedic Surgery [facility]  Review of this patient's medications prescribed by other providers does not in any way constitute an endorsement by this clinician of their use, indications, dosage, route, efficacy, interactions, or other clinical parameters.  Portions of the record have been created with voice recognition  software.  Grammatical and punctuation errors, random word insertions, wrong-word or "sound-a-like" substitutions, pronoun errors (inaccuracies and/or substitutions), and/or incomplete sentences may have occurred due to the inherent limitations of voice recognition software.  Not all errors are caught or corrected.  Although every attempt is made to root out erroneous and incomplete transcription, the note may still not fully represent the intent or opinion of the author.  Read the chart carefully and recognize, using context, where errors/substitutions have occurred.  Any questions or concerns about the content of this note or information contained within the body of this dictation should be addressed directly with the author for clarification.

## 2021-09-22 ENCOUNTER — Encounter (HOSPITAL_COMMUNITY): Payer: Self-pay | Admitting: Orthopedic Surgery

## 2021-10-13 ENCOUNTER — Other Ambulatory Visit: Payer: Self-pay | Admitting: Medical

## 2021-10-14 ENCOUNTER — Ambulatory Visit
Admission: RE | Admit: 2021-10-14 | Discharge: 2021-10-14 | Disposition: A | Discharge status: Home / Self Care | Payer: Medicare HMO | Source: Ambulatory Visit | Attending: Medical | Admitting: Medical

## 2021-10-14 DIAGNOSIS — Z0000 Encounter for general adult medical examination without abnormal findings: Secondary | ICD-10-CM

## 2021-10-14 DIAGNOSIS — Z122 Encounter for screening for malignant neoplasm of respiratory organs: Secondary | ICD-10-CM

## 2021-10-14 DIAGNOSIS — F17200 Nicotine dependence, unspecified, uncomplicated: Secondary | ICD-10-CM

## 2021-10-14 DIAGNOSIS — F17210 Nicotine dependence, cigarettes, uncomplicated: Secondary | ICD-10-CM | POA: Diagnosis not present

## 2021-10-18 ENCOUNTER — Other Ambulatory Visit: Payer: Self-pay | Admitting: Medical

## 2021-10-19 NOTE — Telephone Encounter (Signed)
[facility] is requesting to fill pt prozac . Please advise. KH °

## 2021-10-28 ENCOUNTER — Telehealth: Payer: Self-pay | Admitting: Medical

## 2021-10-28 NOTE — Telephone Encounter (Signed)
Pt signed to have medical records from immunizations from [facility] on [address]. Pt signed to received  dates for Shingrix. We had not received so I called pharmacy today. Per pharmacy the have no records of pt receiving any vaccines.

## 2021-11-02 ENCOUNTER — Telehealth: Payer: Self-pay | Admitting: Neurology

## 2021-11-02 ENCOUNTER — Encounter: Payer: Self-pay | Admitting: Neurology

## 2021-11-02 ENCOUNTER — Telehealth (INDEPENDENT_AMBULATORY_CARE_PROVIDER_SITE_OTHER): Payer: Self-pay | Admitting: Neurology

## 2021-11-02 DIAGNOSIS — Z91199 Patient's noncompliance with other medical treatment and regimen due to unspecified reason: Secondary | ICD-10-CM

## 2021-11-02 NOTE — Telephone Encounter (Addendum)
error 

## 2021-11-02 NOTE — Telephone Encounter (Signed)
Patient no-showed her follow up today. Second no-show in a year. If she calls to reschedule please inform her 3 no shows is an office policy for dismissal thanks

## 2021-11-02 NOTE — Progress Notes (Signed)
Patient no-showed her follow up today. Second no-show in a year. If she calls to reschedule please inform her 3 no shows is an office policy for dismissal and she can reschedule with an NP thanks

## 2021-11-09 DIAGNOSIS — M5136 Other intervertebral disc degeneration, lumbar region: Secondary | ICD-10-CM | POA: Diagnosis not present

## 2021-11-09 DIAGNOSIS — M5451 Vertebrogenic low back pain: Secondary | ICD-10-CM | POA: Diagnosis not present

## 2021-11-09 DIAGNOSIS — M4326 Fusion of spine, lumbar region: Secondary | ICD-10-CM | POA: Diagnosis not present

## 2021-11-17 ENCOUNTER — Other Ambulatory Visit: Payer: Self-pay | Admitting: Family Medicine

## 2021-11-18 ENCOUNTER — Encounter: Payer: Self-pay | Admitting: Internal Medicine

## 2021-11-20 DIAGNOSIS — M48062 Spinal stenosis, lumbar region with neurogenic claudication: Secondary | ICD-10-CM | POA: Diagnosis not present

## 2021-11-23 ENCOUNTER — Other Ambulatory Visit: Payer: Self-pay | Admitting: Medical

## 2021-11-23 ENCOUNTER — Telehealth: Payer: Self-pay | Admitting: Medical

## 2021-11-23 MED ORDER — SOLIFENACIN SUCCINATE 5 MG PO TABS: ORAL_TABLET | ORAL | 2 refills | Status: DC

## 2021-11-23 MED ORDER — PREGABALIN 150 MG PO CAPS: ORAL_CAPSULE | ORAL | 3 refills | Status: DC

## 2021-11-23 NOTE — Telephone Encounter (Signed)
Pt called for refills on vesicare and lyrica. Please send to [facility] on [address]. Pt can be reached at [phone].

## 2021-12-07 ENCOUNTER — Other Ambulatory Visit: Payer: Self-pay | Admitting: Orthopedic Surgery

## 2021-12-09 ENCOUNTER — Ambulatory Visit (INDEPENDENT_AMBULATORY_CARE_PROVIDER_SITE_OTHER): Payer: Medicare HMO | Admitting: Medical

## 2021-12-09 VITALS — BP 120/70 | HR 65 | Wt 166.8 lb

## 2021-12-09 DIAGNOSIS — I2510 Atherosclerotic heart disease of native coronary artery without angina pectoris: Secondary | ICD-10-CM | POA: Diagnosis not present

## 2021-12-09 DIAGNOSIS — W19XXXA Unspecified fall, initial encounter: Secondary | ICD-10-CM

## 2021-12-09 DIAGNOSIS — G4700 Insomnia, unspecified: Secondary | ICD-10-CM | POA: Diagnosis not present

## 2021-12-09 DIAGNOSIS — M545 Low back pain, unspecified: Secondary | ICD-10-CM | POA: Diagnosis not present

## 2021-12-09 DIAGNOSIS — T148XXA Other injury of unspecified body region, initial encounter: Secondary | ICD-10-CM

## 2021-12-09 DIAGNOSIS — F17200 Nicotine dependence, unspecified, uncomplicated: Secondary | ICD-10-CM | POA: Diagnosis not present

## 2021-12-09 DIAGNOSIS — I700 Atherosclerosis of aorta: Secondary | ICD-10-CM | POA: Insufficient documentation

## 2021-12-09 DIAGNOSIS — Z23 Encounter for immunization: Secondary | ICD-10-CM

## 2021-12-09 MED ORDER — QUETIAPINE FUMARATE 25 MG PO TABS: 25.0000 mg | ORAL_TABLET | Freq: Every evening | ORAL | 0 refills | Status: DC | PRN

## 2021-12-09 MED ORDER — TRAZODONE HCL 50 MG PO TABS: 50.0000 mg | ORAL_TABLET | Freq: Every day | ORAL | 0 refills | Status: DC

## 2021-12-09 NOTE — Progress Notes (Signed)
Subjective:  Desiree Thomas is a 61 y.o. female who presents for Chief Complaint  Patient presents with   fell at home    [place] at home on Friday night,  back is bruised, needs refill on seroquel, trazodone     Here for fall, back pain. Date of injury 5 days ago, 12/04/21.  She tripped over a case of water in the floor at the home in bedroom.   Fell backwards onto her back.   Had some stuff in her arms, so didn't break her fall.   Lied there for a few minutes, then her husband got her up.  Went back to bed to rest.  Using some Aspercreme, ice.   Been sore and stiff.  Today feels some improved, but still has pain in low back.  No headache, no LOC. No new pain down legs, no new paresthesias.     No other injury.  Having back surgery in December 2023 by Dr. Yevette Edwards for back issues, lumbar spine.  Needs refills on trazodone and Seroquel for that she uses for sleep.  She takes trazodone daily.  Seroquel as a backup sleep aid  She would like a flu shot today  Here to review her recent CT chest scan  No other aggravating or relieving factors.    No other c/o.  Past Medical History:  Diagnosis Date   Allergy    Arthritis    Asthma    no inhaler - as a young adult, no current problems   Chronic back pain    Depression    GERD (gastroesophageal reflux disease)    no meds, diet controlled   Hearing loss    Hyperlipidemia    Hypertension    Migraine    Current Outpatient Medications on File Prior to Visit  Medication Sig Dispense Refill   amLODipine (NORVASC) 10 MG tablet Take 1 tablet (10 mg total) by mouth daily. 90 tablet 3   atorvastatin (LIPITOR) 40 MG tablet TAKE 1 TABLET(40 MG) BY MOUTH DAILY 90 tablet 1   esomeprazole (NEXIUM) 40 MG capsule Take 1 capsule (40 mg total) by mouth 2 (two) times daily before a meal. 90 capsule 0   FLUoxetine (PROZAC) 20 MG capsule TAKE 1 CAPSULE(20 MG) BY MOUTH DAILY 30 capsule 5   loratadine (CLARITIN) 10 MG tablet Take 10 mg by mouth daily.      magnesium gluconate (MAGONATE) 500 MG tablet Take 500 mg by mouth daily.     methocarbamol (ROBAXIN) 750 MG tablet Take 750 mg by mouth in the morning and at bedtime.     naproxen sodium (ALEVE) 220 MG tablet Take 440 mg by mouth daily as needed (pain).     ondansetron (ZOFRAN-ODT) 8 MG disintegrating tablet Take 8 mg by mouth every 8 (eight) hours as needed for nausea or vomiting.     oxyCODONE-acetaminophen (PERCOCET/ROXICET) 5-325 MG tablet Take 1 tablet by mouth 2 (two) times daily as needed for severe pain.     pregabalin (LYRICA) 150 MG capsule TAKE 1 CAPSULE BY MOUTH EVERY MORNING, AFTERNOON AND EVENING. 90 capsule 3   promethazine (PHENERGAN) 12.5 MG tablet Take 1 tablet (12.5 mg total) by mouth every 6 (six) hours as needed for nausea or vomiting. 30 tablet 0   propranolol ER (INDERAL LA) 60 MG 24 hr capsule TAKE 1 CAPSULE(60 MG) BY MOUTH AT BEDTIME 90 capsule 0   rizatriptan (MAXALT) 5 MG tablet Take 5 mg by mouth as needed for migraine. May repeat in 2  hours if needed     solifenacin (VESICARE) 5 MG tablet TAKE 1 TABLET(5 MG) BY MOUTH DAILY 90 tablet 2   trolamine salicylate (ASPERCREME) 10 % cream Apply 1 Application topically as needed for muscle pain.     Turmeric Curcumin 500 MG CAPS Take 500 mg by mouth daily.     Vitamin D, Ergocalciferol, (DRISDOL) 1.25 MG (50000 UNIT) CAPS capsule TAKE 1 CAPSULE BY MOUTH EVERY 7 DAYS (Patient taking differently: Take 50,000 Units by mouth every Saturday.) 12 capsule 1   docusate sodium (PHILLIPS STOOL SOFTENER) 100 MG capsule Take 100 mg by mouth daily.     No current facility-administered medications on file prior to visit.   Past Surgical History:  Procedure Laterality Date   ABDOMINAL HYSTERECTOMY     still has ovaries   APPENDECTOMY     COLONOSCOPY  2019   KNEE SURGERY  02/2013   KNEE SURGERY Bilateral    arthroscopy   LUMBAR FUSION  02/2014   LUMBAR SPINE SURGERY     fusion   TRIGGER FINGER RELEASE Right 09/21/2021   Procedure:  RIGHT RING AND SMALL TRIGGER FINGER RELEASE and INJECTIONS OF RIGHT INDEX AND LONG FINGER;  Surgeon: Ernestina Columbia, MD;  Location: [facility] OR;  Service: Orthopedics;  Laterality: Right;     The following portions of the patient's history were reviewed and updated as appropriate: allergies, current medications, past family history, past medical history, past social history, past surgical history and problem list.  ROS Otherwise as in subjective above  Objective: BP 120/70   Pulse 65   Wt 166 lb 12.8 oz (75.7 kg)   BMI 30.51 kg/m   Wt Readings from Last 3 Encounters:  12/09/21 166 lb 12.8 oz (75.7 kg)  09/21/21 160 lb (72.6 kg)  09/07/21 160 lb 3.2 oz (72.7 kg)    General appearance: alert, no distress, well developed, well nourished Right low back with a round area of fading purplish bruising approximately 20 cm diameter but no induration or hematoma, tenderness in general in the lumbar spine on the right but not midline, relatively normal range of motion but with some pain Legs normal range of motion without deformity, nontender no swelling Legs neurovascularly intact Pulses: 2+ radial pulses, 2+ pedal pulses, normal cap refill Ext: no edema   Assessment: Encounter Diagnoses  Name Primary?   Fall, initial encounter Yes   Acute right-sided low back pain without sciatica    Needs flu shot    Bruise of muscle    Smoker    Coronary artery disease involving native coronary artery of native heart without angina pectoris    Aortic atherosclerosis (HCC)    Insomnia, unspecified type      Plan: Bruise on back, fall, acute on chronic back pain-she will use gentle stretching, alternating heat and ice therapy the next few days.  She already has chronic pain medicine she uses through orthopedics.  She will go for x-ray.  She has an upcoming back surgery in December, so lets rule out any new bony injury..  If worse or new symptoms in the next few days call back.  Assuming no new  compression fracture or other symptoms should resolve over the next week  We discussed her recent CT chest from 10/2021.  No nodularity.  There was some mild CAD and aortic atherosclerosis.  We discussed the findings.  Continue blood pressure and cholesterol medicine, continues to stop smoking completely.  Plan for repeat chest CT in 1 year for  lung cancer screening.  Counseled on the influenza virus vaccine.  Vaccine information sheet given.  Influenza vaccine given after consent obtained.  Alaiyah was seen today for fell at home.  Diagnoses and all orders for this visit:  Fall, initial encounter -     DG Lumbar Spine Complete; Future  Acute right-sided low back pain without sciatica -     DG Lumbar Spine Complete; Future  Needs flu shot -     Flu Vaccine QUAD 11mo+IM (Fluarix, Fluzone & Alfiuria Quad PF)  Bruise of muscle -     DG Lumbar Spine Complete; Future  Smoker  Coronary artery disease involving native coronary artery of native heart without angina pectoris  Aortic atherosclerosis (HCC)  Insomnia, unspecified type  Other orders -     traZODone (DESYREL) 50 MG tablet; Take 1 tablet (50 mg total) by mouth at bedtime. -     QUEtiapine (SEROQUEL) 25 MG tablet; Take 1 tablet (25 mg total) by mouth at bedtime as needed (sleep).    Follow up: Pending x-ray

## 2021-12-09 NOTE — Patient Instructions (Signed)
Please go to [facility] for your low back xray.   Their hours are 8am - 4:30 pm Monday - Friday.  Take your insurance card with you.  [facility] [phone]  [facility], [place], [place] [address]  [address] [place],  [address]

## 2021-12-21 ENCOUNTER — Ambulatory Visit
Admission: RE | Admit: 2021-12-21 | Discharge: 2021-12-21 | Disposition: A | Discharge status: Home / Self Care | Payer: Medicare HMO | Source: Ambulatory Visit | Attending: Medical | Admitting: Medical

## 2021-12-21 DIAGNOSIS — M545 Low back pain, unspecified: Secondary | ICD-10-CM

## 2021-12-21 DIAGNOSIS — T148XXA Other injury of unspecified body region, initial encounter: Secondary | ICD-10-CM

## 2021-12-21 DIAGNOSIS — W19XXXA Unspecified fall, initial encounter: Secondary | ICD-10-CM

## 2021-12-22 ENCOUNTER — Encounter: Payer: Self-pay | Admitting: Internal Medicine

## 2021-12-22 ENCOUNTER — Other Ambulatory Visit: Payer: Self-pay | Admitting: Medical

## 2021-12-22 DIAGNOSIS — M48062 Spinal stenosis, lumbar region with neurogenic claudication: Secondary | ICD-10-CM | POA: Diagnosis not present

## 2021-12-22 DIAGNOSIS — Z1231 Encounter for screening mammogram for malignant neoplasm of breast: Secondary | ICD-10-CM

## 2022-01-08 ENCOUNTER — Other Ambulatory Visit (INDEPENDENT_AMBULATORY_CARE_PROVIDER_SITE_OTHER): Payer: Medicare HMO

## 2022-01-08 DIAGNOSIS — Z23 Encounter for immunization: Secondary | ICD-10-CM

## 2022-01-08 DIAGNOSIS — M48062 Spinal stenosis, lumbar region with neurogenic claudication: Secondary | ICD-10-CM | POA: Diagnosis not present

## 2022-01-21 ENCOUNTER — Ambulatory Visit
Admission: RE | Admit: 2022-01-21 | Discharge: 2022-01-21 | Disposition: A | Discharge status: Home / Self Care | Payer: Medicare HMO | Source: Ambulatory Visit | Attending: Medical | Admitting: Medical

## 2022-01-21 DIAGNOSIS — Z1231 Encounter for screening mammogram for malignant neoplasm of breast: Secondary | ICD-10-CM

## 2022-01-27 ENCOUNTER — Other Ambulatory Visit: Payer: Self-pay | Admitting: Medical

## 2022-01-27 MED ORDER — PROPRANOLOL HCL ER 60 MG PO CP24: 60.0000 mg | ORAL_CAPSULE | Freq: Every day | ORAL | 1 refills | Status: DC

## 2022-01-27 MED ORDER — PROPRANOLOL HCL ER 60 MG PO CP24
60.0000 mg | ORAL_CAPSULE | Freq: Every day | ORAL | 1 refills | Status: DC
Start: 2022-01-27 — End: 2023-02-07

## 2022-01-27 NOTE — Telephone Encounter (Signed)
From: Guss Bunde To: Office of Kristian Covey, [place] Sent: 01/27/2022 2:29 PM EST Subject: Medication Renewal Request  Refills have been requested for the following medications:   propranolol ER (INDERAL LA) 60 MG 24 hr capsule [Shane Tysinger]  Preferred pharmacy: [id number] - [place], [place] - [address] [facility] [address] Delivery method: [employer]

## 2022-02-05 NOTE — Progress Notes (Signed)
Surgical Instructions    Your procedure is scheduled on Thursday December 7th.  Report to [facility] Main Entrance "A" at 5:30 A.M., then check in with the Admitting office.  Call this number if you have problems the morning of surgery:  [phone]   If you have any questions prior to your surgery date call [phone]: Open Monday-Friday 8am-4pm If you experience any cold or flu symptoms such as cough, fever, chills, shortness of breath, etc. between now and your scheduled surgery, please notify us at the above number     Remember:  Do not eat after midnight the night before your surgery  You may drink clear liquids until 4:30am the morning of your surgery.   Clear liquids allowed are: Water, Non-Citrus Juices (without pulp), Carbonated Beverages, Clear Tea, Black Coffee ONLY (NO MILK, CREAM OR POWDERED CREAMER of any kind), and Gatorade   Enhanced Recovery after Surgery for Orthopedics Enhanced Recovery after Surgery is a protocol used to improve the stress on your body and your recovery after surgery.  Patient Instructions  The day of surgery (if you do NOT have diabetes):  Drink ONE (1) Pre-Surgery Clear Ensure by __4:30___ am the morning of surgery   This drink was given to you during your hospital  pre-op appointment visit. Nothing else to drink after completing the  Pre-Surgery Clear Ensure.          If you have questions, please contact your surgeon's office.     Take these medicines the morning of surgery with A SIP OF WATER: amLODipine (NORVASC) 10 MG tablet  atorvastatin (LIPITOR) 40 MG tablet  FLUoxetine (PROZAC) 20 MG capsule  loratadine (CLARITIN) 10 MG tablet  pregabalin (LYRICA) 150 MG capsule  solifenacin (VESICARE) 5 MG tablet   IF NEEDED  diphenhydrAMINE (BENADRYL) 25 MG tablet  methocarbamol (ROBAXIN) 750 MG tablet  ondansetron (ZOFRAN-ODT) 8 MG disintegrating tablet  oxyCODONE-acetaminophen (PERCOCET/ROXICET) 5-325 MG tablet  rizatriptan  (MAXALT) 5 MG tablet    As of today, STOP taking any Aspirin (unless otherwise instructed by your surgeon) Aleve, Naproxen, Ibuprofen, Motrin, Advil, Goody's, BC's, all herbal medications, fish oil, and all vitamins.           Do not wear jewelry or makeup. Do not wear lotions, powders, perfumes or deodorant. Do not shave 48 hours prior to surgery.  Do not bring valuables to the hospital. Do not wear nail polish, gel polish, artificial nails, or any other type of covering on natural nails (fingers and toes) If you have artificial nails or gel coating that need to be removed by a nail salon, please have this removed prior to surgery. Artificial nails or gel coating may interfere with anesthesia's ability to adequately monitor your vital signs.  [facility] is not responsible for any belongings or valuables.    Do NOT Smoke (Tobacco/Vaping)  24 hours prior to your procedure  If you use a CPAP at night, you may bring your mask for your overnight stay.   Contacts, glasses, hearing aids, dentures or partials may not be worn into surgery, please bring cases for these belongings   For patients admitted to the hospital, discharge time will be determined by your treatment team.   Patients discharged the day of surgery will not be allowed to drive home, and someone needs to stay with them for 24 hours.   SURGICAL WAITING ROOM VISITATION Patients having surgery or a procedure may have no more than 2 support people in the waiting area - these  visitors may rotate.   Children under the age of 15 must have an adult with them who is not the patient. If the patient needs to stay at the hospital during part of their recovery, the visitor guidelines for inpatient rooms apply. Pre-op nurse will coordinate an appropriate time for 1 support person to accompany patient in pre-op.  This support person may not rotate.   Please refer to [email or web address] for  the visitor guidelines for Inpatients (after your surgery is over and you are in a regular room).    Special instructions:    Oral Hygiene is also important to reduce your risk of infection.  Remember - BRUSH YOUR TEETH THE MORNING OF SURGERY WITH YOUR REGULAR TOOTHPASTE   [facility]- Preparing For Surgery  Before surgery, you can play an important role. Because skin is not sterile, your skin needs to be as free of germs as possible. You can reduce the number of germs on your skin by washing with CHG (chlorahexidine gluconate) Soap before surgery.  CHG is an antiseptic cleaner which kills germs and bonds with the skin to continue killing germs even after washing.     Please do not use if you have an allergy to CHG or antibacterial soaps. If your skin becomes reddened/irritated stop using the CHG.  Do not shave (including legs and underarms) for at least 48 hours prior to first CHG shower. It is OK to shave your face.  Please follow these instructions carefully.     Shower the NIGHT BEFORE SURGERY and the MORNING OF SURGERY with CHG Soap.   If you chose to wash your hair, wash your hair first as usual with your normal shampoo. After you shampoo, rinse your hair and body thoroughly to remove the shampoo.  Then [employer] and genitals (private parts) with your normal soap and rinse thoroughly to remove soap.  After that Use CHG Soap as you would any other liquid soap. You can apply CHG directly to the skin and wash gently with a scrungie or a clean washcloth.   Apply the CHG Soap to your body ONLY FROM THE NECK DOWN.  Do not use on open wounds or open sores. Avoid contact with your eyes, ears, mouth and genitals (private parts). Wash Face and genitals (private parts)  with your normal soap.   Wash thoroughly, paying special attention to the area where your surgery will be performed.  Thoroughly rinse your body with warm water from the neck down.  DO NOT shower/wash with your normal soap  after using and rinsing off the CHG Soap.  Pat yourself dry with a CLEAN TOWEL.  Wear CLEAN PAJAMAS to bed the night before surgery  Place CLEAN SHEETS on your bed the night before your surgery  DO NOT SLEEP WITH PETS.   Day of Surgery:  Take a shower with CHG soap. Wear Clean/Comfortable clothing the morning of surgery Do not apply any deodorants/lotions.   Remember to brush your teeth WITH YOUR REGULAR TOOTHPASTE.    If you received a COVID test during your pre-op visit, it is requested that you wear a mask when out in public, stay away from anyone that may not be feeling well, and notify your surgeon if you develop symptoms. If you have been in contact with anyone that has tested positive in the last 10 days, please notify your surgeon.    Please read over the following fact sheets that you were given.

## 2022-02-08 ENCOUNTER — Other Ambulatory Visit: Payer: Self-pay

## 2022-02-08 ENCOUNTER — Encounter (HOSPITAL_COMMUNITY): Payer: Self-pay

## 2022-02-08 ENCOUNTER — Encounter (HOSPITAL_COMMUNITY)
Admission: RE | Admit: 2022-02-08 | Discharge: 2022-02-08 | Disposition: A | Discharge status: Home / Self Care | Payer: Medicare HMO | Source: Ambulatory Visit | Attending: Orthopedic Surgery | Admitting: Orthopedic Surgery

## 2022-02-08 VITALS — BP 148/92 | HR 62 | Temp 97.4°F | Resp 18 | Ht 62.0 in | Wt 164.8 lb

## 2022-02-08 DIAGNOSIS — I10 Essential (primary) hypertension: Secondary | ICD-10-CM | POA: Insufficient documentation

## 2022-02-08 DIAGNOSIS — Z01812 Encounter for preprocedural laboratory examination: Secondary | ICD-10-CM | POA: Diagnosis not present

## 2022-02-08 DIAGNOSIS — Z01818 Encounter for other preprocedural examination: Secondary | ICD-10-CM

## 2022-02-08 HISTORY — DX: Personal history of infections of the central nervous system: Z86.61

## 2022-02-08 LAB — BASIC METABOLIC PANEL
Anion gap: 8 (ref 5–15)
BUN: 8 mg/dL (ref 6–20)
CO2: 27 mmol/L (ref 22–32)
Calcium: 9.2 mg/dL (ref 8.9–10.3)
Chloride: 102 mmol/L (ref 98–111)
Creatinine, Ser: 0.8 mg/dL (ref 0.44–1.00)
GFR, Estimated: >60 mL/min (ref >60)
Glucose, Bld: 83 mg/dL (ref 70–99)
Potassium: 4.1 mmol/L (ref 3.5–5.1)
Sodium: 137 mmol/L (ref 135–145)

## 2022-02-08 LAB — SURGICAL PCR SCREEN
MRSA, PCR: NEGATIVE
Staphylococcus aureus: NEGATIVE

## 2022-02-08 LAB — CBC
HCT: 36.2 % (ref 36.0–46.0)
Hemoglobin: 12.6 g/dL (ref 12.0–15.0)
MCH: 28.6 pg (ref 26.0–34.0)
MCHC: 34.8 g/dL (ref 30.0–36.0)
MCV: 82.3 fL (ref 80.0–100.0)
Platelets: 329 10*3/uL (ref 150–400)
RBC: 4.4 MIL/uL (ref 3.87–5.11)
RDW: 14.8 % (ref 11.5–15.5)
WBC: 7.2 10*3/uL (ref 4.0–10.5)
nRBC: 0 % (ref 0.0–0.2)

## 2022-02-08 LAB — TYPE AND SCREEN
ABO/RH(D): O POS
Antibody Screen: NEGATIVE

## 2022-02-08 NOTE — Progress Notes (Signed)
PCP - Crosby Oyster Cardiologist - denies  PPM/ICD - denies   Chest x-ray- N/A EKG - 09/07/21 Stress Test - denies ECHO - denies Cardiac Cath - denies  Sleep Study - denies  Blood Thinner Instructions: N/A Aspirin Instructions:N/A  ERAS Protcol -ERAS + ensure per order  COVID TEST- N/A   Anesthesia review: no  Patient denies shortness of breath, fever, cough and chest pain at PAT appointment   All instructions explained to the patient, with a verbal understanding of the material. Patient agrees to go over the instructions while at home for a better understanding. Patient also instructed to self quarantine after being tested for COVID-19. The opportunity to ask questions was provided.

## 2022-02-17 NOTE — Anesthesia Preprocedure Evaluation (Addendum)
Anesthesia Evaluation  Patient identified by MRN, date of birth, ID band Patient awake    Reviewed: Allergy & Precautions, NPO status , Patient's Chart, lab work & pertinent test results  History of Anesthesia Complications Negative for: history of anesthetic complications  Airway Mallampati: II  TM Distance: >3 FB Neck ROM: Full    Dental no notable dental hx.    Pulmonary Current Smoker and Patient abstained from smoking.   Pulmonary exam normal        Cardiovascular hypertension, Pt. on medications + CAD  Normal cardiovascular exam     Neuro/Psych  Headaches   Depression    Lumbar stenosis     GI/Hepatic Neg liver ROS,GERD  ,,  Endo/Other  negative endocrine ROS    Renal/GU negative Renal ROS  negative genitourinary   Musculoskeletal negative musculoskeletal ROS (+)    Abdominal   Peds  Hematology negative hematology ROS (+)   Anesthesia Other Findings Day of surgery medications reviewed with patient.  Reproductive/Obstetrics negative OB ROS                              Anesthesia Physical Anesthesia Plan  ASA: 2  Anesthesia Plan: General   Post-op Pain Management: Tylenol PO (pre-op)*, Ketamine IV*, Precedex and Dilaudid IV   Induction: Intravenous  PONV Risk Score and Plan: 2 and Treatment may vary due to age or medical condition, Dexamethasone, Ondansetron and Midazolam  Airway Management Planned: Oral ETT  Additional Equipment: None  Intra-op Plan:   Post-operative Plan: Extubation in OR  Informed Consent: I have reviewed the patients History and Physical, chart, labs and discussed the procedure including the risks, benefits and alternatives for the proposed anesthesia with the patient or authorized representative who has indicated his/her understanding and acceptance.     Dental advisory given  Plan Discussed with: CRNA  Anesthesia Plan Comments:          Anesthesia Quick Evaluation

## 2022-02-18 ENCOUNTER — Inpatient Hospital Stay (HOSPITAL_COMMUNITY): Payer: Medicare HMO | Admitting: Certified Registered"

## 2022-02-18 ENCOUNTER — Other Ambulatory Visit: Payer: Self-pay

## 2022-02-18 ENCOUNTER — Inpatient Hospital Stay (HOSPITAL_COMMUNITY): Payer: Medicare HMO

## 2022-02-18 ENCOUNTER — Inpatient Hospital Stay (HOSPITAL_COMMUNITY)
Admission: RE | Admit: 2022-02-18 | Discharge: 2022-02-19 | DRG: 455 | Disposition: A | Discharge status: Home / Self Care | Payer: Medicare HMO | Attending: Orthopedic Surgery | Admitting: Orthopedic Surgery

## 2022-02-18 ENCOUNTER — Encounter (HOSPITAL_COMMUNITY): Payer: Self-pay | Admitting: Orthopedic Surgery

## 2022-02-18 ENCOUNTER — Encounter (HOSPITAL_COMMUNITY)
Admission: RE | Disposition: A | Discharge status: Home / Self Care | Payer: Self-pay | Source: Home / Self Care | Attending: Orthopedic Surgery

## 2022-02-18 DIAGNOSIS — Z8261 Family history of arthritis: Secondary | ICD-10-CM

## 2022-02-18 DIAGNOSIS — M4316 Spondylolisthesis, lumbar region: Secondary | ICD-10-CM | POA: Diagnosis present

## 2022-02-18 DIAGNOSIS — M48061 Spinal stenosis, lumbar region without neurogenic claudication: Secondary | ICD-10-CM

## 2022-02-18 DIAGNOSIS — I2510 Atherosclerotic heart disease of native coronary artery without angina pectoris: Secondary | ICD-10-CM | POA: Diagnosis present

## 2022-02-18 DIAGNOSIS — Z833 Family history of diabetes mellitus: Secondary | ICD-10-CM

## 2022-02-18 DIAGNOSIS — Z800 Family history of malignant neoplasm of digestive organs: Secondary | ICD-10-CM | POA: Diagnosis not present

## 2022-02-18 DIAGNOSIS — F32A Depression, unspecified: Secondary | ICD-10-CM | POA: Diagnosis present

## 2022-02-18 DIAGNOSIS — M48062 Spinal stenosis, lumbar region with neurogenic claudication: Secondary | ICD-10-CM | POA: Diagnosis not present

## 2022-02-18 DIAGNOSIS — Z8249 Family history of ischemic heart disease and other diseases of the circulatory system: Secondary | ICD-10-CM

## 2022-02-18 DIAGNOSIS — M4726 Other spondylosis with radiculopathy, lumbar region: Secondary | ICD-10-CM | POA: Diagnosis not present

## 2022-02-18 DIAGNOSIS — M5416 Radiculopathy, lumbar region: Secondary | ICD-10-CM | POA: Diagnosis not present

## 2022-02-18 DIAGNOSIS — Z90710 Acquired absence of both cervix and uterus: Secondary | ICD-10-CM | POA: Diagnosis not present

## 2022-02-18 DIAGNOSIS — Z981 Arthrodesis status: Secondary | ICD-10-CM

## 2022-02-18 DIAGNOSIS — I10 Essential (primary) hypertension: Secondary | ICD-10-CM | POA: Diagnosis not present

## 2022-02-18 DIAGNOSIS — K219 Gastro-esophageal reflux disease without esophagitis: Secondary | ICD-10-CM | POA: Diagnosis present

## 2022-02-18 DIAGNOSIS — M4326 Fusion of spine, lumbar region: Secondary | ICD-10-CM | POA: Diagnosis not present

## 2022-02-18 DIAGNOSIS — H9191 Unspecified hearing loss, right ear: Secondary | ICD-10-CM | POA: Diagnosis not present

## 2022-02-18 DIAGNOSIS — Z825 Family history of asthma and other chronic lower respiratory diseases: Secondary | ICD-10-CM | POA: Diagnosis not present

## 2022-02-18 DIAGNOSIS — E785 Hyperlipidemia, unspecified: Secondary | ICD-10-CM | POA: Diagnosis present

## 2022-02-18 DIAGNOSIS — F17210 Nicotine dependence, cigarettes, uncomplicated: Secondary | ICD-10-CM | POA: Diagnosis present

## 2022-02-18 DIAGNOSIS — M5116 Intervertebral disc disorders with radiculopathy, lumbar region: Principal | ICD-10-CM | POA: Diagnosis present

## 2022-02-18 HISTORY — PX: ANTERIOR LAT LUMBAR FUSION: SHX1168

## 2022-02-18 LAB — ABO/RH: ABO/RH(D): O POS

## 2022-02-18 SURGERY — ANTERIOR LATERAL LUMBAR FUSION 1 LEVEL
Anesthesia: General | Site: Spine Lumbar

## 2022-02-18 MED ORDER — LACTATED RINGERS IV SOLN: INTRAVENOUS | Status: DC

## 2022-02-18 MED ORDER — FLUOXETINE HCL 20 MG PO CAPS
20.0000 mg | ORAL_CAPSULE | Freq: Every day | ORAL | Status: DC
  Administered 2022-02-18 – 2022-02-19 (×2): 20 mg via ORAL
  Filled 2022-02-18 (×2): qty 1

## 2022-02-18 MED ORDER — HYDROMORPHONE HCL 1 MG/ML IJ SOLN
0.2500 mg | INTRAMUSCULAR | Status: DC | PRN
  Administered 2022-02-18: 0.5 mg via INTRAVENOUS

## 2022-02-18 MED ORDER — PROPOFOL 10 MG/ML IV BOLUS
INTRAVENOUS | Status: AC
  Filled 2022-02-18: qty 20

## 2022-02-18 MED ORDER — HYDROMORPHONE HCL 1 MG/ML IJ SOLN
INTRAMUSCULAR | Status: AC
  Filled 2022-02-18: qty 2

## 2022-02-18 MED ORDER — MIDAZOLAM HCL 2 MG/2ML IJ SOLN
INTRAMUSCULAR | Status: AC
  Filled 2022-02-18: qty 2

## 2022-02-18 MED ORDER — ZOLPIDEM TARTRATE 5 MG PO TABS: 5.0000 mg | ORAL_TABLET | Freq: Every evening | ORAL | Status: DC | PRN

## 2022-02-18 MED ORDER — BUPIVACAINE LIPOSOME 1.3 % IJ SUSP
INTRAMUSCULAR | Status: DC | PRN
  Administered 2022-02-18: 20 mL

## 2022-02-18 MED ORDER — SUMATRIPTAN SUCCINATE 50 MG PO TABS: 50.0000 mg | ORAL_TABLET | ORAL | Status: DC | PRN

## 2022-02-18 MED ORDER — METHYLENE BLUE 1 % INJ SOLN
INTRAVENOUS | Status: AC
  Filled 2022-02-18: qty 10

## 2022-02-18 MED ORDER — SUGAMMADEX SODIUM 200 MG/2ML IV SOLN
INTRAVENOUS | Status: DC | PRN
  Administered 2022-02-18: 200 mg via INTRAVENOUS

## 2022-02-18 MED ORDER — THROMBIN 20000 UNITS EX SOLR: CUTANEOUS | Status: DC | PRN

## 2022-02-18 MED ORDER — BUPIVACAINE LIPOSOME 1.3 % IJ SUSP
INTRAMUSCULAR | Status: AC
  Filled 2022-02-18: qty 20

## 2022-02-18 MED ORDER — KETAMINE HCL 50 MG/5ML IJ SOSY
PREFILLED_SYRINGE | INTRAMUSCULAR | Status: AC
  Filled 2022-02-18: qty 10

## 2022-02-18 MED ORDER — FENTANYL CITRATE (PF) 250 MCG/5ML IJ SOLN
INTRAMUSCULAR | Status: DC | PRN
  Administered 2022-02-18 (×3): 50 ug via INTRAVENOUS

## 2022-02-18 MED ORDER — SENNOSIDES-DOCUSATE SODIUM 8.6-50 MG PO TABS: 1.0000 | ORAL_TABLET | Freq: Every evening | ORAL | Status: DC | PRN

## 2022-02-18 MED ORDER — ONDANSETRON HCL 4 MG PO TABS: 4.0000 mg | ORAL_TABLET | Freq: Four times a day (QID) | ORAL | Status: DC | PRN

## 2022-02-18 MED ORDER — PHENYLEPHRINE HCL-NACL 20-0.9 MG/250ML-% IV SOLN
INTRAVENOUS | Status: AC
  Filled 2022-02-18: qty 250

## 2022-02-18 MED ORDER — POTASSIUM CHLORIDE IN NACL 20-0.9 MEQ/L-% IV SOLN: INTRAVENOUS | Status: DC

## 2022-02-18 MED ORDER — ACETAMINOPHEN 650 MG RE SUPP: 650.0000 mg | RECTAL | Status: DC | PRN

## 2022-02-18 MED ORDER — BUPIVACAINE-EPINEPHRINE (PF) 0.25% -1:200000 IJ SOLN
INTRAMUSCULAR | Status: AC
  Filled 2022-02-18: qty 30

## 2022-02-18 MED ORDER — SUCCINYLCHOLINE CHLORIDE 200 MG/10ML IV SOSY
PREFILLED_SYRINGE | INTRAVENOUS | Status: DC | PRN
  Administered 2022-02-18: 120 mg via INTRAVENOUS

## 2022-02-18 MED ORDER — BISACODYL 5 MG PO TBEC: 5.0000 mg | DELAYED_RELEASE_TABLET | Freq: Every day | ORAL | Status: DC | PRN

## 2022-02-18 MED ORDER — ORAL CARE MOUTH RINSE: 15.0000 mL | Freq: Once | OROMUCOSAL | Status: AC

## 2022-02-18 MED ORDER — HYDROMORPHONE HCL 1 MG/ML IJ SOLN
INTRAMUSCULAR | Status: DC | PRN
  Administered 2022-02-18 (×2): .5 mg via INTRAVENOUS

## 2022-02-18 MED ORDER — THROMBIN 20000 UNITS EX SOLR
CUTANEOUS | Status: AC
  Filled 2022-02-18: qty 20000

## 2022-02-18 MED ORDER — SODIUM CHLORIDE 0.9% FLUSH: 3.0000 mL | Freq: Two times a day (BID) | INTRAVENOUS | Status: DC

## 2022-02-18 MED ORDER — ALUM & MAG HYDROXIDE-SIMETH 200-200-20 MG/5ML PO SUSP: 30.0000 mL | Freq: Four times a day (QID) | ORAL | Status: DC | PRN

## 2022-02-18 MED ORDER — TRAZODONE HCL 50 MG PO TABS
50.0000 mg | ORAL_TABLET | Freq: Every day | ORAL | Status: DC
  Administered 2022-02-18: 50 mg via ORAL
  Filled 2022-02-18: qty 1

## 2022-02-18 MED ORDER — DOCUSATE SODIUM 100 MG PO CAPS
100.0000 mg | ORAL_CAPSULE | Freq: Two times a day (BID) | ORAL | Status: DC
  Administered 2022-02-18 – 2022-02-19 (×3): 100 mg via ORAL
  Filled 2022-02-18 (×3): qty 1

## 2022-02-18 MED ORDER — FLEET ENEMA 7-19 GM/118ML RE ENEM: 1.0000 | ENEMA | Freq: Once | RECTAL | Status: DC | PRN

## 2022-02-18 MED ORDER — PROPOFOL 500 MG/50ML IV EMUL
INTRAVENOUS | Status: DC | PRN
  Administered 2022-02-18: 125 ug/kg/min via INTRAVENOUS

## 2022-02-18 MED ORDER — ROCURONIUM BROMIDE 100 MG/10ML IV SOLN
INTRAVENOUS | Status: DC | PRN
  Administered 2022-02-18: 50 mg via INTRAVENOUS

## 2022-02-18 MED ORDER — ONDANSETRON HCL 4 MG/2ML IJ SOLN: 4.0000 mg | Freq: Four times a day (QID) | INTRAMUSCULAR | Status: DC | PRN

## 2022-02-18 MED ORDER — VITAMIN D (ERGOCALCIFEROL) 1.25 MG (50000 UNIT) PO CAPS: 50000.0000 [IU] | ORAL_CAPSULE | ORAL | Status: DC

## 2022-02-18 MED ORDER — SODIUM CHLORIDE 0.9 % IV SOLN
250.0000 mL | INTRAVENOUS | Status: DC
  Administered 2022-02-18: 250 mL via INTRAVENOUS

## 2022-02-18 MED ORDER — CHLORHEXIDINE GLUCONATE 0.12 % MT SOLN
15.0000 mL | Freq: Once | OROMUCOSAL | Status: AC
  Administered 2022-02-18: 15 mL via OROMUCOSAL
  Filled 2022-02-18: qty 15

## 2022-02-18 MED ORDER — FESOTERODINE FUMARATE ER 4 MG PO TB24
4.0000 mg | ORAL_TABLET | Freq: Every day | ORAL | Status: DC
  Administered 2022-02-19: 4 mg via ORAL
  Filled 2022-02-18 (×2): qty 1

## 2022-02-18 MED ORDER — DROPERIDOL 2.5 MG/ML IJ SOLN: 0.6250 mg | Freq: Once | INTRAMUSCULAR | Status: DC | PRN

## 2022-02-18 MED ORDER — ATORVASTATIN CALCIUM 40 MG PO TABS
40.0000 mg | ORAL_TABLET | Freq: Every day | ORAL | Status: DC
  Administered 2022-02-18 – 2022-02-19 (×2): 40 mg via ORAL
  Filled 2022-02-18 (×2): qty 1

## 2022-02-18 MED ORDER — HYDROMORPHONE HCL 1 MG/ML IJ SOLN
INTRAMUSCULAR | Status: AC
  Filled 2022-02-18: qty 0.5

## 2022-02-18 MED ORDER — MAGNESIUM GLUCONATE 500 MG PO TABS
500.0000 mg | ORAL_TABLET | Freq: Every day | ORAL | Status: DC
  Administered 2022-02-18 – 2022-02-19 (×2): 500 mg via ORAL
  Filled 2022-02-18 (×2): qty 1

## 2022-02-18 MED ORDER — ONDANSETRON 4 MG PO TBDP: 8.0000 mg | ORAL_TABLET | Freq: Three times a day (TID) | ORAL | Status: DC | PRN

## 2022-02-18 MED ORDER — DEXAMETHASONE SODIUM PHOSPHATE 10 MG/ML IJ SOLN
INTRAMUSCULAR | Status: DC | PRN
  Administered 2022-02-18: 10 mg via INTRAVENOUS

## 2022-02-18 MED ORDER — HYDROCODONE-ACETAMINOPHEN 5-325 MG PO TABS: 1.0000 | ORAL_TABLET | ORAL | Status: DC | PRN

## 2022-02-18 MED ORDER — DIPHENHYDRAMINE HCL 25 MG PO CAPS
50.0000 mg | ORAL_CAPSULE | Freq: Four times a day (QID) | ORAL | Status: DC | PRN
  Administered 2022-02-18 – 2022-02-19 (×4): 50 mg via ORAL
  Filled 2022-02-18 (×4): qty 2

## 2022-02-18 MED ORDER — DOCUSATE SODIUM 100 MG PO CAPS: 100.0000 mg | ORAL_CAPSULE | Freq: Every day | ORAL | Status: DC

## 2022-02-18 MED ORDER — VANCOMYCIN HCL 1000 MG IV SOLR
INTRAVENOUS | Status: DC | PRN
  Administered 2022-02-18: 1000 mg via INTRAVENOUS

## 2022-02-18 MED ORDER — SODIUM CHLORIDE 0.9% FLUSH: 3.0000 mL | INTRAVENOUS | Status: DC | PRN

## 2022-02-18 MED ORDER — ACETAMINOPHEN 500 MG PO TABS
1000.0000 mg | ORAL_TABLET | Freq: Once | ORAL | Status: AC
  Administered 2022-02-18: 1000 mg via ORAL
  Filled 2022-02-18: qty 2

## 2022-02-18 MED ORDER — PHENOL 1.4 % MT LIQD: 1.0000 | OROMUCOSAL | Status: DC | PRN

## 2022-02-18 MED ORDER — KETAMINE HCL 10 MG/ML IJ SOLN
INTRAMUSCULAR | Status: DC | PRN
  Administered 2022-02-18: 10 mg via INTRAVENOUS
  Administered 2022-02-18: 30 mg via INTRAVENOUS
  Administered 2022-02-18: 10 mg via INTRAVENOUS

## 2022-02-18 MED ORDER — PROPRANOLOL HCL ER 60 MG PO CP24
60.0000 mg | ORAL_CAPSULE | Freq: Every day | ORAL | Status: DC
  Administered 2022-02-18: 60 mg via ORAL
  Filled 2022-02-18 (×2): qty 1

## 2022-02-18 MED ORDER — POVIDONE-IODINE 7.5 % EX SOLN
Freq: Once | CUTANEOUS | Status: DC
  Filled 2022-02-18: qty 118

## 2022-02-18 MED ORDER — LIDOCAINE 2% (20 MG/ML) 5 ML SYRINGE
INTRAMUSCULAR | Status: DC | PRN
  Administered 2022-02-18: 100 mg via INTRAVENOUS

## 2022-02-18 MED ORDER — MIDAZOLAM HCL 2 MG/2ML IJ SOLN
INTRAMUSCULAR | Status: DC | PRN
  Administered 2022-02-18: 2 mg via INTRAVENOUS

## 2022-02-18 MED ORDER — PHENYLEPHRINE 80 MCG/ML (10ML) SYRINGE FOR IV PUSH (FOR BLOOD PRESSURE SUPPORT)
PREFILLED_SYRINGE | INTRAVENOUS | Status: DC | PRN
  Administered 2022-02-18: 160 ug via INTRAVENOUS

## 2022-02-18 MED ORDER — OXYCODONE-ACETAMINOPHEN 5-325 MG PO TABS
1.0000 | ORAL_TABLET | ORAL | Status: DC | PRN
  Administered 2022-02-18 – 2022-02-19 (×4): 2 via ORAL
  Filled 2022-02-18 (×4): qty 2

## 2022-02-18 MED ORDER — VANCOMYCIN HCL IN DEXTROSE 1-5 GM/200ML-% IV SOLN
1000.0000 mg | Freq: Once | INTRAVENOUS | Status: AC
  Administered 2022-02-18: 1000 mg via INTRAVENOUS
  Filled 2022-02-18: qty 200

## 2022-02-18 MED ORDER — METHOCARBAMOL 1000 MG/10ML IJ SOLN: 500.0000 mg | Freq: Four times a day (QID) | INTRAVENOUS | Status: DC | PRN

## 2022-02-18 MED ORDER — MENTHOL 3 MG MT LOZG: 1.0000 | LOZENGE | OROMUCOSAL | Status: DC | PRN

## 2022-02-18 MED ORDER — PHENYLEPHRINE HCL-NACL 20-0.9 MG/250ML-% IV SOLN
INTRAVENOUS | Status: DC | PRN
  Administered 2022-02-18: 50 ug/min via INTRAVENOUS

## 2022-02-18 MED ORDER — AMLODIPINE BESYLATE 10 MG PO TABS
10.0000 mg | ORAL_TABLET | Freq: Every day | ORAL | Status: DC
  Administered 2022-02-19: 10 mg via ORAL
  Filled 2022-02-18 (×2): qty 1

## 2022-02-18 MED ORDER — FENTANYL CITRATE (PF) 250 MCG/5ML IJ SOLN
INTRAMUSCULAR | Status: AC
  Filled 2022-02-18: qty 5

## 2022-02-18 MED ORDER — ONDANSETRON HCL 4 MG/2ML IJ SOLN
INTRAMUSCULAR | Status: DC | PRN
  Administered 2022-02-18: 4 mg via INTRAVENOUS

## 2022-02-18 MED ORDER — BUPIVACAINE-EPINEPHRINE 0.25% -1:200000 IJ SOLN
INTRAMUSCULAR | Status: DC | PRN
  Administered 2022-02-18: 30 mL

## 2022-02-18 MED ORDER — VANCOMYCIN HCL IN DEXTROSE 1-5 GM/200ML-% IV SOLN
1000.0000 mg | INTRAVENOUS | Status: AC
  Administered 2022-02-18: 1000 mg via INTRAVENOUS
  Filled 2022-02-18: qty 200

## 2022-02-18 MED ORDER — PROPOFOL 10 MG/ML IV BOLUS
INTRAVENOUS | Status: DC | PRN
  Administered 2022-02-18: 140 mg via INTRAVENOUS

## 2022-02-18 MED ORDER — PROMETHAZINE HCL 12.5 MG PO TABS: 12.5000 mg | ORAL_TABLET | Freq: Four times a day (QID) | ORAL | Status: DC | PRN

## 2022-02-18 MED ORDER — 0.9 % SODIUM CHLORIDE (POUR BTL) OPTIME
TOPICAL | Status: DC | PRN
  Administered 2022-02-18: 1000 mL

## 2022-02-18 MED ORDER — METHOCARBAMOL 750 MG PO TABS
750.0000 mg | ORAL_TABLET | Freq: Four times a day (QID) | ORAL | Status: DC
  Administered 2022-02-18 – 2022-02-19 (×3): 750 mg via ORAL
  Filled 2022-02-18 (×4): qty 1

## 2022-02-18 MED ORDER — QUETIAPINE FUMARATE 25 MG PO TABS: 25.0000 mg | ORAL_TABLET | Freq: Every evening | ORAL | Status: DC | PRN

## 2022-02-18 MED ORDER — PREGABALIN 75 MG PO CAPS
150.0000 mg | ORAL_CAPSULE | Freq: Three times a day (TID) | ORAL | Status: DC
  Administered 2022-02-18 – 2022-02-19 (×3): 150 mg via ORAL
  Filled 2022-02-18 (×4): qty 2

## 2022-02-18 MED ORDER — PROPOFOL 1000 MG/100ML IV EMUL
INTRAVENOUS | Status: AC
  Filled 2022-02-18: qty 100

## 2022-02-18 MED ORDER — ACETAMINOPHEN 325 MG PO TABS: 650.0000 mg | ORAL_TABLET | ORAL | Status: DC | PRN

## 2022-02-18 MED ORDER — METHOCARBAMOL 500 MG PO TABS: 500.0000 mg | ORAL_TABLET | Freq: Four times a day (QID) | ORAL | Status: DC | PRN

## 2022-02-18 SURGICAL SUPPLY — 109 items
BAG COUNTER SPONGE SURGICOUNT (BAG) ×4 IMPLANT
BENZOIN TINCTURE PRP APPL 2/3 (GAUZE/BANDAGES/DRESSINGS) ×2 IMPLANT
BLADE CLIPPER SURG (BLADE) IMPLANT
BLADE ILLUMINATOR MIS (MISCELLANEOUS) IMPLANT
BLADE SURG 10 STRL SS (BLADE) ×2 IMPLANT
BONE VIVIGEN FORMABLE 10CC (Bone Implant) ×2 IMPLANT
BUR PRESCISION 1.7 ELITE (BURR) ×2 IMPLANT
BUR ROUND FLUTED 5 RND (BURR) ×2 IMPLANT
BUR ROUND PRECISION 4.0 (BURR) IMPLANT
BUR SABER RD CUTTING 3.0 (BURR) IMPLANT
CANNULA A INSULATED (CANNULA) IMPLANT
CANNULA B INSULATED (CANNULA) IMPLANT
CLIP NEUROVISION LG (CLIP) IMPLANT
CNTNR URN SCR LID CUP LEK RST (MISCELLANEOUS) ×2 IMPLANT
CONNECTOR EXPEDIUM TI 55MM (Connector) IMPLANT
CONT SPEC 4OZ STRL OR WHT (MISCELLANEOUS)
COVER BACK TABLE 80X110 HD (DRAPES) ×2 IMPLANT
COVER MAYO STAND STRL (DRAPES) ×4 IMPLANT
COVER SURGICAL LIGHT HANDLE (MISCELLANEOUS) ×4 IMPLANT
DRAIN CHANNEL 15F RND FF W/TCR (WOUND CARE) IMPLANT
DRAPE C-ARM 42X72 X-RAY (DRAPES) ×4 IMPLANT
DRAPE C-ARMOR (DRAPES) ×2 IMPLANT
DRAPE POUCH INSTRU U-SHP 10X18 (DRAPES) ×4 IMPLANT
DRAPE SURG 17X23 STRL (DRAPES) ×16 IMPLANT
DURAPREP 26ML APPLICATOR (WOUND CARE) ×4 IMPLANT
ELECT BLADE 4.0 EZ CLEAN MEGAD (MISCELLANEOUS)
ELECT BLADE 6.5 EXT (BLADE) ×2 IMPLANT
ELECT CAUTERY BLADE 6.4 (BLADE) ×4 IMPLANT
ELECT NVM5 SURFACE MEP/EMG (ELECTRODE) IMPLANT
ELECT REM PT RETURN 9FT ADLT (ELECTROSURGICAL) ×4
ELECTRODE BLDE 4.0 EZ CLN MEGD (MISCELLANEOUS) ×2 IMPLANT
ELECTRODE REM PT RTRN 9FT ADLT (ELECTROSURGICAL) ×4 IMPLANT
EVACUATOR SILICONE 100CC (DRAIN) IMPLANT
FILTER STRAW FLUID ASPIR (MISCELLANEOUS) ×2 IMPLANT
FORCEPS BIPOLAR BAYONET STR (FORCEP) IMPLANT
FORCEPS BPLR BAYO 10IN 1.0TIP (ORTHOPEDIC DISPOSABLE SUPPLIES) IMPLANT
GAUZE 4X4 16PLY ~~LOC~~+RFID DBL (SPONGE) ×4 IMPLANT
GAUZE SPONGE 4X4 12PLY STRL (GAUZE/BANDAGES/DRESSINGS) ×2 IMPLANT
GAUZE SPONGE 4X4 12PLY STRL LF (GAUZE/BANDAGES/DRESSINGS) IMPLANT
GLOVE BIO SURGEON STRL SZ7 (GLOVE) ×6 IMPLANT
GLOVE BIO SURGEON STRL SZ8 (GLOVE) ×4 IMPLANT
GLOVE BIOGEL PI IND STRL 7.0 (GLOVE) ×4 IMPLANT
GLOVE BIOGEL PI IND STRL 8 (GLOVE) ×4 IMPLANT
GLOVE SURG ENC MOIS LTX SZ6.5 (GLOVE) ×6 IMPLANT
GOWN STRL REUS W/ TWL LRG LVL3 (GOWN DISPOSABLE) ×6 IMPLANT
GOWN STRL REUS W/ TWL XL LVL3 (GOWN DISPOSABLE) ×6 IMPLANT
GOWN STRL REUS W/TWL LRG LVL3 (GOWN DISPOSABLE) ×4
GOWN STRL REUS W/TWL XL LVL3 (GOWN DISPOSABLE) ×4
GRAFT BNE MATRIX VG FRMBL L 10 (Bone Implant) IMPLANT
GUIDE QUICK CONNECT GL 12 (ORTHOPEDIC DISPOSABLE SUPPLIES) IMPLANT
IV CATH 14GX2 1/4 (CATHETERS) ×4 IMPLANT
K-WIRE  1.6X 450L (WIRE) ×2
K-WIRE 1.6X 450L (WIRE) ×2
KIT BASIN OR (CUSTOM PROCEDURE TRAY) ×4 IMPLANT
KIT PEDICLE ACCESS (KITS) IMPLANT
KIT POSITION SURG JACKSON T1 (MISCELLANEOUS) ×2 IMPLANT
KIT TURNOVER KIT B (KITS) ×4 IMPLANT
KNIFE BOYONETTED ANNULOTOMY (MISCELLANEOUS) IMPLANT
KWIRE 1.6X 450L (WIRE) IMPLANT
MARKER SKIN DUAL TIP RULER LAB (MISCELLANEOUS) ×6 IMPLANT
MODULE EMG NDL SSEP NVM5 (NEEDLE) IMPLANT
MODULE EMG NEEDLE SSEP NVM5 (NEEDLE) ×2 IMPLANT
NDL 18GX1X1/2 (RX/OR ONLY) (NEEDLE) ×2 IMPLANT
NDL 22X1.5 STRL (OR ONLY) (MISCELLANEOUS) ×4 IMPLANT
NDL HYPO 25GX1X1/2 BEV (NEEDLE) ×4 IMPLANT
NDL SPNL 18GX3.5 QUINCKE PK (NEEDLE) ×6 IMPLANT
NEEDLE 18GX1X1/2 (RX/OR ONLY) (NEEDLE) IMPLANT
NEEDLE 22X1.5 STRL (OR ONLY) (MISCELLANEOUS) IMPLANT
NEEDLE HYPO 22GX1.5 SAFETY (NEEDLE) IMPLANT
NEEDLE HYPO 25GX1X1/2 BEV (NEEDLE) ×2 IMPLANT
NEEDLE SPNL 18GX3.5 QUINCKE PK (NEEDLE) ×4 IMPLANT
NS IRRIG 1000ML POUR BTL (IV SOLUTION) ×6 IMPLANT
PACK LAMINECTOMY ORTHO (CUSTOM PROCEDURE TRAY) ×4 IMPLANT
PACK UNIVERSAL I (CUSTOM PROCEDURE TRAY) ×4 IMPLANT
PAD ARMBOARD 7.5X6 YLW CONV (MISCELLANEOUS) ×8 IMPLANT
PATTIES SURGICAL .5 X1 (DISPOSABLE) ×2 IMPLANT
PATTIES SURGICAL .5X1.5 (GAUZE/BANDAGES/DRESSINGS) ×2 IMPLANT
PROBE BALL TIP NVM5 SNG USE (BALLOONS) IMPLANT
ROD SPINAL EXP 5.5X60 (Rod) IMPLANT
SCREW SET SINGLE INNER (Screw) IMPLANT
SCREW VIPER CORT FIX 6X35 (Screw) IMPLANT
SHIM DISC ALUMINUM (MISCELLANEOUS) IMPLANT
SHIM WIDENING (MISCELLANEOUS) IMPLANT
SPACER RISE-L 18X55 7-14MM (Spacer) IMPLANT
SPONGE INTESTINAL PEANUT (DISPOSABLE) ×6 IMPLANT
SPONGE SURGIFOAM ABS GEL 100 (HEMOSTASIS) ×2 IMPLANT
SPONGE T-LAP 4X18 ~~LOC~~+RFID (SPONGE) ×2 IMPLANT
STAPLER VISISTAT 35W (STAPLE) ×2 IMPLANT
STRIP CLOSURE SKIN 1/2X4 (GAUZE/BANDAGES/DRESSINGS) ×4 IMPLANT
SURGIFLO W/THROMBIN 8M KIT (HEMOSTASIS) IMPLANT
SUT MNCRL AB 4-0 PS2 18 (SUTURE) ×4 IMPLANT
SUT VIC AB 0 CT1 18XCR BRD 8 (SUTURE) ×4 IMPLANT
SUT VIC AB 0 CT1 8-18 (SUTURE) ×2
SUT VIC AB 1 CT1 18XCR BRD 8 (SUTURE) ×4 IMPLANT
SUT VIC AB 1 CT1 8-18 (SUTURE) ×4
SUT VIC AB 2-0 CT2 18 VCP726D (SUTURE) ×4 IMPLANT
SYR 20ML LL LF (SYRINGE) ×4 IMPLANT
SYR BULB IRRIG 60ML STRL (SYRINGE) ×4 IMPLANT
SYR CONTROL 10ML LL (SYRINGE) ×6 IMPLANT
SYR TB 1ML LUER SLIP (SYRINGE) ×2 IMPLANT
TAP EXPEDIUM DL 4.35 (INSTRUMENTS) IMPLANT
TAP EXPEDIUM DL 5.0 (INSTRUMENTS) IMPLANT
TAP EXPEDIUM DL 6.0 (INSTRUMENTS) IMPLANT
TAPE CLOTH SOFT 2X10 (GAUZE/BANDAGES/DRESSINGS) IMPLANT
TOWEL GREEN STERILE (TOWEL DISPOSABLE) ×2 IMPLANT
TOWEL GREEN STERILE FF (TOWEL DISPOSABLE) ×2 IMPLANT
TRAY FOLEY MTR SLVR 16FR STAT (SET/KITS/TRAYS/PACK) ×4 IMPLANT
WATER STERILE IRR 1000ML POUR (IV SOLUTION) ×4 IMPLANT
YANKAUER SUCT BULB TIP NO VENT (SUCTIONS) ×4 IMPLANT

## 2022-02-18 NOTE — Anesthesia Procedure Notes (Signed)
Procedure Name: Intubation Date/Time: 02/18/2022 7:50 AM  Performed by: Elvin So, CRNAPre-anesthesia Checklist: Patient identified, Emergency Drugs available, Suction available and Patient being monitored Patient Re-evaluated:Patient Re-evaluated prior to induction Oxygen Delivery Method: Circle System Utilized Preoxygenation: Pre-oxygenation with 100% oxygen Induction Type: IV induction Ventilation: Mask ventilation without difficulty Laryngoscope Size: Mac and 3 Grade View: Grade I Tube type: Oral Tube size: 7.0 mm Number of attempts: 1 Airway Equipment and Method: Stylet and Oral airway Placement Confirmation: ETT inserted through vocal cords under direct vision, positive ETCO2 and breath sounds checked- equal and bilateral Secured at: 21 cm Tube secured with: Tape Dental Injury: Teeth and Oropharynx as per pre-operative assessment

## 2022-02-18 NOTE — Transfer of Care (Signed)
Immediate Anesthesia Transfer of Care Note  Patient: Desiree Thomas  Procedure(s) Performed: LEFT-SIDED LUMBAR 3 - LUMBAR 4 LATERAL INTERBODY FUSION WITH INSTRUMENTATION AND ALLOGRAFT (Left: Spine Lumbar) POSTERIOR DECOMPRESSION AND FUSION LUMBAR 3- LUMBAR 4  WITH INSTRUMENTATION AND ALLOGRAFT (Spine Lumbar)  Patient Location: PACU  Anesthesia Type:General  Level of Consciousness: sedated  Airway & Oxygen Therapy: Patient Spontanous Breathing and Patient connected to face mask oxygen  Post-op Assessment: Report given to RN, Post -op Vital signs reviewed and stable and Patient moving all extremities  Post vital signs: Reviewed and stable  Last Vitals:  Vitals Value Taken Time  BP 184/92 02/18/22 1149  Temp    Pulse 67 02/18/22 1153  Resp 9 02/18/22 1153  SpO2 97 % 02/18/22 1153  Vitals shown include unvalidated device data.  Last Pain:  Vitals:   02/18/22 0651  TempSrc:   PainSc: 8       Patients Stated Pain Goal: 5 (02/18/22 7564)  Complications: No notable events documented.

## 2022-02-18 NOTE — Progress Notes (Signed)
Pt flopping all over the bed unable to keep calm for blood pressure

## 2022-02-18 NOTE — Anesthesia Postprocedure Evaluation (Signed)
Anesthesia Post Note  Patient: Desiree Thomas  Procedure(s) Performed: LEFT-SIDED LUMBAR 3 - LUMBAR 4 LATERAL INTERBODY FUSION WITH INSTRUMENTATION AND ALLOGRAFT (Left: Spine Lumbar) POSTERIOR DECOMPRESSION AND FUSION LUMBAR 3- LUMBAR 4  WITH INSTRUMENTATION AND ALLOGRAFT (Spine Lumbar)     Patient location during evaluation: PACU Anesthesia Type: General Level of consciousness: awake and alert Pain management: pain level controlled Vital Signs Assessment: post-procedure vital signs reviewed and stable Respiratory status: spontaneous breathing, nonlabored ventilation and respiratory function stable Cardiovascular status: blood pressure returned to baseline Postop Assessment: no apparent nausea or vomiting Anesthetic complications: no   No notable events documented.  Last Vitals:  Vitals:   02/18/22 1300 02/18/22 1315  BP: (!) 137/108 (!) 151/86  Pulse: 74 77  Resp: 12 10  Temp:  36.7 C  SpO2: 97% 92%    Last Pain:  Vitals:   02/18/22 1315  TempSrc:   PainSc: Asleep                 Shanda Howells

## 2022-02-18 NOTE — Op Note (Signed)
PATIENT NAME: Desiree Thomas   MEDICAL RECORD NO.:   [id number]    DATE OF BIRTH: [birth year]   DATE OF PROCEDURE: 02/18/2022                               OPERATIVE REPORT   PREOPERATIVE DIAGNOSES: 1.  Spinal stenosis, L3-4 2.  Severe L3-4 degenerative disc disease 3.  L3-4 spondylolisthesis 4.  Bilateral lumbar radiculopathy   POSTOPERATIVE DIAGNOSES: 1.  Spinal stenosis, L3-4 2.  Severe L3-4 degenerative disc disease 3.  L3-4 spondylolisthesis 4.  Bilateral lumbar radiculopathy   PROCEDURE:  1.  Left-sided lateral interbody fusion, L3/4  via direct lateral retroperitoneal approach. 2.  Insertion of interbody device x1 (18 mm x 55 mm Globus intervertebral spacer). 3.  Use of morselized allograft -- ViviGen.   4.  Intraoperative use of fluoroscopy. 5.  Posterior spinal fusion, L3-4 6.  Placement of posterior instrumentation, L3, L4 7.  L3-4 decompression, including bilateral partial facetectomy and lateral recess decompression   SURGEON:  Estill Bamberg, MD   ASSISTANT:  Jason Coop PA-C.   ANESTHESIA:  General endotracheal anesthesia.   COMPLICATIONS:  None.   DISPOSITION:  Stable.   ESTIMATED BLOOD LOSS:  Minimal.   INDICATIONS:  Briefly, Ms. Lea is a very pleasant 61 year old female who did present to me with ongoing pain in the bilateral legs, increased with standing and walking.  The patient's advanced imaging studies were as reflected above.  She had failed appropriate conservative treatment measures. Given her ongoing pain and dysfunction, we did discuss proceeding with the procedure reflected above.  The patient did wish to proceed, after a full understanding of the risks and benefits of surgery.   DESCRIPTION OF PROCEDURE:  On 02/18/2022, the patient was brought to surgery and general endotracheal anesthesia was administered.  The patient was placed in the lateral decubitus position, with the left side up.  Neurologic monitoring leads were placed by the  monitoring technician.  The patient's torso and lower extremities were secured to the bed.  The patient's hips and knees were flexed in order to lessen the tension on the psoas musculature.  The left flank was then prepped and draped in the usual sterile fashion.  The bed was flexed, in order to optimize exposure to the L3/4 intervertebral space.  After a timeout procedure was performed, a left-sided transverse incision was made over the left flank overlying the L3/4 intervertebral space.  The retroperitoneal space was encountered, after dissection through the oblique musculature.  The peritoneum was bluntly swept anteriorly, and the psoas was readily identified.  I did use a series of dilators to dock over the L3/4 intervertebral space.  I did use neurologic monitoring while placing the  dilators, in order to ensure that there were no neurologic structures in the immediate vicinity of the dilators.  The lumbar plexus was noted to be posterior.  A self-retaining retractor was placed, and was attached to a rigid arm.  The retractor was very gently dilated and a shim was placed into the L3/4 intervertebral space.  I then used a knife to perform an annulotomy at the lateral aspect of the L3/4 intervertebral space.  I then used a series of curettes and pituitary rongeurs in order to perform a thorough and complete L3/4 intervertebral diskectomy.  The contralateral annulus was released.  I then placed a series of intervertebral spacer trials, and I did feel that a 18 mm x  55 mm spacer would be the most appropriate fit.  The appropriate spacer was then packed with ViviGen and tamped into position.  It was then expanded to approximately 10.75 mm in height. I was very pleased with the final resting position of the intervertebral spacer.  Excellent height restoration was noted.  I was very pleased with the final AP and lateral fluoroscopic images and the excellent restoration of disk height identified on both AP and lateral  images.  At this point, the wound was copiously irrigated.  The fascia, internal, and external oblique musculature was closed using #1 Vicryl.  The subcutaneous layer was  closed using 2-0 Vicryl and the skin was closed using 4-0 Monocryl.   At this point, a sterile dressing was applied, and the drapes were removed.  The patient was then placed prone on a well-padded flat Jackson bed with a spinal frame.  The back was prepped and draped in the usual sterile fashion.  I then made a midline incision, in line with the patient's previous incision.  The paraspinal musculature was bluntly swept laterally, and a self-retaining retractor was placed.  The previously identified hardware on the right and left sides was noted.  At this point, I proceeded with the decompressive aspect of the procedure.  Using a high-speed bur in addition to a series of Kerrison punches, I did perform a lateral recess decompression on the right and left sides, removing abundant facet hypertrophy.  I was very pleased with the decompression.  At this point, I did evaluate the previously placed hardware bilaterally.  On the right side, I did not feel there was enough space between the L4 and L5 pedicle screws to except a cross connector.  I therefore did proceed with cannulation of the left L3 pedicle.  Using a medial to lateral cortical trajectory technique, I did use a gearshift probe followed by a 5 mm tap, followed by a 6 mm tap.  At this point, a 6 x 35 mm screw was placed into the left L3 pedicle.  A 60 mm rod was secured into the tulip head of the left L3 screw.  Inferiorly, the rod was inserted into a connector, which was secured into the previously placed rod on the left side, between the L4 and L5 pedicles.  Once appropriately secured, a cap was placed over the left L3 pedicle screw, and the cross connector inferiorly was tightened, followed by a final locking procedure, and the left L3 was final tightened as well.  At this point, the  remainder of the Vivigen was packed along the facet joints and posterior lateral gutters on the left side, across the L3-4 segment, in order to aid in the success of the posterolateral fusion. the wound was copiously irrigated.  I was very pleased with the AP and lateral fluoroscopic images.  The wound was then closed using #1 Vicryl followed by 2-0 Vicryl followed by 4-0 Monocryl.  Benzoin and Steri-Strips were applied over the left lateral wound and left posterior wound, followed by sterile dressing.       Of note, I did use neurologic monitoring throughout the entire surgery, and there was no abnormal or sustained EMG activity noted throughout the entire surgery. All instrument counts were correct at the termination of the procedure.   Of note, Jason Coop was my assistant throughout surgery, and did aid in retraction, placement of the hardware, suctioning, the decompression, and closure, throughout the lateral and posterior portions of the procedure.  Estill Bamberg, MD

## 2022-02-18 NOTE — Progress Notes (Signed)
Pharmacy Antibiotic Note  Desiree Thomas is a 61 y.o. female admitted on 02/18/2022.  Pharmacy has been consulted for vancomycin dosing for surgical prophylaxis. No drain in place per RN.   Plan: Vancomycin 1000mg  x1   Height: 5\' 2"  (157.5 cm) Weight: 74.4 kg (164 lb) IBW/kg (Calculated) : 50.1  Temp (24hrs), Avg:98 F (36.7 C), Min:97 F (36.1 C), Max:98.5 F (36.9 C)  No results for input(s): "WBC", "CREATININE", "LATICACIDVEN", "VANCOTROUGH", "VANCOPEAK", "VANCORANDOM", "GENTTROUGH", "GENTPEAK", "GENTRANDOM", "TOBRATROUGH", "TOBRAPEAK", "TOBRARND", "AMIKACINPEAK", "AMIKACINTROU", "AMIKACIN" in the last 168 hours.  Estimated Creatinine Clearance: 70.6 mL/min (by C-G formula based on SCr of 0.8 mg/dL).    Allergies  Allergen Reactions   Morphine And Related Anaphylaxis   Penicillins Hives and Itching   Percocet [Oxycodone-Acetaminophen] Itching    OK with benadryl   Topamax [Topiramate] Other (See Comments)    Emotional    Vicodin Hp [Hydrocodone-Acetaminophen] Itching    Severe    , PharmD, BCPS Clinical Pharmacist 02/18/2022 2:11 PM

## 2022-02-18 NOTE — H&P (Signed)
PREOPERATIVE H&P  Chief Complaint: Back pain, bilateral leg pain  HPI: Desiree Thomas is a 61 y.o. female who presents with ongoing pain in the back and bilateral legs  MRI reveals stenosis and instability at L3/4   Patient has failed multiple forms of conservative care and continues to have pain (see office notes for additional details regarding the patient's full course of treatment)  Past Medical History:  Diagnosis Date   Allergy    Arthritis    Asthma    no inhaler - as a young adult, no current problems   Chronic back pain    Depression    GERD (gastroesophageal reflux disease)    no meds, diet controlled   Hearing loss    45% on right ear   History of meningitis    1992   Hyperlipidemia    Hypertension    Migraine    Past Surgical History:  Procedure Laterality Date   ABDOMINAL HYSTERECTOMY     still has ovaries   APPENDECTOMY     CESAREAN SECTION     x2   COLONOSCOPY  2019   cyst removed from ankle     KNEE SURGERY  02/2013   KNEE SURGERY Bilateral    arthroscopy   LUMBAR FUSION  02/2014   LUMBAR SPINE SURGERY     fusion   trigger finger of thumb Bilateral    TRIGGER FINGER RELEASE Right 09/21/2021   Procedure: RIGHT RING AND SMALL TRIGGER FINGER RELEASE and INJECTIONS OF RIGHT INDEX AND LONG FINGER;  Surgeon: Ernestina Columbia, MD;  Location: [facility] OR;  Service: Orthopedics;  Laterality: Right;   Social History   Socioeconomic History   Marital status: Married    Spouse name: Not on file   Number of children: Not on file   Years of education: Not on file   Highest education level: Not on file  Occupational History   Not on file  Tobacco Use   Smoking status: Every Day    Packs/day: 0.25    Years: 11.00    Total pack years: 2.75    Types: Cigarettes   Smokeless tobacco: Never   Tobacco comments:    Since 2012  Vaping Use   Vaping Use: Former   Substances: Nicotine, Flavoring  Substance and Sexual Activity   Alcohol use: Yes    Comment:  seldom- every 5-6 months will have a drink   Drug use: Yes    Frequency: 2.0 times per week    Types: Marijuana    Comment: a blunt per day, states it helps with her pain   Sexual activity: Yes    Birth control/protection: Surgical    Comment: hysterectomy  Other Topics Concern   Not on file  Social History Narrative   Married, Christian.  Disabled.  Has 2 children .  08/2020      Caffeine: 1 cup coffee/day, 1/2 pepsi per day, maybe some tea   Social Determinants of Health   Financial Resource Strain: Low Risk  (04/10/2021)   Overall Financial Resource Strain (CARDIA)    Difficulty of Paying Living Expenses: Not hard at all  Food Insecurity: No Food Insecurity (04/10/2021)   Hunger Vital Sign    Worried About Running Out of Food in the Last Year: Never true    Ran Out of Food in the Last Year: Never true  Transportation Needs: No Transportation Needs (04/10/2021)   PRAPARE - Administrator, Civil Service (Medical): No  Lack of Transportation (Non-Medical): No  Physical Activity: Inactive (04/10/2021)   Exercise Vital Sign    Days of Exercise per Week: 0 days    Minutes of Exercise per Session: 0 min  Stress: No Stress Concern Present (04/10/2021)   [place]    Feeling of Stress : Not at all  Social Connections: Not on file   Family History  Problem Relation Age of Onset   Arthritis Mother    Heart disease Mother    Asthma Mother    Diverticulitis Mother    Migraines Mother    Diabetes Father    Diverticulitis Father    Arthritis Sister    Asthma Sister    Migraines Sister    Arthritis Sister    Asthma Sister    Cancer Maternal Grandmother        stomach   Diabetes Paternal Grandmother    Dementia Paternal Grandmother    Migraines Daughter    Allergies  Allergen Reactions   Morphine And Related Anaphylaxis   Penicillins Hives and Itching   Percocet [Oxycodone-Acetaminophen] Itching     OK with benadryl   Topamax [Topiramate] Other (See Comments)    Emotional    Vicodin Hp [Hydrocodone-Acetaminophen] Itching    Severe    Prior to Admission medications   Medication Sig Start Date End Date Taking? Authorizing Provider  amLODipine (NORVASC) 10 MG tablet TAKE 1 TABLET(10 MG) BY MOUTH DAILY 01/27/22  Yes Tysinger, Camelia Eng, PA-C  atorvastatin (LIPITOR) 40 MG tablet TAKE 1 TABLET(40 MG) BY MOUTH DAILY 10/14/21  Yes Tysinger, Camelia Eng, PA-C  diphenhydrAMINE (BENADRYL) 25 MG tablet Take 50 mg by mouth every 6 (six) hours as needed for allergies. Takes with Percocet   Yes [provider]  docusate sodium (PHILLIPS STOOL SOFTENER) 100 MG capsule Take 100 mg by mouth daily.   Yes [provider]  FLUoxetine (PROZAC) 20 MG capsule TAKE 1 CAPSULE(20 MG) BY MOUTH DAILY 11/17/21  Yes Tysinger, Camelia Eng, PA-C  loratadine (CLARITIN) 10 MG tablet Take 10 mg by mouth daily.   Yes [provider]  magnesium gluconate (MAGONATE) 500 MG tablet Take 500 mg by mouth daily.   Yes [provider]  methocarbamol (ROBAXIN) 750 MG tablet Take 750 mg by mouth in the morning and at bedtime. 07/30/21  Yes [provider]  naproxen sodium (ALEVE) 220 MG tablet Take 440 mg by mouth daily as needed (pain).   Yes [provider]  ondansetron (ZOFRAN-ODT) 8 MG disintegrating tablet Take 8 mg by mouth every 8 (eight) hours as needed for nausea or vomiting.   Yes [provider]  oxyCODONE-acetaminophen (PERCOCET/ROXICET) 5-325 MG tablet Take 1 tablet by mouth 2 (two) times daily as needed for severe pain. 08/09/21  Yes [provider]  pregabalin (LYRICA) 150 MG capsule TAKE 1 CAPSULE BY MOUTH EVERY MORNING, AFTERNOON AND EVENING. 11/23/21  Yes Tysinger, Camelia Eng, PA-C  propranolol ER (INDERAL LA) 60 MG 24 hr capsule Take 1 capsule (60 mg total) by mouth at bedtime. 01/27/22  Yes Tysinger, Camelia Eng, PA-C  QUEtiapine (SEROQUEL) 25 MG tablet Take 1  tablet (25 mg total) by mouth at bedtime as needed (sleep). 12/09/21  Yes Tysinger, Camelia Eng, PA-C  rizatriptan (MAXALT) 5 MG tablet Take 5 mg by mouth as needed for migraine. May repeat in 2 hours if needed   Yes [provider]  solifenacin (VESICARE) 5 MG tablet TAKE 1 TABLET(5 MG)  BY MOUTH DAILY 11/23/21  Yes Tysinger, Camelia Eng, PA-C  traZODone (DESYREL) 50 MG tablet Take 1 tablet (50 mg total) by mouth at bedtime. 12/09/21  Yes Tysinger, Camelia Eng, PA-C  trolamine salicylate (ASPERCREME) 10 % cream Apply 1 Application topically as needed for muscle pain.   Yes [provider]  Turmeric Curcumin 500 MG CAPS Take 500 mg by mouth daily.   Yes [provider]  Vitamin D, Ergocalciferol, (DRISDOL) 1.25 MG (50000 UNIT) CAPS capsule TAKE 1 CAPSULE BY MOUTH EVERY 7 DAYS Patient taking differently: Take 50,000 Units by mouth every Saturday. 01/27/22  Yes Tysinger, Camelia Eng, PA-C  meloxicam (MOBIC) 15 MG tablet Take 15 mg by mouth daily.    [provider]  promethazine (PHENERGAN) 12.5 MG tablet Take 1 tablet (12.5 mg total) by mouth every 6 (six) hours as needed for nausea or vomiting. Patient not taking: Reported on 02/01/2022 09/21/21   Georgeanna Harrison, MD     All other systems have been reviewed and were otherwise negative with the exception of those mentioned in the HPI and as above.  Physical Exam: Vitals:   02/18/22 0559  BP: 131/76  Pulse: 60  Resp: 17  Temp: 98.5 F (36.9 C)  SpO2: 98%    Body mass index is 30 kg/m.  General: Alert, no acute distress Cardiovascular: No pedal edema Respiratory: No cyanosis, no use of accessory musculature Skin: No lesions in the area of chief complaint Neurologic: Sensation intact distally Psychiatric: Patient is competent for consent with normal mood and affect Lymphatic: No axillary or cervical lymphadenopathy   Assessment/Plan: Severe back pain and bilateral leg pain with weakness, due to  severe degeneration,  a spondylolisthesis and stenosis at the level above her fusion, at L3-4. Plan for Procedure(s): LEFT-SIDED LUMBAR 3 - LUMBAR 4 LATERAL INTERBODY FUSION WITH INSTRUMENTATION AND ALLOGRAFT POSTERIOR DECOMPRESSION AND FUSION LUMBAR 3- LUMBAR 4  WITH INSTRUMENTATION AND ALLOGRAFT   Norva Karvonen, MD 02/18/2022 6:42 AM

## 2022-02-19 MED ORDER — OXYCODONE-ACETAMINOPHEN 5-325 MG PO TABS: 1.0000 | ORAL_TABLET | ORAL | 0 refills | Status: DC | PRN

## 2022-02-19 MED ORDER — METHOCARBAMOL 750 MG PO TABS: 750.0000 mg | ORAL_TABLET | Freq: Four times a day (QID) | ORAL | 2 refills | Status: DC

## 2022-02-19 NOTE — Progress Notes (Signed)
    Patient doing well  Denies leg pain Hs been ambulating   Physical Exam: Vitals:   02/19/22 0021 02/19/22 0400  BP: 136/79 (!) 152/95  Pulse: 78 76  Resp: 20 20  Temp: 98.8 F (37.1 C) 99.1 F (37.3 C)  SpO2: 97% 99%    Dressing in place NVI  POD #1 s/p L3/4 decompression and lateral/posterior fusion, doing well  - up with PT/OT, encourage ambulation - Percocet for pain, Robaxin for muscle spasms - d/c home today with f/u in 2 weeks

## 2022-02-19 NOTE — Progress Notes (Signed)
PT Cancellation Note  Patient Details Name: Desiree Thomas MRN: [id number] DOB: 1961/02/10   Cancelled Treatment:    Reason Eval/Treat Not Completed: PT screened, no needs identified, will sign off; OT reports pt ambulatory and able to practice steps.  Checked in with pt and reports not needing device right now for ambulation and has family to assist.  Encouraged following precautions to avoid further back surgery needs.  PT will sign off.    Elray Mcgregor 02/19/2022, 10:18 AM Sheran Lawless, PT Acute Rehabilitation Services [phone] 02/19/2022

## 2022-02-19 NOTE — Plan of Care (Incomplete)
  Problem: Education: Goal: Ability to verbalize activity precautions or restrictions will improve Outcome: Completed/Met Goal: Knowledge of the prescribed therapeutic regimen will improve Outcome: Completed/Met Goal: Understanding of discharge needs will improve Outcome: Completed/Met  Patient alert and oriented, void, ambulate, VSS. Surgical site clean and dry no sign of infection. D/c instructions explain and given to the patient all questions answered. Pt d/c home per order.

## 2022-02-19 NOTE — Evaluation (Signed)
Occupational Therapy Evaluation Patient Details Name: Desiree Thomas MRN: [id number] DOB: Jul 09, 1960 Today's Date: 02/19/2022   History of Present Illness Desiree Thomas is a 61 y/o female admitted 02/18/22 for scheduled L3/4 decompression and lateral/posterior fusion. PMH includes Asthma, Chronic back pain, Depression, Hearing loss, History of meningitis, Hyperlipidemia, Hypertension, Lumbar fusion (02/2014); Knee surgery (Bilateral); Abdominal hysterectomy; Appendectomy; Trigger finger release (Right, 09/21/2021); cyst removed from ankle; and trigger finger of thumb (Bilateral).   Clinical Impression   Pt is typically independent in ADL and mobility. 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 Today she is overall supervision for transfers and mobility. She is min A for UB ADL, and mod A for LB in regards to getting below the knee to dress/bathe. She reports that she will have assist from her husband and sister-in-law. Pt provided back handout, back precautions and no "BLT" and reviewed with specific focus on ADL and compensatory strategies for bathing/dressing/grooming. Educated that short bouts of walking will help with pain management and surgical swelling. Pt able to perform 5 stairs during session. Pt verbalized understanding of all education and no further questions. OT to sign off at this time.     Recommendations for follow up therapy are one component of a multi-disciplinary discharge planning process, led by the attending physician.  Recommendations may be updated based on patient status, additional functional criteria and insurance authorization.   Follow Up Recommendations  No OT follow up     Assistance Recommended at Discharge Intermittent Supervision/Assistance  Patient can return home with the following A little help with bathing/dressing/bathroom;Assistance with cooking/housework;Assist for transportation;Help with stairs or ramp for entrance    Functional Status  Assessment  Patient has had a recent decline in their functional status and demonstrates the ability to make significant improvements in function in a reasonable and predictable amount of time.  Equipment Recommendations  None recommended by OT (Pt has appropriate DME)    Recommendations for Other Services       Precautions / Restrictions Precautions Precautions: Back Precaution Booklet Issued: Yes (comment) Precaution Comments: in brace at all times except for night bathroom visits and sleeping/bathing Required Braces or Orthoses: Spinal Brace Spinal Brace: Thoracolumbosacral orthotic Restrictions Weight Bearing Restrictions: No      Mobility Bed Mobility Overal bed mobility: Modified Independent             General bed mobility comments: increased time, has a EZ bed that elevates like a hospital bed. reviewed log roll    Transfers Overall transfer level: Needs assistance Equipment used: Rolling walker (2 wheels) Transfers: Sit to/from Stand Sit to Stand: Supervision           General transfer comment: vc for safe hand placement, increased time      Balance Overall balance assessment: Mild deficits observed, not formally tested                                         ADL either performed or assessed with clinical judgement   ADL Overall ADL's : Needs assistance/impaired Eating/Feeding: Independent   Grooming: Supervision/safety;Standing;Cueing for compensatory techniques Grooming Details (indicate cue type and reason): educated on strategies for grooming to maintain back precautions Upper Body Bathing: Min guard;Cueing for compensatory techniques   Lower Body Bathing: Moderate assistance;Sitting/lateral leans Lower Body Bathing Details (indicate cue type and reason): for knees down only Upper Body Dressing : Sitting;Minimal  assistance Upper Body Dressing Details (indicate cue type and reason): able to don brace without cues, min A for  physical assist Lower Body Dressing: Moderate assistance;Sit to/from stand   Toilet Transfer: Supervision/safety;Rolling walker (2 wheels)   Toileting- Architect and Hygiene: Min guard;Sit to/from stand       Functional mobility during ADLs: Supervision/safety;Cueing for sequencing;Rolling walker (2 wheels) General ADL Comments: Pt verbalizing that husband and sister-in-law will be home and assisting     Vision Baseline Vision/History: 1 Wears glasses Ability to See in Adequate Light: 0 Adequate Patient Visual Report: No change from baseline       Perception     Praxis      Pertinent Vitals/Pain Pain Assessment Pain Assessment: 0-10 Pain Score: 9  Pain Location: operative site at back Pain Descriptors / Indicators: Discomfort, Sore, Constant Pain Intervention(s): Monitored during session, Repositioned     Hand Dominance Right   Extremity/Trunk Assessment Upper Extremity Assessment Upper Extremity Assessment: Overall WFL for tasks assessed   Lower Extremity Assessment Lower Extremity Assessment: Generalized weakness   Cervical / Trunk Assessment Cervical / Trunk Assessment: Back Surgery   Communication Communication Communication: No difficulties   Cognition Arousal/Alertness: Awake/alert Behavior During Therapy: WFL for tasks assessed/performed Overall Cognitive Status: Within Functional Limits for tasks assessed                                       General Comments  able to complete 5 steps    Exercises     Shoulder Instructions      Home Living Family/patient expects to be discharged to:: Private residence Living Arrangements: Spouse/significant other Available Help at Discharge: Family;Available 24 hours/day;Friend(s) Type of Home: House Home Access: Stairs to enter [employer] of Steps: 5 Entrance Stairs-Rails: Right Home Layout: One level     Bathroom Shower/Tub: Chief Strategy Officer:  Standard Bathroom Accessibility: Yes How Accessible: Accessible via walker Home Equipment: Rolling Walker (2 wheels);Shower seat;Toilet riser;Adaptive equipment Adaptive Equipment: Reacher Additional Comments: works at [employer] in the drive up window      Prior Functioning/Environment Prior Level of Function : Independent/Modified Independent;Working/employed;Driving                        OT Problem List: Decreased activity tolerance;Impaired balance (sitting and/or standing);Decreased knowledge of use of DME or AE;Decreased knowledge of precautions;Pain      OT Treatment/Interventions:      OT Goals(Current goals can be found in the care plan section) Acute Rehab OT Goals Patient Stated Goal: to walk pain free OT Goal Formulation: With patient Time For Goal Achievement: 03/05/22 Potential to Achieve Goals: Good  OT Frequency:      Co-evaluation              AM-PAC OT "6 Clicks" Daily Activity     Outcome Measure Help from another person eating meals?: None Help from another person taking care of personal grooming?: A Little Help from another person toileting, which includes using toliet, bedpan, or urinal?: A Little Help from another person bathing (including washing, rinsing, drying)?: A Lot Help from another person to put on and taking off regular upper body clothing?: A Little Help from another person to put on and taking off regular lower body clothing?: A Lot 6 Click Score: 17   End of Session Equipment Utilized During Treatment: Rolling walker (2  wheels);Back brace Nurse Communication: Mobility status;Precautions  Activity Tolerance: Patient tolerated treatment well Patient left: in bed;with call bell/phone within reach  OT Visit Diagnosis: Muscle weakness (generalized) (M62.81);Pain Pain - Right/Left:  (central) Pain - part of body:  (back)                Time: 4562-5638 OT Time Calculation (min): 31 min Charges:  OT General Charges $OT Visit:  1 Visit OT Evaluation $OT Eval Moderate Complexity: 1 Mod OT Treatments $Self Care/Home Management : 8-22 mins  Nyoka Cowden OTR/L Acute Rehabilitation Services Office: [phone]  E[facility] 02/19/2022, 9:18 AM

## 2022-02-22 ENCOUNTER — Telehealth: Payer: Self-pay

## 2022-02-22 ENCOUNTER — Encounter (HOSPITAL_COMMUNITY): Payer: Self-pay | Admitting: Orthopedic Surgery

## 2022-02-22 NOTE — Patient Outreach (Signed)
  Care Coordination TOC Note Transition Care Management Unsuccessful Follow-up Telephone Call  Date of discharge and from where:  02/19/22-  Attempts:  1st Attempt  Reason for unsuccessful TCM follow-up call:  No answer/busy     Antionette Fairy, RN,BSN,CCM [facility] Care Management Telephonic Care Management Coordinator Direct Phone: [phone] Toll Free: [phone] Fax: [phone]

## 2022-02-23 ENCOUNTER — Telehealth: Payer: Self-pay

## 2022-02-23 NOTE — Patient Outreach (Signed)
  Care Coordination TOC Note Transition Care Management Unsuccessful Follow-up Telephone Call  Date of discharge and from where:  02/19/22-[place]  Attempts:  3rd Attempt  Reason for unsuccessful TCM follow-up call:  Unable to reach patient      Desiree Thomas [facility] Care Management Telephonic Care Management Coordinator Direct Phone: [phone] Toll Free: [phone] Fax: [phone]

## 2022-02-23 NOTE — Patient Outreach (Signed)
  Care Coordination TOC Note Transition Care Management Unsuccessful Follow-up Telephone Call  Date of discharge and from where:  02/19/22-[place]  Attempts:  2nd Attempt  Reason for unsuccessful TCM follow-up call:  Left voice message    Alessandra Grout [facility] Care Management Telephonic Care Management Coordinator Direct Phone: [phone] Toll Free: [phone] Fax: [phone]

## 2022-02-26 ENCOUNTER — Telehealth: Payer: Self-pay | Admitting: Medical

## 2022-02-26 ENCOUNTER — Telehealth: Payer: Self-pay

## 2022-02-26 NOTE — Telephone Encounter (Signed)
Left message for patient to call back and schedule Medicare Annual Wellness Visit (AWV) either virtually or in office. Left  my Desiree Thomas number [phone]   Last AWV 04/10/21 please schedule with Nurse Health Adviser   45 min for awv-i and in office appointments 30 min for awv-s  phone/virtual appointments

## 2022-02-26 NOTE — Patient Outreach (Signed)
  Care Coordination TOC Note Transition Care Management Follow-up Telephone Call Date of discharge and from where: 02/19/22-[place] Dx: "radiculopathy" How have you been since you were released from the hospital? Incoming call from patient returning call. She voices that things have been going so well for her this time around compared to her previous back surgery. She is wearing back brae. She has been ambulating and getting around the home. She has some pain-states she will be calling surgeon office today to see if she can get refill on pain meds as she is almost out. Appetite good. She reports normal Ms with use of stool softener daily. Any questions or concerns? No  Items Reviewed: Did the pt receive and understand the discharge instructions provided? Yes  Medications obtained and verified? Yes  Other? Yes  Any new allergies since your discharge? No  Dietary orders reviewed? Yes Do you have support at home? Yes   Home Care and Equipment/Supplies: Were home health services ordered? not applicable If so, what is the name of the agency? N/A  Has the agency set up a time to come to the patient's home? not applicable Were any new equipment or medical supplies ordered?  No What is the name of the medical supply agency? N/A Were you able to get the supplies/equipment? not applicable Do you have any questions related to the use of the equipment or supplies? No  Functional Questionnaire: (I = Independent and D = Dependent) ADLs: A  Bathing/Dressing- A  Meal Prep- A  Eating- I  Maintaining continence- I  Transferring/Ambulation- I  Managing Meds- I  Follow up appointments reviewed:  PCP Hospital f/u appt confirmed? No   Specialist Hospital f/u appt confirmed? Yes  Scheduled to see surgeon on 03/03/22 @ 8am. Are transportation arrangements needed? No  If their condition worsens, is the pt aware to call PCP or go to the Emergency Dept.? Yes Was the patient provided with  contact information for the PCP's office or ED? Yes Was to pt encouraged to call back with questions or concerns? Yes  SDOH assessments and interventions completed:   Yes SDOH Interventions Today    Flowsheet Row Most Recent Value  SDOH Interventions   Food Insecurity Interventions Intervention Not Indicated  Transportation Interventions Intervention Not Indicated       Care Coordination Interventions:  Education provided    Encounter Outcome:  Pt. Visit Completed     Antionette Fairy, RN,BSN,CCM [facility] Care Management Telephonic Care Management Coordinator Direct Phone: [phone] Toll Free: [phone] Fax: [phone]

## 2022-03-04 ENCOUNTER — Other Ambulatory Visit: Payer: Self-pay | Admitting: Medical

## 2022-03-04 NOTE — Telephone Encounter (Signed)
Is this okay to refill? 

## 2022-03-06 ENCOUNTER — Other Ambulatory Visit: Payer: Self-pay | Admitting: Medical

## 2022-03-10 NOTE — Discharge Summary (Signed)
Patient ID: Desiree Thomas MRN: [id number] DOB/AGE: March 29, 1960 61 y.o.  Admit date: 02/18/2022 Discharge date: 02/19/2022  Admission Diagnoses:  Principal Problem:   Radiculopathy, lumbar region   Discharge Diagnoses:  Same  Past Medical History:  Diagnosis Date   Allergy    Arthritis    Asthma    no inhaler - as a young adult, no current problems   Chronic back pain    Depression    GERD (gastroesophageal reflux disease)    no meds, diet controlled   Hearing loss    45% on right ear   History of meningitis    1992   Hyperlipidemia    Hypertension    Migraine     Surgeries: Procedure(s): LEFT-SIDED LUMBAR 3 - LUMBAR 4 LATERAL INTERBODY FUSION WITH INSTRUMENTATION AND ALLOGRAFT POSTERIOR DECOMPRESSION AND FUSION LUMBAR 3- LUMBAR 4  WITH INSTRUMENTATION AND ALLOGRAFT on 02/18/2022   Consultants: None  Discharged Condition: Improved  Hospital Course: Desiree Thomas is an 61 y.o. female who was admitted 02/18/2022 for operative treatment of Radiculopathy, lumbar region. Patient has severe unremitting pain that affects sleep, daily activities, and work/hobbies. After pre-op clearance the patient was taken to the operating room on 02/18/2022 and underwent  Procedure(s): LEFT-SIDED LUMBAR 3 - LUMBAR 4 LATERAL INTERBODY FUSION WITH INSTRUMENTATION AND ALLOGRAFT POSTERIOR DECOMPRESSION AND FUSION LUMBAR 3- LUMBAR 4  WITH INSTRUMENTATION AND ALLOGRAFT.    Patient was given perioperative antibiotics:  Anti-infectives (From admission, onward)    Start     Dose/Rate Route Frequency Ordered Stop   02/18/22 2000  vancomycin (VANCOCIN) IVPB 1000 mg/200 mL premix        1,000 mg 200 mL/hr over 60 Minutes Intravenous  Once 02/18/22 1412 02/18/22 2036   02/18/22 0630  vancomycin (VANCOCIN) IVPB 1000 mg/200 mL premix        1,000 mg 200 mL/hr over 60 Minutes Intravenous On call to O.R. 02/18/22 9518 02/18/22 8416        Patient was given sequential compression devices, early  ambulation to prevent DVT.  Patient benefited maximally from hospital stay and there were no complications.    Recent vital signs: BP (!) 110/52 (BP Location: Right Arm)   Pulse 66   Temp 98.6 F (37 C)   Resp 18   Ht 5\' 2"  (1.575 m)   Wt 74.4 kg   SpO2 98%   BMI 30.00 kg/m    Discharge Medications:   Allergies as of 02/19/2022       Reactions   Morphine And Related Anaphylaxis   Penicillins Hives, Itching   Percocet [oxycodone-acetaminophen] Itching   OK with benadryl   Topamax [topiramate] Other (See Comments)   Emotional    Vicodin Hp [hydrocodone-acetaminophen] Itching   Severe         Medication List     TAKE these medications    amLODipine 10 MG tablet Commonly known as: NORVASC TAKE 1 TABLET(10 MG) BY MOUTH DAILY   atorvastatin 40 MG tablet Commonly known as: LIPITOR TAKE 1 TABLET(40 MG) BY MOUTH DAILY   diphenhydrAMINE 25 MG tablet Commonly known as: BENADRYL Take 50 mg by mouth every 6 (six) hours as needed for allergies. Takes with Percocet   FLUoxetine 20 MG capsule Commonly known as: PROZAC TAKE 1 CAPSULE(20 MG) BY MOUTH DAILY   loratadine 10 MG tablet Commonly known as: CLARITIN Take 10 mg by mouth daily.   magnesium gluconate 500 MG tablet Commonly known as: MAGONATE Take 500 mg by mouth daily.  methocarbamol 750 MG tablet Commonly known as: ROBAXIN Take 750 mg by mouth in the morning and at bedtime. What changed: Another medication with the same name was added. Make sure you understand how and when to take each.   methocarbamol 750 MG tablet Commonly known as: ROBAXIN Take 1 tablet (750 mg total) by mouth 4 (four) times daily. What changed: You were already taking a medication with the same name, and this prescription was added. Make sure you understand how and when to take each.   ondansetron 8 MG disintegrating tablet Commonly known as: ZOFRAN-ODT Take 8 mg by mouth every 8 (eight) hours as needed for nausea or vomiting.    oxyCODONE-acetaminophen 5-325 MG tablet Commonly known as: PERCOCET/ROXICET Take 1 tablet by mouth 2 (two) times daily as needed for severe pain. What changed: Another medication with the same name was added. Make sure you understand how and when to take each.   oxyCODONE-acetaminophen 5-325 MG tablet Commonly known as: PERCOCET/ROXICET Take 1-2 tablets by mouth every 4 (four) hours as needed for severe pain. What changed: You were already taking a medication with the same name, and this prescription was added. Make sure you understand how and when to take each.   Phillips Stool Softener 100 MG capsule Generic drug: docusate sodium Take 100 mg by mouth daily.   pregabalin 150 MG capsule Commonly known as: LYRICA TAKE 1 CAPSULE BY MOUTH EVERY MORNING, AFTERNOON AND EVENING.   promethazine 12.5 MG tablet Commonly known as: PHENERGAN Take 1 tablet (12.5 mg total) by mouth every 6 (six) hours as needed for nausea or vomiting.   propranolol ER 60 MG 24 hr capsule Commonly known as: INDERAL LA Take 1 capsule (60 mg total) by mouth at bedtime.   rizatriptan 5 MG tablet Commonly known as: MAXALT Take 5 mg by mouth as needed for migraine. May repeat in 2 hours if needed   solifenacin 5 MG tablet Commonly known as: VESICARE TAKE 1 TABLET(5 MG) BY MOUTH DAILY   trolamine salicylate 10 % cream Commonly known as: ASPERCREME Apply 1 Application topically as needed for muscle pain.   Vitamin D (Ergocalciferol) 1.25 MG (50000 UNIT) Caps capsule Commonly known as: DRISDOL TAKE 1 CAPSULE BY MOUTH EVERY 7 DAYS What changed: when to take this        Diagnostic Studies: DG Lumbar Spine 2-3 Views  Result Date: 02/18/2022 CLINICAL DATA:  Elective surgery. EXAM: LUMBAR SPINE - 2-3 VIEW COMPARISON:  Preoperative radiograph 12/21/2021 demonstrating 5 non-rib-bearing lumbar vertebra FINDINGS: Two fluoroscopic spot views in the operating room of the lumbar spine provided. The previous L4  through S1 fusion hardware has been extended to L3 on the left. There is no L3-L4 interbody spacer. Fluoroscopy time 1 minutes 58 seconds. Dose 97.28 mGy. IMPRESSION: Intraoperative fluoroscopy during lumbar fusion. Electronically Signed   By: Narda Rutherford M.D.   On: 02/18/2022 13:19   DG Lumbar Spine 1 View  Result Date: 02/18/2022 CLINICAL DATA:  Lumbar spine localization for posterior decompression and fusion of L3 and L4. EXAM: LUMBAR SPINE - 1 VIEW COMPARISON:  12/21/2021 FINDINGS: Again seen are bilateral pedicle screws and rods at L4 through S1. On the current exam there are surgical probes posterior to the L2 and L3 spinous processes. There is been interval placement of interbody spacer at L3 or. IMPRESSION: Surgical probes posterior to the L2 and L3 spinous processes with interval placement of spacer in the L3-4 disc space. Electronically Signed   By: Signa Kell  M.D.   On: 02/18/2022 12:25   DG C-Arm 1-60 Min-No Report  Result Date: 02/18/2022 Fluoroscopy was utilized by the requesting physician.  No radiographic interpretation.   DG C-Arm 1-60 Min-No Report  Result Date: 02/18/2022 Fluoroscopy was utilized by the requesting physician.  No radiographic interpretation.   DG C-Arm 1-60 Min-No Report  Result Date: 02/18/2022 Fluoroscopy was utilized by the requesting physician.  No radiographic interpretation.    Disposition: Discharge disposition: 01-Home or Self Care        POD #1 s/p L3/4 decompression and lateral/posterior fusion, doing well   - up with PT/OT, encourage ambulation - Percocet for pain, Robaxin for muscle spasms -Scripts for pain sent to pharmacy electronically  -D/C instructions sheet printed and in chart -D/C today  -F/U in office 2 weeks   Signed: Eilene Ghazi Kunta Hilleary 03/10/2022, 11:51 AM

## 2022-04-02 ENCOUNTER — Other Ambulatory Visit: Payer: Self-pay | Admitting: Medical

## 2022-04-02 DIAGNOSIS — M48062 Spinal stenosis, lumbar region with neurogenic claudication: Secondary | ICD-10-CM | POA: Diagnosis not present

## 2022-04-07 DIAGNOSIS — M48061 Spinal stenosis, lumbar region without neurogenic claudication: Secondary | ICD-10-CM | POA: Diagnosis not present

## 2022-04-07 DIAGNOSIS — Z981 Arthrodesis status: Secondary | ICD-10-CM | POA: Diagnosis not present

## 2022-04-16 ENCOUNTER — Other Ambulatory Visit: Payer: Self-pay | Admitting: Medical

## 2022-04-17 ENCOUNTER — Other Ambulatory Visit: Payer: Self-pay | Admitting: Medical

## 2022-04-19 NOTE — Telephone Encounter (Signed)
Vitamin D was normal when checked in June. Ok to refill?

## 2022-05-04 ENCOUNTER — Ambulatory Visit (INDEPENDENT_AMBULATORY_CARE_PROVIDER_SITE_OTHER): Payer: Medicare HMO

## 2022-05-04 VITALS — Ht 62.5 in | Wt 165.0 lb

## 2022-05-04 DIAGNOSIS — Z0000 Encounter for general adult medical examination without abnormal findings: Secondary | ICD-10-CM

## 2022-05-04 NOTE — Patient Instructions (Signed)
Desiree Thomas , Thank you for taking time to come for your Medicare Wellness Visit. I appreciate your ongoing commitment to your health goals. Please review the following plan we discussed and let me know if I can assist you in the future.   These are the goals we discussed:  Goals      Patient Stated     04/10/2021, wants to come off some medications and quit smoking     Patient Stated     05/04/2022, stay out of the hospital        This is a list of the screening recommended for you and due dates:  Health Maintenance  Topic Date Due   Zoster (Shingles) Vaccine (1 of 2) Never done   COVID-19 Vaccine (5 - 2023-24 season) 03/05/2022   Medicare Annual Wellness Visit  05/05/2023   Mammogram  01/22/2024   Colon Cancer Screening  12/29/2027   DTaP/Tdap/Td vaccine (2 - Tdap) 09/05/2030   Flu Shot  Completed   Hepatitis C Screening: USPSTF Recommendation to screen - Ages 18-79 yo.  Completed   HIV Screening  Completed   HPV Vaccine  Aged Out   Pap Smear  Discontinued    Advanced directives: Advance directive discussed with you today.   Conditions/risks identified: none  Next appointment: Follow up in one year for your annual wellness visit.   Preventive Care 40-64 Years, Female Preventive care refers to lifestyle choices and visits with your health care provider that can promote health and wellness. What does preventive care include? A yearly physical exam. This is also called an annual well check. Dental exams once or twice a year. Routine eye exams. Ask your health care provider how often you should have your eyes checked. Personal lifestyle choices, including: Daily care of your teeth and gums. Regular physical activity. Eating a healthy diet. Avoiding tobacco and drug use. Limiting alcohol use. Practicing safe sex. Taking low-dose aspirin daily starting at age 108. Taking vitamin and mineral supplements as recommended by your health care provider. What happens during an  annual well check? The services and screenings done by your health care provider during your annual well check will depend on your age, overall health, lifestyle risk factors, and family history of disease. Counseling  Your health care provider may ask you questions about your: Alcohol use. Tobacco use. Drug use. Emotional well-being. Home and relationship well-being. Sexual activity. Eating habits. Work and work Statistician. Method of birth control. Menstrual cycle. Pregnancy history. Screening  You may have the following tests or measurements: Height, weight, and BMI. Blood pressure. Lipid and cholesterol levels. These may be checked every 5 years, or more frequently if you are over 32 years old. Skin check. Lung cancer screening. You may have this screening every year starting at age 72 if you have a 30-pack-year history of smoking and currently smoke or have quit within the past 15 years. Fecal occult blood test (FOBT) of the stool. You may have this test every year starting at age 66. Flexible sigmoidoscopy or colonoscopy. You may have a sigmoidoscopy every 5 years or a colonoscopy every 10 years starting at age 34. Hepatitis C blood test. Hepatitis B blood test. Sexually transmitted disease (STD) testing. Diabetes screening. This is done by checking your blood sugar (glucose) after you have not eaten for a while (fasting). You may have this done every 1-3 years. Mammogram. This may be done every 1-2 years. Talk to your health care provider about when you should start having  regular mammograms. This may depend on whether you have a family history of breast cancer. BRCA-related cancer screening. This may be done if you have a family history of breast, ovarian, tubal, or peritoneal cancers. Pelvic exam and Pap test. This may be done every 3 years starting at age 52. Starting at age 60, this may be done every 5 years if you have a Pap test in combination with an HPV test. Bone  density scan. This is done to screen for osteoporosis. You may have this scan if you are at high risk for osteoporosis. Discuss your test results, treatment options, and if necessary, the need for more tests with your health care provider. Vaccines  Your health care provider may recommend certain vaccines, such as: Influenza vaccine. This is recommended every year. Tetanus, diphtheria, and acellular pertussis (Tdap, Td) vaccine. You may need a Td booster every 10 years. Zoster vaccine. You may need this after age 73. Pneumococcal 13-valent conjugate (PCV13) vaccine. You may need this if you have certain conditions and were not previously vaccinated. Pneumococcal polysaccharide (PPSV23) vaccine. You may need one or two doses if you smoke cigarettes or if you have certain conditions. Talk to your health care provider about which screenings and vaccines you need and how often you need them. This information is not intended to replace advice given to you by your health care provider. Make sure you discuss any questions you have with your health care provider. Document Released: 03/28/2015 Document Revised: 11/19/2015 Document Reviewed: 12/31/2014 Elsevier Interactive Patient Education  2017 [place] Prevention in the Home Falls can cause injuries. They can happen to people of all ages. There are many things you can do to make your home safe and to help prevent falls. What can I do on the outside of my home? Regularly fix the edges of walkways and driveways and fix any cracks. Remove anything that might make you trip as you walk through a door, such as a raised step or threshold. Trim any bushes or trees on the path to your home. Use bright outdoor lighting. Clear any walking paths of anything that might make someone trip, such as rocks or tools. Regularly check to see if handrails are loose or broken. Make sure that both sides of any steps have handrails. Any raised decks and  porches should have guardrails on the edges. Have any leaves, snow, or ice cleared regularly. Use sand or salt on walking paths during winter. Clean up any spills in your garage right away. This includes oil or grease spills. What can I do in the bathroom? Use night lights. Install grab bars by the toilet and in the tub and shower. Do not use towel bars as grab bars. Use non-skid mats or decals in the tub or shower. If you need to sit down in the shower, use a plastic, non-slip stool. Keep the floor dry. Clean up any water that spills on the floor as soon as it happens. Remove soap buildup in the tub or shower regularly. Attach bath mats securely with double-sided non-slip rug tape. Do not have throw rugs and other things on the floor that can make you trip. What can I do in the bedroom? Use night lights. Make sure that you have a light by your bed that is easy to reach. Do not use any sheets or blankets that are too big for your bed. They should not hang down onto the floor. Have a firm chair that has  side arms. You can use this for support while you get dressed. Do not have throw rugs and other things on the floor that can make you trip. What can I do in the kitchen? Clean up any spills right away. Avoid walking on wet floors. Keep items that you use a lot in easy-to-reach places. If you need to reach something above you, use a strong step stool that has a grab bar. Keep electrical cords out of the way. Do not use floor polish or wax that makes floors slippery. If you must use wax, use non-skid floor wax. Do not have throw rugs and other things on the floor that can make you trip. What can I do with my stairs? Do not leave any items on the stairs. Make sure that there are handrails on both sides of the stairs and use them. Fix handrails that are broken or loose. Make sure that handrails are as long as the stairways. Check any carpeting to make sure that it is firmly attached to the  stairs. Fix any carpet that is loose or worn. Avoid having throw rugs at the top or bottom of the stairs. If you do have throw rugs, attach them to the floor with carpet tape. Make sure that you have a light switch at the top of the stairs and the bottom of the stairs. If you do not have them, ask someone to add them for you. What else can I do to help prevent falls? Wear shoes that: Do not have high heels. Have rubber bottoms. Are comfortable and fit you well. Are closed at the toe. Do not wear sandals. If you use a stepladder: Make sure that it is fully opened. Do not climb a closed stepladder. Make sure that both sides of the stepladder are locked into place. Ask someone to hold it for you, if possible. Clearly mark and make sure that you can see: Any grab bars or handrails. First and last steps. Where the edge of each step is. Use tools that help you move around (mobility aids) if they are needed. These include: Canes. Walkers. Scooters. Crutches. Turn on the lights when you go into a dark area. Replace any light bulbs as soon as they burn out. Set up your furniture so you have a clear path. Avoid moving your furniture around. If any of your floors are uneven, fix them. If there are any pets around you, be aware of where they are. Review your medicines with your doctor. Some medicines can make you feel dizzy. This can increase your chance of falling. Ask your doctor what other things that you can do to help prevent falls. This information is not intended to replace advice given to you by your health care provider. Make sure you discuss any questions you have with your health care provider. Document Released: 12/26/2008 Document Revised: 08/07/2015 Document Reviewed: 04/05/2014 Elsevier Interactive Patient Education  2017 [employer].

## 2022-05-04 NOTE — Progress Notes (Signed)
I connected with  Vegas Rittner on 05/04/22 by a audio enabled telemedicine application and verified that I am speaking with the correct person using two identifiers.  Patient Location: Home  Provider Location: Office/Clinic  I discussed the limitations of evaluation and management by telemedicine. The patient expressed understanding and agreed to proceed.  Subjective:   Desiree Thomas is a 62 y.o. female who presents for Medicare Annual (Subsequent) preventive examination.  Review of Systems     Cardiac Risk Factors include: advanced age (>25mn, >>37women);dyslipidemia;hypertension     Objective:    Today's Vitals   05/04/22 1156  Weight: 165 lb (74.8 kg)  Height: 5' 2.5" (1.588 m)   Body mass index is 29.7 kg/m.     05/04/2022   12:03 PM 02/18/2022    6:51 AM 02/08/2022   11:22 AM 04/10/2021    8:30 AM  Advanced Directives  Does Patient Have a Medical Advance Directive? No No No No  Would patient like information on creating a medical advance directive?  No - Patient declined Yes (MAU/Ambulatory/Procedural Areas - Information given) Yes (MAU/Ambulatory/Procedural Areas - Information given)    Current Medications (verified) Outpatient Encounter Medications as of 05/04/2022  Medication Sig   amLODipine (NORVASC) 10 MG tablet TAKE 1 TABLET(10 MG) BY MOUTH DAILY   atorvastatin (LIPITOR) 40 MG tablet TAKE 1 TABLET(40 MG) BY MOUTH DAILY   diphenhydrAMINE (BENADRYL) 25 MG tablet Take 50 mg by mouth every 6 (six) hours as needed for allergies. Takes with Percocet   FLUoxetine (PROZAC) 20 MG capsule TAKE 1 CAPSULE(20 MG) BY MOUTH DAILY   loratadine (CLARITIN) 10 MG tablet Take 10 mg by mouth daily.   magnesium gluconate (MAGONATE) 500 MG tablet Take 500 mg by mouth daily.   methocarbamol (ROBAXIN) 750 MG tablet Take 750 mg by mouth in the morning and at bedtime.   ondansetron (ZOFRAN-ODT) 8 MG disintegrating tablet Take 8 mg by mouth every 8 (eight) hours as needed for nausea or  vomiting.   pregabalin (LYRICA) 150 MG capsule TAKE 1 CAPSULE BY MOUTH EVERY MORNING, 1 CAPSULE EVERY AFTERNOON, AND 1 CAPSULE EVERY EVENING   propranolol ER (INDERAL LA) 60 MG 24 hr capsule Take 1 capsule (60 mg total) by mouth at bedtime.   QUEtiapine (SEROQUEL) 25 MG tablet TAKE 1 TABLET(25 MG) BY MOUTH AT BEDTIME AS NEEDED FOR SLEEP   rizatriptan (MAXALT) 5 MG tablet Take 5 mg by mouth as needed for migraine. May repeat in 2 hours if needed   solifenacin (VESICARE) 5 MG tablet TAKE 1 TABLET(5 MG) BY MOUTH DAILY   traZODone (DESYREL) 50 MG tablet TAKE 1 TABLET(50 MG) BY MOUTH AT BEDTIME   trolamine salicylate (ASPERCREME) 10 % cream Apply 1 Application topically as needed for muscle pain.   Vitamin D, Ergocalciferol, (DRISDOL) 1.25 MG (50000 UNIT) CAPS capsule TAKE 1 CAPSULE BY MOUTH EVERY 7 DAYS   docusate sodium (PHILLIPS STOOL SOFTENER) 100 MG capsule Take 100 mg by mouth daily. (Patient not taking: Reported on 05/04/2022)   methocarbamol (ROBAXIN) 750 MG tablet Take 1 tablet (750 mg total) by mouth 4 (four) times daily.   oxyCODONE-acetaminophen (PERCOCET/ROXICET) 5-325 MG tablet Take 1 tablet by mouth 2 (two) times daily as needed for severe pain. (Patient not taking: Reported on 05/04/2022)   oxyCODONE-acetaminophen (PERCOCET/ROXICET) 5-325 MG tablet Take 1-2 tablets by mouth every 4 (four) hours as needed for severe pain. (Patient not taking: Reported on 05/04/2022)   promethazine (PHENERGAN) 12.5 MG tablet Take 1 tablet (12.5  mg total) by mouth every 6 (six) hours as needed for nausea or vomiting. (Patient not taking: Reported on 02/01/2022)   No facility-administered encounter medications on file as of 05/04/2022.    Allergies (verified) Morphine and related, Penicillins, Percocet [oxycodone-acetaminophen], Topamax [topiramate], and Vicodin hp [hydrocodone-acetaminophen]   History: Past Medical History:  Diagnosis Date   Allergy    Arthritis    Asthma    no inhaler - as a young  adult, no current problems   Chronic back pain    Depression    GERD (gastroesophageal reflux disease)    no meds, diet controlled   Hearing loss    45% on right ear   History of meningitis    1992   Hyperlipidemia    Hypertension    Migraine    Past Surgical History:  Procedure Laterality Date   ABDOMINAL HYSTERECTOMY     still has ovaries   ANTERIOR LAT LUMBAR FUSION Left 02/18/2022   Procedure: LEFT-SIDED LUMBAR 3 - LUMBAR 4 LATERAL INTERBODY FUSION WITH INSTRUMENTATION AND ALLOGRAFT;  Surgeon: Phylliss Bob, MD;  Location: [facility];  Service: Orthopedics;  Laterality: Left;   APPENDECTOMY     CESAREAN SECTION     x2   COLONOSCOPY  2019   cyst removed from ankle     KNEE SURGERY  02/2013   KNEE SURGERY Bilateral    arthroscopy   LUMBAR FUSION  02/2014   LUMBAR SPINE SURGERY     fusion   trigger finger of thumb Bilateral    TRIGGER FINGER RELEASE Right 09/21/2021   Procedure: RIGHT RING AND SMALL TRIGGER FINGER RELEASE and INJECTIONS OF RIGHT INDEX AND LONG FINGER;  Surgeon: Georgeanna Harrison, MD;  Location: [facility];  Service: Orthopedics;  Laterality: Right;   Family History  Problem Relation Age of Onset   Arthritis Mother    Heart disease Mother    Asthma Mother    Diverticulitis Mother    Migraines Mother    Diabetes Father    Diverticulitis Father    Arthritis Sister    Asthma Sister    Migraines Sister    Arthritis Sister    Asthma Sister    Cancer Maternal Grandmother        stomach   Diabetes Paternal Grandmother    Dementia Paternal Grandmother    Migraines Daughter    Social History   Socioeconomic History   Marital status: Married    Spouse name: Not on file   Number of children: Not on file   Years of education: Not on file   Highest education level: Not on file  Occupational History   Not on file  Tobacco Use   Smoking status: Former    Packs/day: 0.25    Years: 11.00    Total pack years: 2.75    Types: Cigarettes    Quit date: 02/17/2022     Years since quitting: 0.2   Smokeless tobacco: Never   Tobacco comments:    Since 2012  Vaping Use   Vaping Use: Former   Substances: Nicotine, Flavoring  Substance and Sexual Activity   Alcohol use: Not Currently    Comment: seldom- every 5-6 months will have a drink   Drug use: Yes    Frequency: 2.0 times per week    Types: Marijuana    Comment: a blunt per day, states it helps with her pain   Sexual activity: Yes    Birth control/protection: Surgical    Comment: hysterectomy  Other  Topics Concern   Not on file  Social History Narrative   Married, Christian.  Disabled.  Has 2 children .  08/2020      Caffeine: 1 cup coffee/day, 1/2 pepsi per day, maybe some tea   Social Determinants of Health   Financial Resource Strain: Low Risk  (05/04/2022)   Overall Financial Resource Strain (CARDIA)    Difficulty of Paying Living Expenses: Not hard at all  Food Insecurity: No Food Insecurity (05/04/2022)   Hunger Vital Sign    Worried About Running Out of Food in the Last Year: Never true    Ran Out of Food in the Last Year: Never true  Transportation Needs: No Transportation Needs (05/04/2022)   PRAPARE - Hydrologist (Medical): No    Lack of Transportation (Non-Medical): No  Physical Activity: Inactive (05/04/2022)   Exercise Vital Sign    Days of Exercise per Week: 0 days    Minutes of Exercise per Session: 0 min  Stress: No Stress Concern Present (05/04/2022)   [place]    Feeling of Stress : Not at all  Social Connections: Not on file    Tobacco Counseling Counseling given: Not Answered Tobacco comments: Since 2012   Clinical Intake:  Pre-visit preparation completed: Yes  Pain : No/denies pain     Nutritional Status: BMI 25 -29 Overweight Nutritional Risks: None Diabetes: No  How often do you need to have someone help you when you read instructions, pamphlets, or other  written materials from your doctor or pharmacy?: 1 - Never  Diabetic? no  Interpreter Needed?: No  Information entered by :: NAllen LPN   Activities of Daily Living    05/04/2022   12:06 PM 02/08/2022   11:24 AM  In your present state of health, do you have any difficulty performing the following activities:  Hearing? 0   Vision? 0   Difficulty concentrating or making decisions? 0   Walking or climbing stairs? 1   Dressing or bathing? 0   Doing errands, shopping? 0 0  Preparing Food and eating ? N   Using the Toilet? N   In the past six months, have you accidently leaked urine? N   Do you have problems with loss of bowel control? N   Managing your Medications? N   Managing your Finances? N   Housekeeping or managing your Housekeeping? N     Patient Care Team: Tysinger, Camelia Eng, PA-C as PCP - General (Family Medicine) Phylliss Bob, MD as Consulting Physician (Orthopedic Surgery)  Indicate any recent Medical Services you may have received from other than [facility] providers in the past year (date may be approximate).     Assessment:   This is a routine wellness examination for [place].  Hearing/Vision screen Vision Screening - Comments:: Regular eye exams, [facility]  Dietary issues and exercise activities discussed: Current Exercise Habits: The patient does not participate in regular exercise at present   Goals Addressed             This Visit's Progress    Patient Stated       05/04/2022, stay out of the hospital       Depression Screen    05/04/2022   12:05 PM 12/09/2021    8:41 AM 09/07/2021   11:39 AM 04/10/2021    8:34 AM 02/17/2021    8:33 AM 09/04/2020    1:57 PM 06/12/2020  2:03 PM  PHQ 2/9 Scores  PHQ - 2 Score 0 0 0 0 0 0 0  PHQ- 9 Score 0 0 0  1 0     Fall Risk    05/04/2022   12:04 PM 12/09/2021    8:41 AM 04/10/2021    8:32 AM 02/17/2021    8:33 AM  Fall Risk   Falls in the past year? 1 1 1 1  $ Comment legs gave out  tripped going  upstairs   Number falls in past yr: 1 0 1 1  Injury with Fall? 0 1 0 0  Risk for fall due to : History of fall(s);Medication side effect Impaired balance/gait Impaired balance/gait;Medication side effect Impaired balance/gait  Follow up Falls prevention discussed;Education provided;Falls evaluation completed Falls evaluation completed Falls evaluation completed;Education provided;Falls prevention discussed Falls evaluation completed    FALL RISK PREVENTION PERTAINING TO THE HOME:  Any stairs in or around the home? Yes  If so, are there any without handrails? No  Home free of loose throw rugs in walkways, pet beds, electrical cords, etc? Yes  Adequate lighting in your home to reduce risk of falls? Yes   ASSISTIVE DEVICES UTILIZED TO PREVENT FALLS:  Life alert? No  Use of a cane, walker or w/c? No  Grab bars in the bathroom? No  Shower chair or bench in shower? Yes  Elevated toilet seat or a handicapped toilet? Yes   TIMED UP AND GO:  Was the test performed? No .       Cognitive Function:        05/04/2022   12:07 PM 04/10/2021    8:37 AM  6CIT Screen  What Year? 0 points 0 points  What month? 0 points 0 points  What time? 0 points 0 points  Count back from 20 0 points 0 points  Months in reverse 0 points 0 points  Repeat phrase 2 points 6 points  Total Score 2 points 6 points    Immunizations Immunization History  Administered Date(s) Administered   COVID-19, mRNA, vaccine(Comirnaty)12 years and older 01/08/2022   Influenza Split 12/24/2020   Influenza,inj,Quad PF,6+ Mos 12/09/2021   PFIZER(Purple Top)SARS-COV-2 Vaccination 06/12/2019, 07/03/2019, 07/08/2020   Pneumococcal Polysaccharide-23 12/24/2020   Td 09/04/2020    TDAP status: Up to date  Flu Vaccine status: Up to date  Pneumococcal vaccine status: Up to date  Covid-19 vaccine status: Completed vaccines  Qualifies for Shingles Vaccine? Yes   Zostavax completed No   Shingrix Completed?:  Yes  Screening Tests Health Maintenance  Topic Date Due   Zoster Vaccines- Shingrix (1 of 2) Never done   COVID-19 Vaccine (5 - 2023-24 season) 03/05/2022   Medicare Annual Wellness (AWV)  04/10/2022   MAMMOGRAM  01/22/2024   COLONOSCOPY (Pts 45-20yr Insurance coverage will need to be confirmed)  12/29/2027   DTaP/Tdap/Td (2 - Tdap) 09/05/2030   INFLUENZA VACCINE  Completed   Hepatitis C Screening  Completed   HIV Screening  Completed   HPV VACCINES  Aged Out   PAP SMEAR-Modifier  Discontinued    Health Maintenance  Health Maintenance Due  Topic Date Due   Zoster Vaccines- Shingrix (1 of 2) Never done   COVID-19 Vaccine (5 - 2023-24 season) 03/05/2022   Medicare Annual Wellness (AWV)  04/10/2022    Colorectal cancer screening: Type of screening: Colonoscopy. Completed 12/28/2017. Repeat every 10 years  Mammogram status: Completed 01/21/2022. Repeat every year  Bone Density status: n/a  Lung Cancer Screening: (Low Dose CT  Chest recommended if Age 51-80 years, 30 pack-year currently smoking OR have quit w/in 15years.) does not qualify.   Lung Cancer Screening Referral: no  Additional Screening:  Hepatitis C Screening: does qualify; Completed 09/04/2020  Vision Screening: Recommended annual ophthalmology exams for early detection of glaucoma and other disorders of the eye. Is the patient up to date with their annual eye exam?  Yes  Who is the provider or what is the name of the office in which the patient attends annual eye exams? [facility] If pt is not established with a provider, would they like to be referred to a provider to establish care? No .   Dental Screening: Recommended annual dental exams for proper oral hygiene  Community Resource Referral / Chronic Care Management: CRR required this visit?  No   CCM required this visit?  No      Plan:     I have personally reviewed and noted the following in the patient's chart:   Medical and social  history Use of alcohol, tobacco or illicit drugs  Current medications and supplements including opioid prescriptions. Patient is not currently taking opioid prescriptions. Functional ability and status Nutritional status Physical activity Advanced directives List of other physicians Hospitalizations, surgeries, and ER visits in previous 12 months Vitals Screenings to include cognitive, depression, and falls Referrals and appointments  In addition, I have reviewed and discussed with patient certain preventive protocols, quality metrics, and best practice recommendations. A written personalized care plan for preventive services as well as general preventive health recommendations were provided to patient.     Kellie Simmering, LPN   [id number]   Nurse Notes: none  Due to this being a virtual visit, the after visit summary with patients personalized plan was offered to patient via mail or my-chart. Patient would like to access on my-chart

## 2022-05-09 ENCOUNTER — Other Ambulatory Visit: Payer: Self-pay | Admitting: Medical

## 2022-05-10 DIAGNOSIS — M48062 Spinal stenosis, lumbar region with neurogenic claudication: Secondary | ICD-10-CM | POA: Diagnosis not present

## 2022-05-10 NOTE — Telephone Encounter (Signed)
Pt called for refills of lyrica. She takes 3 aday and has been out of 2 days. Please refill ASAP.

## 2022-05-13 ENCOUNTER — Other Ambulatory Visit: Payer: Self-pay | Admitting: Medical

## 2022-05-13 MED ORDER — PREGABALIN 150 MG PO CAPS: ORAL_CAPSULE | ORAL | 4 refills | Status: DC

## 2022-05-16 ENCOUNTER — Other Ambulatory Visit: Payer: Self-pay | Admitting: Medical

## 2022-06-02 DIAGNOSIS — H524 Presbyopia: Secondary | ICD-10-CM | POA: Diagnosis not present

## 2022-06-05 ENCOUNTER — Other Ambulatory Visit: Payer: Self-pay | Admitting: Medical

## 2022-07-01 DIAGNOSIS — M65321 Trigger finger, right index finger: Secondary | ICD-10-CM | POA: Diagnosis not present

## 2022-07-06 DIAGNOSIS — M4326 Fusion of spine, lumbar region: Secondary | ICD-10-CM | POA: Diagnosis not present

## 2022-07-10 ENCOUNTER — Other Ambulatory Visit: Payer: Self-pay | Admitting: Medical

## 2022-07-12 DIAGNOSIS — M4326 Fusion of spine, lumbar region: Secondary | ICD-10-CM | POA: Diagnosis not present

## 2022-07-15 ENCOUNTER — Other Ambulatory Visit: Payer: Self-pay | Admitting: Medical

## 2022-07-22 DIAGNOSIS — M4326 Fusion of spine, lumbar region: Secondary | ICD-10-CM | POA: Diagnosis not present

## 2022-07-26 IMAGING — MG MM DIGITAL SCREENING BILAT W/ TOMO AND CAD
8 series · 8 of 24 positions shown · non-contrast
Comparison: None.

ACR Breast Density Category a: The breast tissue is almost entirely
fatty.

CLINICAL DATA: Screening.

EXAM:
DIGITAL SCREENING BILATERAL MAMMOGRAM WITH TOMOSYNTHESIS AND CAD
TECHNIQUE: Bilateral screening digital craniocaudal and mediolateral oblique
mammograms were obtained. Bilateral screening digital breast
tomosynthesis was performed. The images were evaluated with
computer-aided detection.

[R MLO synth-2D]
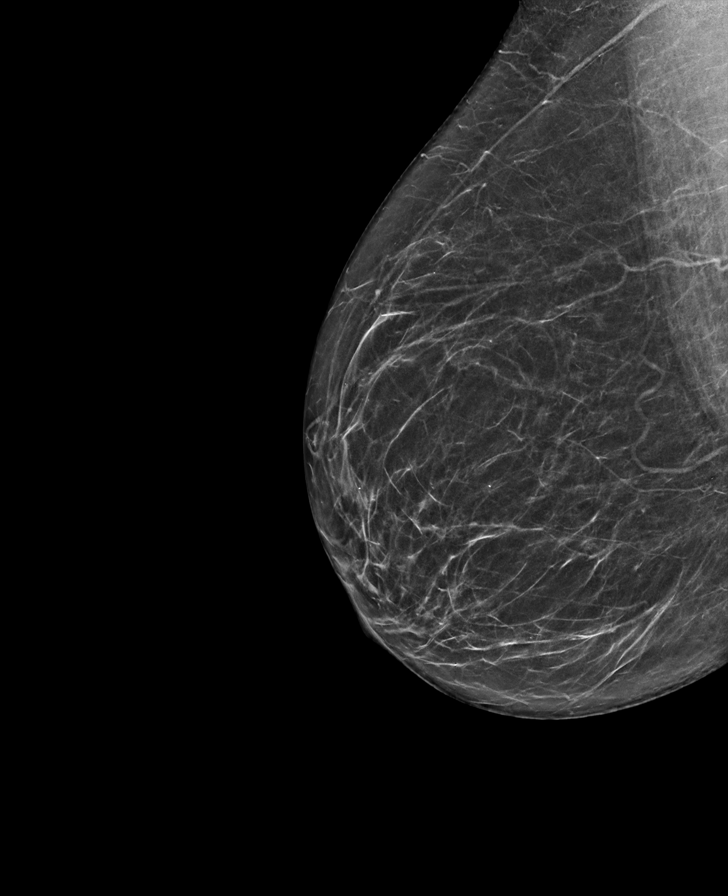

[L CC synth-2D]
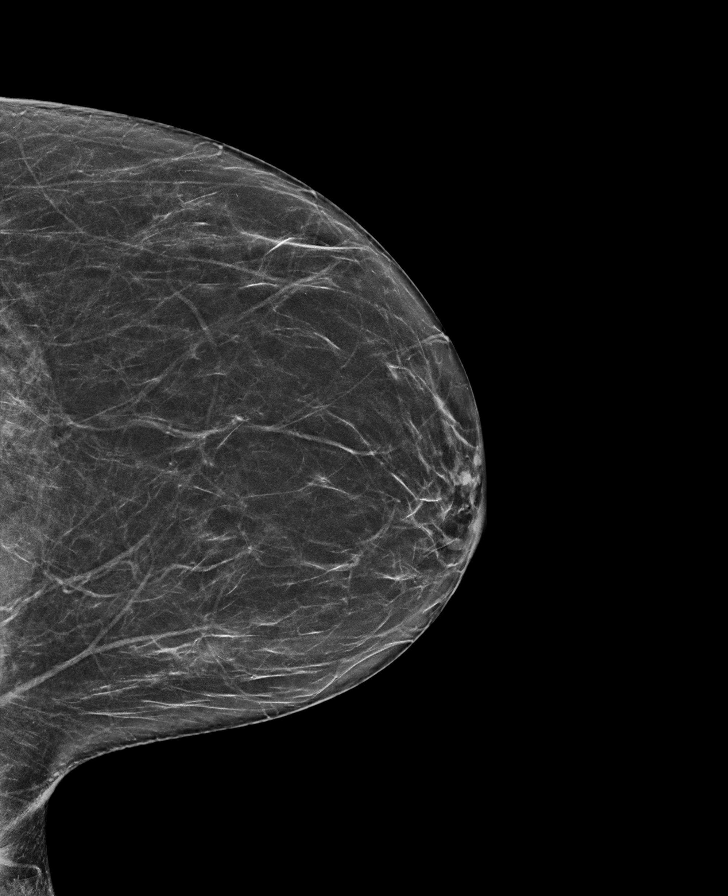

[R CC synth-2D]
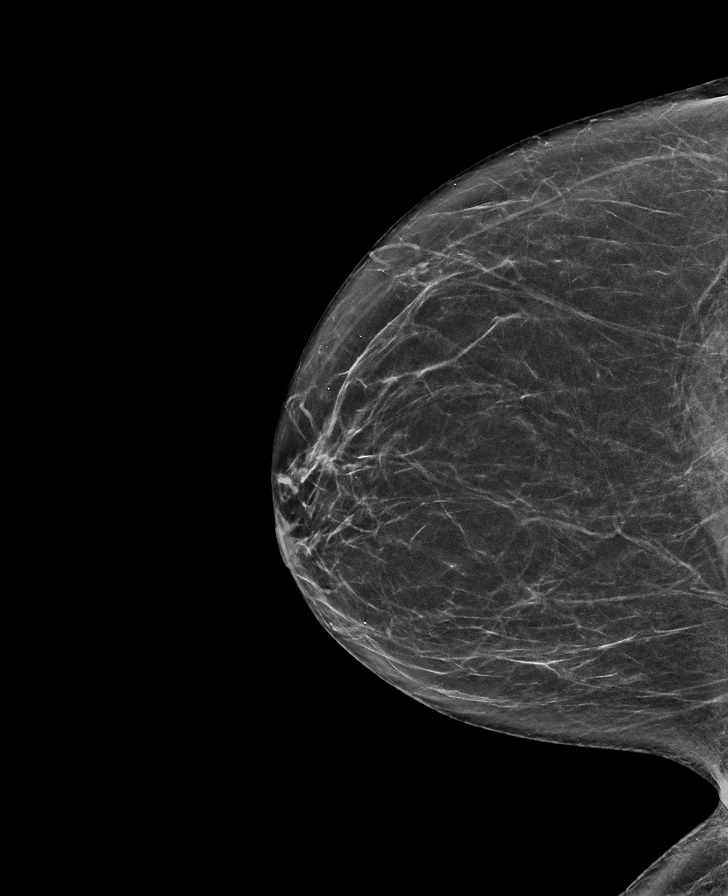

[L MLO synth-2D]
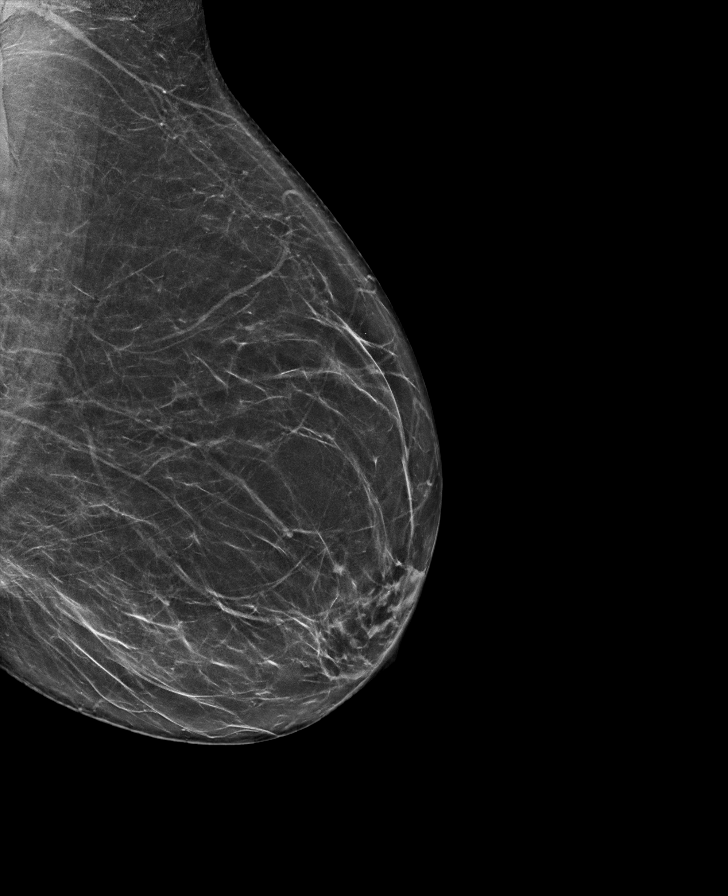

[R CC tomo · tomo slice 33/65.0]
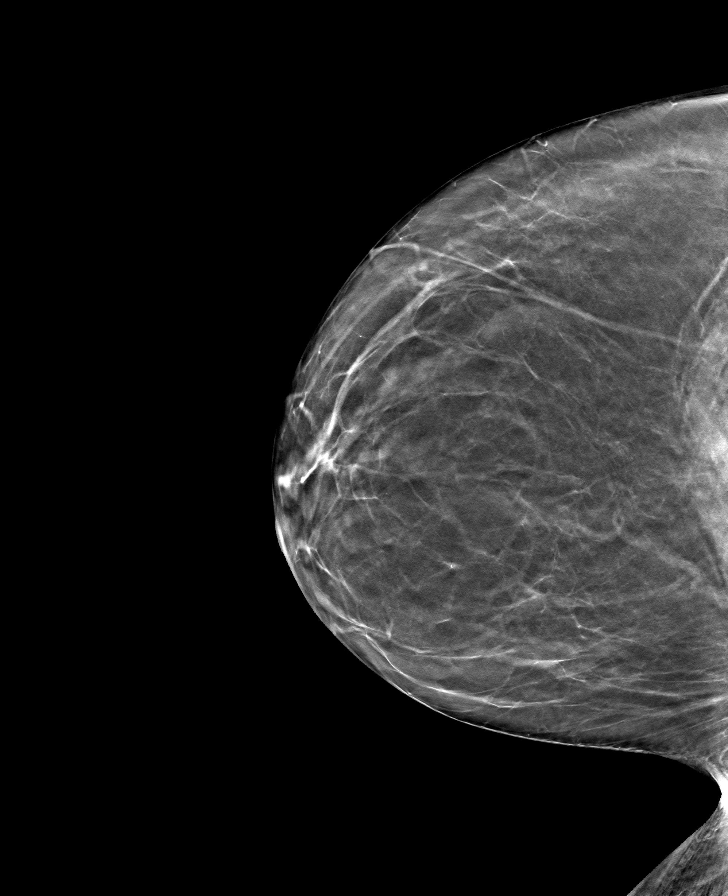

[L CC tomo · tomo slice 33/65.0]
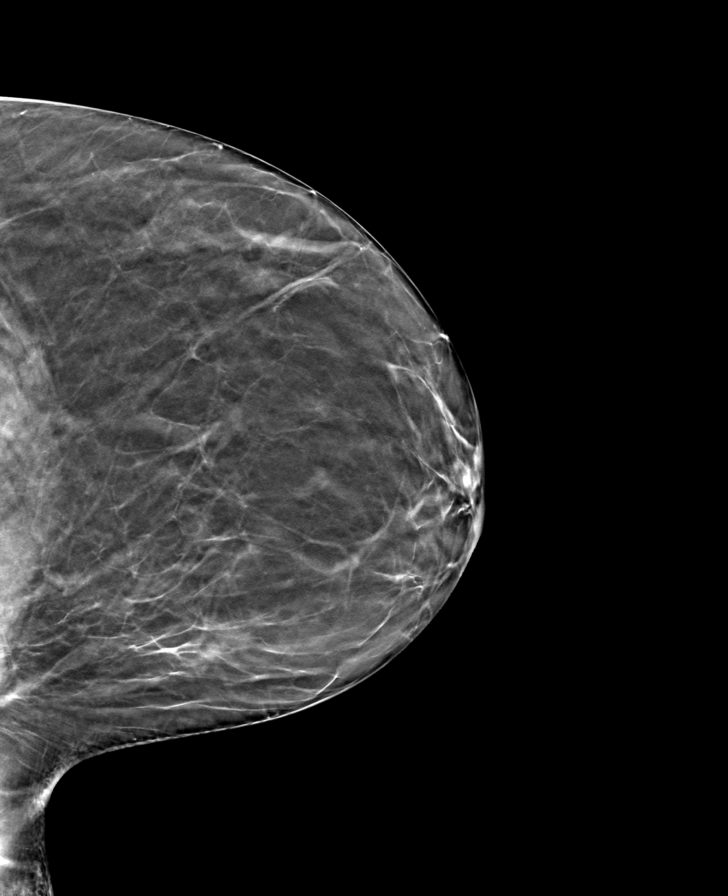

[R MLO tomo · tomo slice 33/65.0]
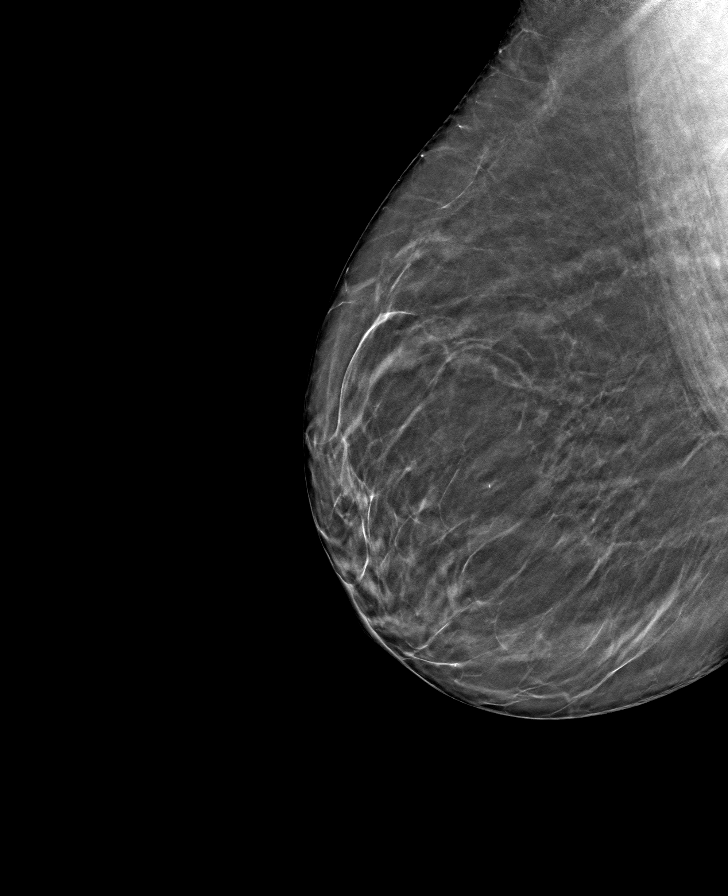

[L MLO tomo · tomo slice 36/71.0]
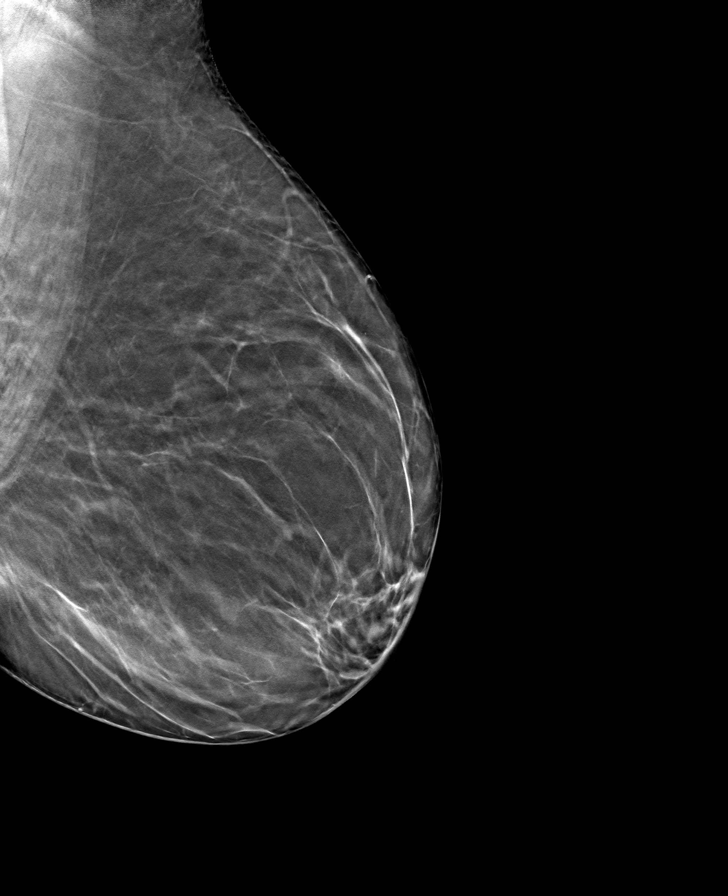

[8 of 24 positions shown; findings below may reference images not displayed]

FINDINGS: There are no findings suspicious for malignancy. The images were
evaluated with computer-aided detection.
IMPRESSION: No mammographic evidence of malignancy. A result letter of this
screening mammogram will be mailed directly to the patient.

RECOMMENDATION:
Screening mammogram in one year. (Code:[id number])

BI-RADS CATEGORY  1: Negative.

## 2022-07-29 DIAGNOSIS — M4326 Fusion of spine, lumbar region: Secondary | ICD-10-CM | POA: Diagnosis not present

## 2022-08-03 DIAGNOSIS — M65321 Trigger finger, right index finger: Secondary | ICD-10-CM | POA: Diagnosis not present

## 2022-08-05 DIAGNOSIS — M4326 Fusion of spine, lumbar region: Secondary | ICD-10-CM | POA: Diagnosis not present

## 2022-08-11 ENCOUNTER — Other Ambulatory Visit: Payer: Self-pay | Admitting: Medical

## 2022-08-11 ENCOUNTER — Telehealth: Payer: Self-pay | Admitting: Medical

## 2022-08-11 NOTE — Telephone Encounter (Signed)
Maclovia called and states she is no longer under the care of a back surgeon and she is asking if you can refill her Robaxin. She uses  [facility] [phone] - [place], [place] - [address]

## 2022-08-13 NOTE — Telephone Encounter (Signed)
Called and left vm for pt to call back with information from previous message and schedule med check or cpe per shane

## 2022-08-16 ENCOUNTER — Other Ambulatory Visit: Payer: Self-pay | Admitting: Medical

## 2022-08-16 ENCOUNTER — Telehealth: Payer: Self-pay | Admitting: Family Medicine

## 2022-08-16 MED ORDER — METHOCARBAMOL 750 MG PO TABS: 750.0000 mg | ORAL_TABLET | Freq: Two times a day (BID) | ORAL | 1 refills | Status: DC | PRN

## 2022-08-16 NOTE — Telephone Encounter (Signed)
Pt advised she is taking 1 am and 1 pm for Robaxin 750 mg

## 2022-08-17 ENCOUNTER — Ambulatory Visit (INDEPENDENT_AMBULATORY_CARE_PROVIDER_SITE_OTHER): Payer: Medicare HMO | Admitting: Medical

## 2022-08-17 ENCOUNTER — Encounter: Payer: Self-pay | Admitting: Medical

## 2022-08-17 VITALS — BP 122/70 | HR 61 | Temp 97.7°F | Ht 62.5 in | Wt 172.2 lb

## 2022-08-17 DIAGNOSIS — R3915 Urgency of urination: Secondary | ICD-10-CM | POA: Diagnosis not present

## 2022-08-17 DIAGNOSIS — R350 Frequency of micturition: Secondary | ICD-10-CM | POA: Diagnosis not present

## 2022-08-17 LAB — POCT URINALYSIS DIP (PROADVANTAGE DEVICE)
Bilirubin, UA: NEGATIVE
Blood, UA: NEGATIVE
Glucose, UA: NEGATIVE mg/dL
Ketones, POC UA: NEGATIVE mg/dL
Leukocytes, UA: NEGATIVE
Nitrite, UA: NEGATIVE
Protein Ur, POC: NEGATIVE mg/dL
Specific Gravity, Urine: 1.01
Urobilinogen, Ur: 0.2
pH, UA: 7.5 (ref 5.0–8.0)

## 2022-08-17 MED ORDER — SULFAMETHOXAZOLE-TRIMETHOPRIM 800-160 MG PO TABS: 1.0000 | ORAL_TABLET | Freq: Two times a day (BID) | ORAL | 0 refills | Status: DC

## 2022-08-17 NOTE — Progress Notes (Signed)
Subjective:   Desiree Thomas is a 62 y.o. female who complains of possible urinary tract infection.   Chief Complaint  Patient presents with   Urinary Frequency    Began about 10 days ago. Frequency and urgency. She did wet the bed one time. No burning or pain.    Symptoms began 10 days ago.  Symptoms include urinary urgency, frequency.  No blood in urine, no fever, no body aches or chills, no NVD.   Urine a little cloudy.   Had 1 episode of incontinence.   Mild itch.  No major pain.   last UTI was at least 2 years ago.   Using azo for current symptoms.    Patient does not have a history of recurrent UTI.   Patient does not have a history of pyelonephritis.    They do note recent change in soap or hygiene products.   They deny discharge or suspected genital infection.  No other aggravating or relieving factors.  No other c/o.  Past Medical History:  Diagnosis Date   Allergy    Arthritis    Asthma    no inhaler - as a young adult, no current problems   Chronic back pain    Depression    GERD (gastroesophageal reflux disease)    no meds, diet controlled   Hearing loss    45% on right ear   History of meningitis    1992   Hyperlipidemia    Hypertension    Migraine     Current Outpatient Medications on File Prior to Visit  Medication Sig Dispense Refill   amLODipine (NORVASC) 10 MG tablet TAKE 1 TABLET(10 MG) BY MOUTH DAILY 90 tablet 1   atorvastatin (LIPITOR) 40 MG tablet TAKE 1 TABLET(40 MG) BY MOUTH DAILY 90 tablet 0   FLUoxetine (PROZAC) 20 MG capsule TAKE 1 CAPSULE(20 MG) BY MOUTH DAILY 30 capsule 3   loratadine (CLARITIN) 10 MG tablet Take 10 mg by mouth daily.     magnesium gluconate (MAGONATE) 500 MG tablet Take 500 mg by mouth daily.     methocarbamol (ROBAXIN) 750 MG tablet Take 1 tablet (750 mg total) by mouth 2 (two) times daily as needed for muscle spasms. 60 tablet 1   Multiple Vitamins-Minerals (MULTIVITAMIN WITH MINERALS) tablet Take 1 tablet by mouth daily.      pregabalin (LYRICA) 150 MG capsule TAKE 1 CAPSULE BY MOUTH EVERY MORNING AND EVERY AFTERNOON AND EVERY EVENING 90 capsule 4   propranolol ER (INDERAL LA) 60 MG 24 hr capsule Take 1 capsule (60 mg total) by mouth at bedtime. 90 capsule 1   solifenacin (VESICARE) 5 MG tablet TAKE 1 TABLET(5 MG) BY MOUTH DAILY 90 tablet 2   Vitamin D, Ergocalciferol, (DRISDOL) 1.25 MG (50000 UNIT) CAPS capsule TAKE 1 CAPSULE BY MOUTH EVERY 7 DAYS 12 capsule 0   diphenhydrAMINE (BENADRYL) 25 MG tablet Take 50 mg by mouth every 6 (six) hours as needed for allergies. Takes with Percocet (Patient not taking: Reported on 08/17/2022)     ondansetron (ZOFRAN-ODT) 8 MG disintegrating tablet Take 8 mg by mouth every 8 (eight) hours as needed for nausea or vomiting. (Patient not taking: Reported on 08/17/2022)     QUEtiapine (SEROQUEL) 25 MG tablet TAKE 1 TABLET(25 MG) BY MOUTH AT BEDTIME AS NEEDED FOR SLEEP (Patient not taking: Reported on 08/17/2022) 90 tablet 0   rizatriptan (MAXALT) 5 MG tablet Take 5 mg by mouth as needed for migraine. May repeat in 2 hours if needed (  Patient not taking: Reported on 08/17/2022)     trolamine salicylate (ASPERCREME) 10 % cream Apply 1 Application topically as needed for muscle pain. (Patient not taking: Reported on 08/17/2022)     No current facility-administered medications on file prior to visit.    ROS as in subjective  Reviewed allergies, medications, past medical, surgical, and social history.     Objective: BP 122/70   Pulse 61   Temp 97.7 F (36.5 C) (Tympanic)   Ht 5' 2.5" (1.588 m)   Wt 172 lb 3.2 oz (78.1 kg)   SpO2 98%   BMI 30.99 kg/m   General appearance: alert, no distress, WD/WN, female Abdomen: +bs, soft, mild suprapubic and RLQ tendnerss, otherwise non tender, non distended, no masses, no hepatomegaly, no splenomegaly, no bruits Back: mild left CVA tenderness GU: deferred   Laboratory:  Urinalysis    Component Value Date/Time   LABSPEC 1.010 08/17/2022 1049    GLUCOSEU Negative 09/07/2021 1414   BILIRUBINUR negative 08/17/2022 1049   BILIRUBINUR Negative 09/07/2021 1414   KETONESUR negative 08/17/2022 1049   PROTEINUR negative 08/17/2022 1049   PROTEINUR Negative 09/07/2021 1414   NITRITE Negative 08/17/2022 1049   NITRITE Negative 09/07/2021 1414   LEUKOCYTESUR Negative 08/17/2022 1049   LEUKOCYTESUR 2+ (A) 09/07/2021 1414     Assessment: Encounter Diagnoses  Name Primary?   Urinary frequency Yes   Urinary urgency      Plan: Discussed UA findings, possible UTI.  Discussed other etiology could be excessive caffeine use and recent changes in soap and hygiene products.   Begin Bactrim empirically, await culture, cut back on tea and caffeine  I would expect you to feel much better within the next 3-4 days.  If you are not seeing improvement, or if you have worsening symptoms in the next 3-4 days, then call or return.  Desiree Thomas was seen today for urinary frequency.  Diagnoses and all orders for this visit:  Urinary frequency -     POCT Urinalysis DIP (Proadvantage Device) -     Urine Culture  Urinary urgency -     POCT Urinalysis DIP (Proadvantage Device) -     Urine Culture  Other orders -     sulfamethoxazole-trimethoprim (BACTRIM DS) 800-160 MG tablet; Take 1 tablet by mouth 2 (two) times daily.    Return make sure on scheule for either med check or CPX soon, fasting.

## 2022-08-19 LAB — URINE CULTURE

## 2022-08-19 NOTE — Progress Notes (Signed)
Results sent through MyChart

## 2022-08-20 DIAGNOSIS — M545 Low back pain, unspecified: Secondary | ICD-10-CM | POA: Diagnosis not present

## 2022-09-04 ENCOUNTER — Other Ambulatory Visit: Payer: Self-pay | Admitting: Family Medicine

## 2022-09-06 NOTE — Telephone Encounter (Signed)
Refill request last apt 08/17/22 

## 2022-09-23 ENCOUNTER — Encounter: Payer: Self-pay | Admitting: Medical

## 2022-09-23 ENCOUNTER — Ambulatory Visit (INDEPENDENT_AMBULATORY_CARE_PROVIDER_SITE_OTHER): Payer: Medicare HMO | Admitting: Medical

## 2022-09-23 VITALS — BP 120/70 | HR 60 | Temp 97.2°F | Wt 168.4 lb

## 2022-09-23 DIAGNOSIS — S93401A Sprain of unspecified ligament of right ankle, initial encounter: Secondary | ICD-10-CM

## 2022-09-23 DIAGNOSIS — M25473 Effusion, unspecified ankle: Secondary | ICD-10-CM | POA: Diagnosis not present

## 2022-09-23 DIAGNOSIS — M7989 Other specified soft tissue disorders: Secondary | ICD-10-CM | POA: Diagnosis not present

## 2022-09-23 MED ORDER — NAPROXEN 375 MG PO TABS: 375.0000 mg | ORAL_TABLET | Freq: Two times a day (BID) | ORAL | 0 refills | Status: DC

## 2022-09-23 NOTE — Patient Instructions (Signed)
Ankle pain and swelling, foot swelling I suspect you have sprained your ankle doing some of the moving items around the house Nevertheless, we will check some additional labs today to help further evaluate since both feet have been swollen You can begin the prescription Naprosyn twice daily for the next 5 to 7 days for pain and inflammation I recommend you use an Ace wrap or ankle brace along with crutches over the next week or possibly 2 weeks if needed You can use a bucket of cold water or ice water pack for cold therapy 20 minutes at a time over the weekend If not much improved in terms of swelling or pain within the next 7 days we may want to get an x-ray as well After 1 to 2 weeks if the pain and swelling is significantly improved, I would do some resistance exercises and stretching with the ankle and foot for rehabilitation If needed we can refer you to therapy for some specific ankle rehab or you can do towel esistance exercises discussed In general limit salt intake

## 2022-09-23 NOTE — Progress Notes (Signed)
Subjective:  Desiree Thomas is a 62 y.o. female who presents for Chief Complaint  Patient presents with   bilateral feet swelling    Bilateral feet swelling x 2 week. Painful to walk, soaking them in espalt salt, using asepercreme     Here for feet swelling.  Having issues for past 2 weeks.   Both feet hurt but the right ankle and foot hurts worse.   Been doing some moving of stuff at home, but doesn't recall and injury, but thinks she may have twisted her ankle at 1 point.  She denies a specific injury that was recognizable but she has pain in the right ankle and lateral foot..  No hx/o gout.  No alcohol.  Has been eating a variety of things at family reunions recently.  No fever, but says the right foot may have been a little warm to touch at 1 point.  No insect bites.  No open wounds.  Compliant with medications as usual.  No other aggravating or relieving factors.    No other c/o.  Past Medical History:  Diagnosis Date   Allergy    Arthritis    Asthma    no inhaler - as a young adult, no current problems   Chronic back pain    Depression    GERD (gastroesophageal reflux disease)    no meds, diet controlled   Hearing loss    45% on right ear   History of meningitis    1992   Hyperlipidemia    Hypertension    Migraine    Current Outpatient Medications on File Prior to Visit  Medication Sig Dispense Refill   amLODipine (NORVASC) 10 MG tablet TAKE 1 TABLET(10 MG) BY MOUTH DAILY 90 tablet 1   atorvastatin (LIPITOR) 40 MG tablet TAKE 1 TABLET(40 MG) BY MOUTH DAILY 90 tablet 0   diphenhydrAMINE (BENADRYL) 25 MG tablet Take 50 mg by mouth every 6 (six) hours as needed for allergies. Takes with Percocet     FLUoxetine (PROZAC) 20 MG capsule TAKE 1 CAPSULE(20 MG) BY MOUTH DAILY 30 capsule 3   loratadine (CLARITIN) 10 MG tablet Take 10 mg by mouth daily.     magnesium gluconate (MAGONATE) 500 MG tablet Take 500 mg by mouth daily.     methocarbamol (ROBAXIN) 750 MG tablet Take 1  tablet (750 mg total) by mouth 2 (two) times daily as needed for muscle spasms. 60 tablet 1   Multiple Vitamins-Minerals (MULTIVITAMIN WITH MINERALS) tablet Take 1 tablet by mouth daily.     ondansetron (ZOFRAN-ODT) 8 MG disintegrating tablet Take 8 mg by mouth every 8 (eight) hours as needed for nausea or vomiting.     pregabalin (LYRICA) 150 MG capsule TAKE 1 CAPSULE BY MOUTH EVERY MORNING AND EVERY AFTERNOON AND EVERY EVENING 90 capsule 4   propranolol ER (INDERAL LA) 60 MG 24 hr capsule Take 1 capsule (60 mg total) by mouth at bedtime. 90 capsule 1   QUEtiapine (SEROQUEL) 25 MG tablet TAKE 1 TABLET(25 MG) BY MOUTH AT BEDTIME AS NEEDED FOR SLEEP 90 tablet 0   rizatriptan (MAXALT) 5 MG tablet Take 5 mg by mouth as needed for migraine. May repeat in 2 hours if needed     solifenacin (VESICARE) 5 MG tablet TAKE 1 TABLET(5 MG) BY MOUTH DAILY 90 tablet 2   trolamine salicylate (ASPERCREME) 10 % cream Apply 1 Application topically as needed for muscle pain.     No current facility-administered medications on file prior to visit.  The following portions of the patient's history were reviewed and updated as appropriate: allergies, current medications, past family history, past medical history, past social history, past surgical history and problem list.  ROS Otherwise as in subjective above     Objective: BP 120/70   Pulse 60   Temp (!) 97.2 F (36.2 C)   Wt 168 lb 6.4 oz (76.4 kg)   BMI 30.31 kg/m   Wt Readings from Last 3 Encounters:  09/23/22 168 lb 6.4 oz (76.4 kg)  08/17/22 172 lb 3.2 oz (78.1 kg)  05/04/22 165 lb (74.8 kg)   General appearance: alert, no distress, well developed, well nourished Heart: RRR, normal S1, S2, no murmurs Lungs: CTA bilaterally, no wheezes, rhonchi, or rales MSK: Right foot and ankle with some mild generalized swelling, tender of the right lateral malleolus and right lateral ligaments, tender somewhat of the right foot in general, left foot is  slightly swollen including the ankle but not quite as much as the right foot.  There is mild tenderness of the left foot in general.  Otherwise lower legs are nonswollen.  No obvious wound or deformity. Legs neurovascularly intact Pulses: 2+ radial pulses, 2+ pedal pulses, normal cap refill      Assessment: Encounter Diagnoses  Name Primary?   Foot swelling Yes   Ankle swelling, unspecified laterality    Sprain of right ankle, unspecified ligament, initial encounter      Plan: Ankle pain and swelling, foot swelling I suspect you have sprained your ankle doing some of the moving items around the house Nevertheless, we will check some additional labs today to help further evaluate since both feet have been swollen You can begin the prescription Naprosyn twice daily for the next 5 to 7 days for pain and inflammation I recommend you use an Ace wrap or ankle brace along with crutches over the next week or possibly 2 weeks if needed You can use a bucket of cold water or ice water pack for cold therapy 20 minutes at a time over the weekend If not much improved in terms of swelling or pain within the next 7 days we may want to get an x-ray as well After 1 to 2 weeks if the pain and swelling is significantly improved, I would do some resistance exercises and stretching with the ankle and foot for rehabilitation If needed we can refer you to therapy for some specific ankle rehab or you can do towel resistance exercises discussed In general limit salt intake    Desiree Thomas was seen today for bilateral feet swelling.  Diagnoses and all orders for this visit:  Foot swelling -     Uric acid -     Comprehensive metabolic panel  Ankle swelling, unspecified laterality -     Uric acid -     Comprehensive metabolic panel  Sprain of right ankle, unspecified ligament, initial encounter    Follow up: pending labs

## 2022-09-24 LAB — COMPREHENSIVE METABOLIC PANEL
ALT: 16 IU/L (ref 0–32)
AST: 29 IU/L (ref 0–40)
Albumin: 4.2 g/dL (ref 3.8–4.9)
Alkaline Phosphatase: 112 IU/L (ref 44–121)
BUN/Creatinine Ratio: 14 (ref 12–28)
BUN: 12 mg/dL (ref 8–27)
Bilirubin Total: 0.3 mg/dL (ref 0.0–1.2)
CO2: 23 mmol/L (ref 20–29)
Calcium: 9.4 mg/dL (ref 8.7–10.3)
Chloride: 106 mmol/L (ref 96–106)
Creatinine, Ser: 0.86 mg/dL (ref 0.57–1.00)
Globulin, Total: 2.7 g/dL (ref 1.5–4.5)
Glucose: 89 mg/dL (ref 70–99)
Potassium: 4.5 mmol/L (ref 3.5–5.2)
Sodium: 141 mmol/L (ref 134–144)
Total Protein: 6.9 g/dL (ref 6.0–8.5)
eGFR: 77 mL/min/{1.73_m2} (ref >59)

## 2022-09-24 LAB — URIC ACID: Uric Acid: 3.8 mg/dL (ref 3.0–7.2)

## 2022-09-24 NOTE — Progress Notes (Signed)
Results sent through MyChart

## 2022-10-10 ENCOUNTER — Other Ambulatory Visit: Payer: Self-pay | Admitting: Medical

## 2022-11-03 ENCOUNTER — Other Ambulatory Visit: Payer: Self-pay | Admitting: Medical

## 2022-11-04 DIAGNOSIS — M65321 Trigger finger, right index finger: Secondary | ICD-10-CM | POA: Diagnosis not present

## 2022-11-06 ENCOUNTER — Other Ambulatory Visit: Payer: Self-pay | Admitting: Medical

## 2022-11-24 ENCOUNTER — Encounter: Payer: Medicare HMO | Admitting: Medical

## 2022-11-27 ENCOUNTER — Other Ambulatory Visit: Payer: Self-pay | Admitting: Medical

## 2022-12-08 ENCOUNTER — Other Ambulatory Visit: Payer: Self-pay | Admitting: Medical

## 2022-12-08 ENCOUNTER — Telehealth: Payer: Self-pay | Admitting: Medical

## 2022-12-08 MED ORDER — PREGABALIN 150 MG PO CAPS: ORAL_CAPSULE | ORAL | 2 refills | Status: DC

## 2022-12-08 MED ORDER — METHOCARBAMOL 750 MG PO TABS: 750.0000 mg | ORAL_TABLET | Freq: Two times a day (BID) | ORAL | 2 refills | Status: DC | PRN

## 2022-12-08 NOTE — Telephone Encounter (Signed)
Pt needs refill on gabapentin and methocarbamol  [facility]

## 2022-12-09 ENCOUNTER — Other Ambulatory Visit: Payer: Self-pay | Admitting: Medical

## 2022-12-14 DIAGNOSIS — M19041 Primary osteoarthritis, right hand: Secondary | ICD-10-CM | POA: Diagnosis not present

## 2022-12-14 DIAGNOSIS — M65321 Trigger finger, right index finger: Secondary | ICD-10-CM | POA: Diagnosis not present

## 2022-12-21 ENCOUNTER — Other Ambulatory Visit: Payer: Self-pay | Admitting: Medical

## 2022-12-21 DIAGNOSIS — Z1231 Encounter for screening mammogram for malignant neoplasm of breast: Secondary | ICD-10-CM

## 2022-12-29 DIAGNOSIS — M65331 Trigger finger, right middle finger: Secondary | ICD-10-CM | POA: Diagnosis not present

## 2022-12-29 DIAGNOSIS — M65321 Trigger finger, right index finger: Secondary | ICD-10-CM | POA: Diagnosis not present

## 2023-01-04 ENCOUNTER — Other Ambulatory Visit: Payer: Self-pay | Admitting: Medical

## 2023-01-25 ENCOUNTER — Ambulatory Visit: Payer: Medicare HMO

## 2023-01-25 ENCOUNTER — Encounter: Payer: Self-pay | Admitting: Medical

## 2023-01-25 ENCOUNTER — Ambulatory Visit (INDEPENDENT_AMBULATORY_CARE_PROVIDER_SITE_OTHER): Payer: Medicare HMO | Admitting: Medical

## 2023-01-25 VITALS — BP 108/64 | HR 64 | Ht 62.5 in | Wt 166.0 lb

## 2023-01-25 DIAGNOSIS — K219 Gastro-esophageal reflux disease without esophagitis: Secondary | ICD-10-CM

## 2023-01-25 DIAGNOSIS — G8929 Other chronic pain: Secondary | ICD-10-CM

## 2023-01-25 DIAGNOSIS — M542 Cervicalgia: Secondary | ICD-10-CM | POA: Diagnosis not present

## 2023-01-25 DIAGNOSIS — N3281 Overactive bladder: Secondary | ICD-10-CM

## 2023-01-25 DIAGNOSIS — I2510 Atherosclerotic heart disease of native coronary artery without angina pectoris: Secondary | ICD-10-CM | POA: Diagnosis not present

## 2023-01-25 DIAGNOSIS — E559 Vitamin D deficiency, unspecified: Secondary | ICD-10-CM | POA: Diagnosis not present

## 2023-01-25 DIAGNOSIS — I700 Atherosclerosis of aorta: Secondary | ICD-10-CM

## 2023-01-25 DIAGNOSIS — I8311 Varicose veins of right lower extremity with inflammation: Secondary | ICD-10-CM | POA: Diagnosis not present

## 2023-01-25 DIAGNOSIS — I8312 Varicose veins of left lower extremity with inflammation: Secondary | ICD-10-CM

## 2023-01-25 DIAGNOSIS — I10 Essential (primary) hypertension: Secondary | ICD-10-CM

## 2023-01-25 DIAGNOSIS — E785 Hyperlipidemia, unspecified: Secondary | ICD-10-CM

## 2023-01-25 DIAGNOSIS — Z0000 Encounter for general adult medical examination without abnormal findings: Secondary | ICD-10-CM | POA: Diagnosis not present

## 2023-01-25 DIAGNOSIS — M545 Low back pain, unspecified: Secondary | ICD-10-CM

## 2023-01-25 DIAGNOSIS — Z122 Encounter for screening for malignant neoplasm of respiratory organs: Secondary | ICD-10-CM

## 2023-01-25 DIAGNOSIS — Z87891 Personal history of nicotine dependence: Secondary | ICD-10-CM | POA: Insufficient documentation

## 2023-01-25 DIAGNOSIS — Z1231 Encounter for screening mammogram for malignant neoplasm of breast: Secondary | ICD-10-CM

## 2023-01-25 DIAGNOSIS — Z90710 Acquired absence of both cervix and uterus: Secondary | ICD-10-CM

## 2023-01-25 DIAGNOSIS — Z780 Asymptomatic menopausal state: Secondary | ICD-10-CM | POA: Insufficient documentation

## 2023-01-25 NOTE — Progress Notes (Signed)
Subjective:   HPI  Desiree Thomas is a 62 y.o. female who presents for Chief Complaint  Patient presents with   Annual Exam    Nonfasting annual exam. No new concerns. Due for colonoscopy. Needs referral, used to see [facility] but no longer lives there. Wonders if she should see a neuro due to balance/falls and sometimes she thinks her speech may be off.    Patient Care Team: Jacie Tristan, Kermit Balo, PA-C as PCP - General (Family Medicine) Estill Bamberg, MD as Consulting Physician (Orthopedic Surgery) Sees dentist Sees eye doctor Dr. Ernestina Columbia, orthopedics, [facility] Dr. Naomie Dean, neurology Prior care at [facility] before moving her in past year Pain management   Concerns: Quit smoking 02/2022  No recent migraines issues for months  She has a history of chronic back pain, history of low back surgery and is on disability for pain and migraines.  She does take Lyrica 3 times a day, uses Robaxin, naprosyn. Formerly was seeing pain clinic 2023.  She has migraines.  Sees neurology  Insomnia-uses seroquel nightly for sleep  Hyperlipidemia-compliant with Lipitor 40 mg daily  She is compliant with Prozac 20 mg daily  Hypertension-compliant with amlodipine 10 mg daily, propanolol LA 60 mg daily  She has history of endometriosis and prior hysterectomy for this  She gets some knots in her abdomen, pain from time to time  Has ongoing irritation of right hand from prior surgery.  Thinks its related to absorbable sutures irritation.   Reviewed their medical, surgical, family, social, medication, and allergy history and updated chart as appropriate.  Past Medical History:  Diagnosis Date   Allergy    Arthritis    Asthma    no inhaler - as a young adult, no current problems   Chronic back pain    Depression    GERD (gastroesophageal reflux disease)    no meds, diet controlled   Hearing loss    45% on right ear   History of meningitis    1992    Hyperlipidemia    Hypertension    Migraine     Family History  Problem Relation Age of Onset   Arthritis Mother    Heart disease Mother    Asthma Mother    Diverticulitis Mother    Migraines Mother    Diabetes Father    Diverticulitis Father    Arthritis Sister    Asthma Sister    Migraines Sister    Arthritis Sister    Asthma Sister    Cancer Maternal Grandmother        stomach   Diabetes Paternal Grandmother    Dementia Paternal Grandmother    Migraines Daughter      Current Outpatient Medications:    amLODipine (NORVASC) 10 MG tablet, TAKE 1 TABLET(10 MG) BY MOUTH DAILY, Disp: 90 tablet, Rfl: 0   atorvastatin (LIPITOR) 40 MG tablet, TAKE 1 TABLET(40 MG) BY MOUTH DAILY, Disp: 90 tablet, Rfl: 0   FLUoxetine (PROZAC) 20 MG capsule, TAKE 1 CAPSULE(20 MG) BY MOUTH DAILY, Disp: 30 capsule, Rfl: 2   loratadine (CLARITIN) 10 MG tablet, Take 10 mg by mouth daily., Disp: , Rfl:    magnesium gluconate (MAGONATE) 500 MG tablet, Take 500 mg by mouth daily., Disp: , Rfl:    Multiple Vitamins-Minerals (MULTIVITAMIN WITH MINERALS) tablet, Take 1 tablet by mouth daily., Disp: , Rfl:    naproxen (NAPROSYN) 375 MG tablet, TAKE 1 TABLET(375 MG) BY MOUTH TWICE DAILY WITH A MEAL, Disp: 30  tablet, Rfl: 0   pregabalin (LYRICA) 150 MG capsule, TAKE 1 CAPSULE BY MOUTH EVERY MORNING AND EVERY AFTERNOON AND EVERY EVENING, Disp: 90 capsule, Rfl: 2   propranolol ER (INDERAL LA) 60 MG 24 hr capsule, Take 1 capsule (60 mg total) by mouth at bedtime., Disp: 90 capsule, Rfl: 1   solifenacin (VESICARE) 5 MG tablet, TAKE 1 TABLET(5 MG) BY MOUTH DAILY, Disp: 90 tablet, Rfl: 2   diphenhydrAMINE (BENADRYL) 25 MG tablet, Take 50 mg by mouth every 6 (six) hours as needed for allergies. Takes with Percocet (Patient not taking: Reported on 01/25/2023), Disp: , Rfl:    methocarbamol (ROBAXIN) 750 MG tablet, Take 1 tablet (750 mg total) by mouth 2 (two) times daily as needed for muscle spasms. (Patient not taking:  Reported on 01/25/2023), Disp: 60 tablet, Rfl: 2   ondansetron (ZOFRAN-ODT) 8 MG disintegrating tablet, Take 8 mg by mouth every 8 (eight) hours as needed for nausea or vomiting. (Patient not taking: Reported on 01/25/2023), Disp: , Rfl:    QUEtiapine (SEROQUEL) 25 MG tablet, TAKE 1 TABLET(25 MG) BY MOUTH AT BEDTIME AS NEEDED FOR SLEEP (Patient not taking: Reported on 01/25/2023), Disp: 90 tablet, Rfl: 0   rizatriptan (MAXALT) 5 MG tablet, Take 5 mg by mouth as needed for migraine. May repeat in 2 hours if needed (Patient not taking: Reported on 01/25/2023), Disp: , Rfl:    trolamine salicylate (ASPERCREME) 10 % cream, Apply 1 Application topically as needed for muscle pain. (Patient not taking: Reported on 01/25/2023), Disp: , Rfl:   Allergies  Allergen Reactions   Morphine And Codeine Anaphylaxis   Penicillins Hives and Itching   Percocet [Oxycodone-Acetaminophen] Itching    OK with benadryl   Topamax [Topiramate] Other (See Comments)    Emotional    Vicodin Hp [Hydrocodone-Acetaminophen] Itching    Severe     Review of Systems  Constitutional:  Negative for chills, fever, malaise/fatigue and weight loss.  HENT:  Negative for congestion, ear pain, hearing loss, sore throat and tinnitus.   Eyes:  Negative for blurred vision, pain and redness.  Respiratory:  Negative for cough, hemoptysis and shortness of breath.   Cardiovascular:  Negative for chest pain, palpitations, orthopnea, claudication and leg swelling.  Gastrointestinal:  Negative for abdominal pain, blood in stool, constipation, diarrhea, nausea and vomiting.  Genitourinary:  Negative for dysuria, flank pain, frequency, hematuria and urgency.  Musculoskeletal:  Positive for back pain. Negative for falls, joint pain and myalgias.  Skin:  Negative for itching and rash.  Neurological:  Negative for dizziness, tingling, speech change, weakness and headaches.  Endo/Heme/Allergies:  Negative for polydipsia. Does not bruise/bleed  easily.  Psychiatric/Behavioral:  Negative for depression and memory loss. The patient is not nervous/anxious and does not have insomnia.          01/25/2023    3:02 PM 05/04/2022   12:05 PM 12/09/2021    8:41 AM 09/07/2021   11:39 AM 04/10/2021    8:34 AM  Depression screen PHQ 2/9  Decreased Interest 3 0 0 0 0  Down, Depressed, Hopeless 3 0 0 0 0  PHQ - 2 Score 6 0 0 0 0  Altered sleeping 3 0 0 0   Tired, decreased energy 0 0 0 0   Change in appetite 1 0 0 0   Feeling bad or failure about yourself  0 0 0 0   Trouble concentrating 0 0 0 0   Moving slowly or fidgety/restless 0 0 0 0  Suicidal thoughts 0 0 0 0   PHQ-9 Score 10 0 0 0   Difficult doing work/chores Not difficult at all Not difficult at all Not difficult at all Not difficult at all        Objective:  BP 108/64   Pulse 64   Ht 5' 2.5" (1.588 m)   Wt 166 lb (75.3 kg)   SpO2 96%   BMI 29.88 kg/m   Wt Readings from Last 3 Encounters:  01/25/23 166 lb (75.3 kg)  09/23/22 168 lb 6.4 oz (76.4 kg)  08/17/22 172 lb 3.2 oz (78.1 kg)    General appearance: alert, no distress, WD/WN, African American female Skin: Right palm with some scar tissue, slight tenderness over the medial right palm, tattoo left wrist, otherwise no worrisome lesions HEENT: normocephalic, conjunctiva/corneas normal, sclerae anicteric, PERRLA, EOMi Neck: supple, no lymphadenopathy, no thyromegaly, no masses, normal ROM, no bruits Chest: non tender, normal shape and expansion Heart: RRR, normal S1, S2, no murmurs Lungs: CTA bilaterally, no wheezes, rhonchi, or rales Abdomen: +bs, soft, non tender, non distended, no masses, no hepatomegaly, no splenomegaly, no bruits Back: lumbar surgical scar, relatively full ROM, non tender, no scoliosis Musculoskeletal: arms nontender upper extremities non tender, no obvious deformity, normal ROM throughout, lower extremities non tender, no obvious deformity, normal ROM throughout Extremities: scattered small  soft tissue swellings of both lower legs suggestive of varicose veins and vein valves, otherwise no edema, no cyanosis, no clubbing Pulses: 2+ symmetric, upper and 1+ lower extremities, normal cap refill Neurological: alert, oriented x 3, CN2-12 intact, strength normal upper extremities and lower extremities, sensation normal throughout, DTRs 2+ throughout, no cerebellar signs, gait normal Psychiatric: normal affect, behavior normal, pleasant  Breast/gyn/rectal - deferred    Assessment and Plan :   Encounter Diagnoses  Name Primary?   Encounter for health maintenance examination in adult Yes   Encounter for screening mammogram for malignant neoplasm of breast    Coronary artery disease involving native coronary artery of native heart without angina pectoris    Chronic bilateral low back pain without sciatica    Aortic atherosclerosis (HCC)    Chronic neck pain    Essential hypertension, benign    Vitamin D deficiency    Varicose veins of both lower extremities with inflammation    Screening for lung cancer    Overactive bladder    Hyperlipidemia, unspecified hyperlipidemia type    H/O: hysterectomy    Gastroesophageal reflux disease, unspecified whether esophagitis present    Postmenopausal estrogen deficiency    Former smoker      Physical exam today, discussed preventative measures, screening labs, cancer screenings, vaccines, general wellness recommendations  Vaccination recommendations were reviewed Immunization History  Administered Date(s) Administered   Influenza Split 12/24/2020   Influenza,inj,Quad PF,6+ Mos 12/09/2021   Influenza-Unspecified 11/27/2022   PFIZER(Purple Top)SARS-COV-2 Vaccination 06/12/2019, 07/03/2019, 07/08/2020   Pfizer(Comirnaty)Fall Seasonal Vaccine 12 years and older 01/08/2022, 11/27/2022   Pneumococcal Polysaccharide-23 12/24/2020   Respiratory Syncytial Virus Vaccine,Recomb Aduvanted(Arexvy) 01/28/2022   Td 09/04/2020   We will try and  get documentation from prior Shingrix, and recent flu and covid vaccines done at [facility].   Screening for cancer: Colon cancer screening: Referral for updated colonoscopy  Breast cancer screening: You should perform a self breast exam monthly.   We reviewed recommendations for regular mammograms and breast cancer screening.  Cervical cancer screening: We reviewed recommendations for pap smear screening.  She is status post hysterectomy   Skin cancer screening: Check  your skin regularly for new changes, growing lesions, or other lesions of concern Come in for evaluation if you have skin lesions of concern.  Lung cancer screening: If you have a greater than 20 pack year history of tobacco use, then you may qualify for lung cancer screening with a chest CT scan.   Please call your insurance company to inquire about coverage for this test.  Chest CT ordered   We currently don't have screenings for other cancers besides breast, cervical, colon, and lung cancers.  If you have a strong family history of cancer or have other cancer screening concerns, please let me know.    Bone health: Get at least 150 minutes of aerobic exercise weekly Get weight bearing exercise at least once weekly Bone density test:  A bone density test is an imaging test that uses a type of X-ray to measure the amount of calcium and other minerals in your bones. The test may be used to diagnose or screen you for a condition that causes weak or thin bones (osteoporosis), predict your risk for a broken bone (fracture), or determine how well your osteoporosis treatment is working. The bone density test is recommended for females 65 and older, or females or males <65 if certain risk factors such as thyroid disease, long term use of steroids such as for asthma or rheumatological issues, vitamin D deficiency, estrogen deficiency, family history of osteoporosis, self or family history of fragility fracture in first degree  relative.  Please call to schedule your bone density test.   [facility]  [phone] [address], [address] [place], [place] [address]     Heart health: Get at least 150 minutes of aerobic exercise weekly Limit alcohol It is important to maintain a healthy blood pressure and healthy cholesterol numbers  Heart disease screening: Screening for heart disease includes screening for blood pressure, fasting lipids, glucose/diabetes screening, BMI height to weight ratio, reviewed of smoking status, physical activity, and diet.    Goals include blood pressure 120/80 or less, maintaining a healthy lipid/cholesterol profile, preventing diabetes or keeping diabetes numbers under good control, not smoking or using tobacco products, exercising most days per week or at least 150 minutes per week of exercise, and eating healthy variety of fruits and vegetables, healthy oils, and avoiding unhealthy food choices like fried food, fast food, high sugar and high cholesterol foods.      Medical care options: I recommend you continue to seek care here first for routine care.  We try really hard to have available appointments Monday through Friday daytime hours for sick visits, acute visits, and physicals.  Urgent care should be used for after hours and weekends for significant issues that cannot wait till the next day.  The emergency department should be used for significant potentially life-threatening emergencies.  The emergency department is expensive, can often have long wait times for less significant concerns, so try to utilize primary care, urgent care, or telemedicine when possible to avoid unnecessary trips to the emergency department.  Virtual visits and telemedicine have been introduced since the pandemic started in 2020, and can be convenient ways to receive medical care.  We offer virtual appointments as well to assist you in a variety of options to seek medical  care.    Separate significant issues discussed: Vitamin D deficiency-updated labs ordered  Hypertension-continue amlodipine 10 mg daily.  She is taking propanolol for blood pressure and migraine prevention.    Hyperlipidemia-on statin.  Labs ordered  Chronic back pain, Chronic neck and arm pain- on Lyrica, Robaxin, Naprosyn  Insomnia-continue Seroquel.  This works better for her than trazodone  Migraines-managed by neurology  Depression in remission-continue Prozac 20 mg daily.  Overactive bladder-currently using Vesicare 5 mg daily    Jariah was seen today for annual exam.  Diagnoses and all orders for this visit:  Encounter for health maintenance examination in adult -     Ambulatory referral to Gastroenterology -     CT CHEST LUNG CA SCREEN LOW DOSE W/O CM; Future -     DG Bone Density; Future -     CBC; Future -     POCT Urinalysis DIP (Proadvantage Device); Future -     VITAMIN D 25 Hydroxy (Vit-D Deficiency, Fractures); Future -     Lipid panel; Future -     Comprehensive metabolic panel; Future -     MM 3D SCREENING MAMMOGRAM BILATERAL BREAST  Encounter for screening mammogram for malignant neoplasm of breast -     MM 3D SCREENING MAMMOGRAM BILATERAL BREAST  Coronary artery disease involving native coronary artery of native heart without angina pectoris  Chronic bilateral low back pain without sciatica  Aortic atherosclerosis (HCC)  Chronic neck pain  Essential hypertension, benign  Vitamin D deficiency -     VITAMIN D 25 Hydroxy (Vit-D Deficiency, Fractures); Future  Varicose veins of both lower extremities with inflammation  Screening for lung cancer -     CT CHEST LUNG CA SCREEN LOW DOSE W/O CM; Future  Overactive bladder -     POCT Urinalysis DIP (Proadvantage Device); Future  Hyperlipidemia, unspecified hyperlipidemia type -     Lipid panel; Future -     Comprehensive metabolic panel; Future  H/O: hysterectomy  Gastroesophageal reflux  disease, unspecified whether esophagitis present  Postmenopausal estrogen deficiency -     DG Bone Density; Future  Former smoker    Follow-up pending labs, yearly for physical

## 2023-01-26 ENCOUNTER — Other Ambulatory Visit: Payer: Medicare HMO

## 2023-01-26 DIAGNOSIS — E559 Vitamin D deficiency, unspecified: Secondary | ICD-10-CM

## 2023-01-26 DIAGNOSIS — Z0000 Encounter for general adult medical examination without abnormal findings: Secondary | ICD-10-CM

## 2023-01-26 DIAGNOSIS — N3281 Overactive bladder: Secondary | ICD-10-CM

## 2023-01-26 DIAGNOSIS — E785 Hyperlipidemia, unspecified: Secondary | ICD-10-CM

## 2023-01-27 ENCOUNTER — Other Ambulatory Visit: Payer: Self-pay | Admitting: Medical

## 2023-01-27 LAB — LIPID PANEL
Chol/HDL Ratio: 3.1 ratio (ref 0.0–4.4)
Cholesterol, Total: 174 mg/dL (ref 100–199)
HDL: 57 mg/dL (ref >39)
LDL Chol Calc (NIH): 95 mg/dL (ref 0–99)
Triglycerides: 122 mg/dL (ref 0–149)
VLDL Cholesterol Cal: 22 mg/dL (ref 5–40)

## 2023-01-27 LAB — COMPREHENSIVE METABOLIC PANEL
ALT: 15 IU/L (ref 0–32)
AST: 26 [IU]/L (ref 0–40)
Albumin: 4.5 g/dL (ref 3.9–4.9)
Alkaline Phosphatase: 113 IU/L (ref 44–121)
BUN/Creatinine Ratio: 19 (ref 12–28)
BUN: 14 mg/dL (ref 8–27)
Bilirubin Total: 0.4 mg/dL (ref 0.0–1.2)
CO2: 20 mmol/L (ref 20–29)
Calcium: 9.8 mg/dL (ref 8.7–10.3)
Chloride: 105 mmol/L (ref 96–106)
Creatinine, Ser: 0.74 mg/dL (ref 0.57–1.00)
Globulin, Total: 2.4 g/dL (ref 1.5–4.5)
Glucose: 82 mg/dL (ref 70–99)
Potassium: 4.5 mmol/L (ref 3.5–5.2)
Sodium: 141 mmol/L (ref 134–144)
Total Protein: 6.9 g/dL (ref 6.0–8.5)
eGFR: 92 mL/min/{1.73_m2} (ref >59)

## 2023-01-27 LAB — CBC
Hematocrit: 41.4 % (ref 34.0–46.6)
Hemoglobin: 13.6 g/dL (ref 11.1–15.9)
MCH: 27.9 pg (ref 26.6–33.0)
MCHC: 32.9 g/dL (ref 31.5–35.7)
MCV: 85 fL (ref 79–97)
Platelets: 403 10*3/uL (ref 150–450)
RBC: 4.87 x10E6/uL (ref 3.77–5.28)
RDW: 15.4 % (ref 11.7–15.4)
WBC: 6.5 10*3/uL (ref 3.4–10.8)

## 2023-01-27 LAB — VITAMIN D 25 HYDROXY (VIT D DEFICIENCY, FRACTURES): Vit D, 25-Hydroxy: 50.8 ng/mL (ref 30.0–100.0)

## 2023-01-27 NOTE — Progress Notes (Signed)
Labs show normal blood sugar, liver, kidney, electrolytes, normal cholesterol, vitamin D normal, normal blood count.  Thankfully all of your labs are normal.

## 2023-01-31 ENCOUNTER — Telehealth: Payer: Self-pay | Admitting: Gastroenterology

## 2023-01-31 NOTE — Telephone Encounter (Signed)
Error

## 2023-02-01 ENCOUNTER — Other Ambulatory Visit: Payer: Self-pay | Admitting: Medical

## 2023-02-05 ENCOUNTER — Other Ambulatory Visit: Payer: Self-pay | Admitting: Medical

## 2023-02-15 ENCOUNTER — Encounter: Payer: Self-pay | Admitting: Family Medicine

## 2023-02-17 ENCOUNTER — Inpatient Hospital Stay: Admission: RE | Admit: 2023-02-17 | Payer: Medicare HMO | Source: Ambulatory Visit

## 2023-02-18 ENCOUNTER — Ambulatory Visit
Admission: RE | Admit: 2023-02-18 | Discharge: 2023-02-18 | Disposition: A | Discharge status: Home / Self Care | Payer: Medicare HMO | Source: Ambulatory Visit | Attending: Medical | Admitting: Medical

## 2023-02-18 DIAGNOSIS — Z122 Encounter for screening for malignant neoplasm of respiratory organs: Secondary | ICD-10-CM

## 2023-02-18 DIAGNOSIS — Z87891 Personal history of nicotine dependence: Secondary | ICD-10-CM | POA: Diagnosis not present

## 2023-02-18 DIAGNOSIS — Z0000 Encounter for general adult medical examination without abnormal findings: Secondary | ICD-10-CM

## 2023-02-18 DIAGNOSIS — I700 Atherosclerosis of aorta: Secondary | ICD-10-CM | POA: Diagnosis not present

## 2023-02-22 ENCOUNTER — Other Ambulatory Visit: Payer: Self-pay | Admitting: Medical

## 2023-03-03 ENCOUNTER — Ambulatory Visit: Payer: Medicare HMO

## 2023-03-05 NOTE — Progress Notes (Signed)
Lets get in for appointment to discuss her recent chest CT results and recommendations

## 2023-03-11 ENCOUNTER — Ambulatory Visit (INDEPENDENT_AMBULATORY_CARE_PROVIDER_SITE_OTHER): Payer: Medicare HMO | Admitting: Medical

## 2023-03-11 VITALS — BP 120/70 | HR 66 | Wt 166.8 lb

## 2023-03-11 DIAGNOSIS — I700 Atherosclerosis of aorta: Secondary | ICD-10-CM

## 2023-03-11 DIAGNOSIS — I10 Essential (primary) hypertension: Secondary | ICD-10-CM

## 2023-03-11 DIAGNOSIS — E785 Hyperlipidemia, unspecified: Secondary | ICD-10-CM | POA: Diagnosis not present

## 2023-03-11 DIAGNOSIS — I2510 Atherosclerotic heart disease of native coronary artery without angina pectoris: Secondary | ICD-10-CM | POA: Diagnosis not present

## 2023-03-11 DIAGNOSIS — Z87891 Personal history of nicotine dependence: Secondary | ICD-10-CM | POA: Diagnosis not present

## 2023-03-11 MED ORDER — REPATHA SURECLICK 140 MG/ML ~~LOC~~ SOAJ: 140.0000 mg | SUBCUTANEOUS | 11 refills | Status: DC

## 2023-03-11 NOTE — Progress Notes (Signed)
Subjective:  Desiree Thomas is a 62 y.o. female who presents for Chief Complaint  Patient presents with   Consult    Discuss CT results     Here to discuss recent CT chest.  Former smoker, quit last year.  She is compliant with her atorvastatin 40 mg and blood pressure medication daily. She exercises regularly.  She eats pretty healthy and does not eat meat every single day.  She has been trying to eat healthy in general.  She eats a lot of vegetables.  She has no particular symptoms.  No chest pain, no palpitations, no edema, no shortness of breath, no difficulty with exercise.  No other aggravating or relieving factors.    No other c/o.  Past Medical History:  Diagnosis Date   Allergy    Arthritis    Asthma    no inhaler - as a young adult, no current problems   Chronic back pain    Depression    GERD (gastroesophageal reflux disease)    no meds, diet controlled   Hearing loss    45% on right ear   History of meningitis    1992   Hyperlipidemia    Hypertension    Migraine    Current Outpatient Medications on File Prior to Visit  Medication Sig Dispense Refill   amLODipine (NORVASC) 10 MG tablet TAKE 1 TABLET(10 MG) BY MOUTH DAILY 90 tablet 0   atorvastatin (LIPITOR) 40 MG tablet TAKE 1 TABLET(40 MG) BY MOUTH DAILY 90 tablet 0   FLUoxetine (PROZAC) 20 MG capsule TAKE 1 CAPSULE(20 MG) BY MOUTH DAILY 90 capsule 1   loratadine (CLARITIN) 10 MG tablet Take 10 mg by mouth daily.     magnesium gluconate (MAGONATE) 500 MG tablet Take 500 mg by mouth daily.     methocarbamol (ROBAXIN) 750 MG tablet Take 1 tablet (750 mg total) by mouth 2 (two) times daily as needed for muscle spasms. 60 tablet 2   Multiple Vitamins-Minerals (MULTIVITAMIN WITH MINERALS) tablet Take 1 tablet by mouth daily.     naproxen (NAPROSYN) 375 MG tablet TAKE 1 TABLET(375 MG) BY MOUTH TWICE DAILY WITH A MEAL 30 tablet 0   ondansetron (ZOFRAN-ODT) 8 MG disintegrating tablet Take 8 mg by mouth every 8 (eight)  hours as needed for nausea or vomiting.     pregabalin (LYRICA) 150 MG capsule TAKE 1 CAPSULE BY MOUTH EVERY MORNING AND EVERY AFTERNOON AND EVERY EVENING 90 capsule 2   propranolol ER (INDERAL LA) 60 MG 24 hr capsule TAKE 1 CAPSULE(60 MG) BY MOUTH AT BEDTIME 90 capsule 1   QUEtiapine (SEROQUEL) 25 MG tablet TAKE 1 TABLET(25 MG) BY MOUTH AT BEDTIME AS NEEDED FOR SLEEP 90 tablet 1   rizatriptan (MAXALT) 5 MG tablet Take 5 mg by mouth as needed for migraine. May repeat in 2 hours if needed     solifenacin (VESICARE) 5 MG tablet TAKE 1 TABLET(5 MG) BY MOUTH DAILY 90 tablet 2   trolamine salicylate (ASPERCREME) 10 % cream Apply 1 Application topically as needed for muscle pain.     No current facility-administered medications on file prior to visit.     The following portions of the patient's history were reviewed and updated as appropriate: allergies, current medications, past family history, past medical history, past social history, past surgical history and problem list.  ROS Otherwise as in subjective above  Objective: BP 120/70   Pulse 66   Wt 166 lb 12.8 oz (75.7 kg)   BMI 30.02  kg/m   General appearance: alert, no distress, well developed, well nourished Neck: supple, no lymphadenopathy, no thyromegaly, no masses, no bruits Heart: RRR, normal S1, S2, no murmurs Lungs: CTA bilaterally, no wheezes, rhonchi, or rales Abdomen: +bs, soft, non tender, non distended, no masses, no hepatomegaly, no splenomegaly, no bruits Pulses: 2+ radial pulses, 2+ pedal pulses, normal cap refill Ext: no edema   CT coronary test 03/04/23 IMPRESSION: 1. Lung-RADS 1, negative. Continue annual screening with low-dose chest CT without contrast in 12 months. 2.  Aortic Atherosclerosis (ICD10-I70.0). 3. Age advanced coronary artery atherosclerosis. Recommend assessment of coronary risk factors.   Assessment: Encounter Diagnoses  Name Primary?   Aortic atherosclerosis (HCC) Yes   Coronary  arteriosclerosis    Former smoker    Essential hypertension, benign    Hyperlipidemia, unspecified hyperlipidemia type      Plan: We discussed the CT chest findings.  Fortunately no lung cancer, no tumors or nodules or lymphadenopathy.  The main finding is aortic atherosclerosis and coronary artery atherosclerosis.  She is already on atorvastatin 40 mg.  We discussed adding Repatha to get the LDL down less than 50 and adding aspirin 81 mg daily.  She is agreeable.  We also discussed possibly doing other screening such as abdominal aortic aneurysm screening and carotid artery ultrasound   We will work to get the Repatha approved by insurance  Plan for repeat lung cancer chest CT screening in 1 year   Patient Instructions  I recommend you call your insurance in January to see what your deductible or copay would be to do a carotid Ultrasound and abdominal Aortic Aneurysm ultrasound.   If your copay or deducible is more than $150, then use the [employer] test for $150 that includes these and 2 other tests.  Continue your atorvastatin Lipitor 40mg  daily Add aspirin 81mg  daily.  Take the Lipitor and aspirin at bedtime I want to add Repatha injection every 2 weeks to help get your LDL under 50 for prevention  If you develop any chest pain, difficulty breathing or intolerance to exercise going forward, we would have you see cardiology as well  Continue yearly CT lung cancer screen  Continue your blood pressure and other medicaitons as usual   Shingles vaccine:  I recommend you have a shingles vaccine to help prevent shingles or herpes zoster outbreak.   Please call your insurer to inquire about coverage for the Shingrix vaccine given in 2 doses.   Some insurers cover this vaccine after age 54, some cover this after age 16.  If your insurer covers this, then call to schedule appointment to have this vaccine here.      Desiree Thomas was seen today for consult.  Diagnoses and all orders  for this visit:  Aortic atherosclerosis (HCC)  Coronary arteriosclerosis  Former smoker  Essential hypertension, benign  Hyperlipidemia, unspecified hyperlipidemia type  Other orders -     Evolocumab (REPATHA SURECLICK) 140 MG/ML SOAJ; Inject 140 mg into the skin every 14 (fourteen) days.    Follow up: 4-6 mo

## 2023-03-11 NOTE — Patient Instructions (Signed)
I recommend you call your insurance in January to see what your deductible or copay would be to do a carotid Ultrasound and abdominal Aortic Aneurysm ultrasound.   If your copay or deducible is more than $150, then use the [employer] test for $150 that includes these and 2 other tests.  Continue your atorvastatin Lipitor 40mg  daily Add aspirin 81mg  daily.  Take the Lipitor and aspirin at bedtime I want to add Repatha injection every 2 weeks to help get your LDL under 50 for prevention  If you develop any chest pain, difficulty breathing or intolerance to exercise going forward, we would have you see cardiology as well  Continue yearly CT lung cancer screen  Continue your blood pressure and other medicaitons as usual   Shingles vaccine:  I recommend you have a shingles vaccine to help prevent shingles or herpes zoster outbreak.   Please call your insurer to inquire about coverage for the Shingrix vaccine given in 2 doses.   Some insurers cover this vaccine after age 74, some cover this after age 21.  If your insurer covers this, then call to schedule appointment to have this vaccine here.

## 2023-03-14 ENCOUNTER — Telehealth: Payer: Self-pay | Admitting: Medical

## 2023-03-14 NOTE — Telephone Encounter (Signed)
[facility] pharmacy tech called checking on status of prior auth informed her that this was being worked on,

## 2023-03-15 ENCOUNTER — Other Ambulatory Visit (HOSPITAL_COMMUNITY): Payer: Self-pay

## 2023-03-15 ENCOUNTER — Telehealth: Payer: Self-pay

## 2023-03-15 NOTE — Telephone Encounter (Signed)
 I have not received a request to do a Prior Authorization on any medication for this patient. Please Specify which medication you need a Prior Authorization on.   Also,  In the future, please remember to send to the Rx Prior Auth pool to avoid delays in patient care. Thank you.

## 2023-03-15 NOTE — Telephone Encounter (Signed)
 Pharmacy Patient Advocate Encounter  Received notification from [employer] that Prior Authorization for Repatha  has been APPROVED from 1.1.24 to 12.31.25. Ran test claim, Copay is $45.00. This test claim was processed through [facility]- copay amounts may vary at other pharmacies due to pharmacy/plan contracts, or as the patient moves through the different stages of their insurance plan.   PA #/Case ID/Reference #: (Key: [id number])  Please note that this P/A is effective tomorrow. However Tomorrow is a Holiday and I was informed that the pts pharmacy will be closed all day tomorrow. The patient can pickup on Thursday or choose to go to the [facility] on [address] to be filled.

## 2023-03-15 NOTE — Telephone Encounter (Signed)
PA needs to be done on Repatha per patient

## 2023-03-16 ENCOUNTER — Other Ambulatory Visit: Payer: Self-pay | Admitting: Medical

## 2023-04-06 ENCOUNTER — Other Ambulatory Visit: Payer: Self-pay | Admitting: Medical

## 2023-04-06 ENCOUNTER — Ambulatory Visit (INDEPENDENT_AMBULATORY_CARE_PROVIDER_SITE_OTHER): Payer: Medicare (Managed Care) | Admitting: Medical

## 2023-04-06 VITALS — BP 120/70 | HR 75 | Temp 97.1°F | Wt 166.8 lb

## 2023-04-06 DIAGNOSIS — G8929 Other chronic pain: Secondary | ICD-10-CM | POA: Diagnosis not present

## 2023-04-06 DIAGNOSIS — M5432 Sciatica, left side: Secondary | ICD-10-CM

## 2023-04-06 DIAGNOSIS — M62838 Other muscle spasm: Secondary | ICD-10-CM

## 2023-04-06 DIAGNOSIS — M5442 Lumbago with sciatica, left side: Secondary | ICD-10-CM | POA: Diagnosis not present

## 2023-04-06 DIAGNOSIS — M79672 Pain in left foot: Secondary | ICD-10-CM | POA: Diagnosis not present

## 2023-04-06 LAB — POCT URINALYSIS DIP (PROADVANTAGE DEVICE)
Bilirubin, UA: NEGATIVE
Blood, UA: NEGATIVE
Glucose, UA: NEGATIVE mg/dL
Nitrite, UA: NEGATIVE
Protein Ur, POC: NEGATIVE mg/dL
Specific Gravity, Urine: 1.025
Urobilinogen, Ur: NEGATIVE
pH, UA: 6 (ref 5.0–8.0)

## 2023-04-06 MED ORDER — HYDROCODONE-ACETAMINOPHEN 5-325 MG PO TABS: 1.0000 | ORAL_TABLET | Freq: Four times a day (QID) | ORAL | 0 refills | Status: DC | PRN

## 2023-04-06 NOTE — Patient Instructions (Addendum)
Recommendations: You can use your muscle relaxer twice a day for the next few days when home.  Caution with sedation with this medication Go ahead and do your Naprosyn twice daily the next 5 days since you are only using this once a day lately You can begin hydrocodone pain medicine 2 or 3 times a day maximum over the next few days but space this out at least 4 hours from your muscle relaxer Over the next few nights sleep with your knees in a bent position to take some pressure off the low back Use stretching the neck several days You can use some muscle rub such as Biofreeze or other topical muscle rubs the next few days If worse or no improvement by Monday then let me know as we could do an updated x-ray or potentially other evaluation if needed    DENTIST RECOMMENDATION Dr. Yancey Flemings, dentist [address], [place], [place] [address] [phone] [email or web address]

## 2023-04-06 NOTE — Addendum Note (Signed)
Addended by: Herminio Commons A on: 04/06/2023 04:29 PM   Modules accepted: Orders

## 2023-04-06 NOTE — Progress Notes (Signed)
Subjective:  Desiree Thomas is a 63 y.o. female who presents for Chief Complaint  Patient presents with   left back pain    Left back pain down to left foot pain. sciatica     Here for flare up of pain.   Been having a few weeks of worse pain in left low back radiating down left leg.   Back been hurting consistently.   Having some pain in left foot as well. Has had some tinging and numbness lately in left leg.  No recent injury trauma or fall.  She does work.  She does move and lift things at work.  No recent were strenuous activity.  Has hx/o 2 prior back surgeries. Not sure if the cold weather is aggravating things.  No urinary frequency, no urgency, no blood or odor in urine.  Was on antibiotics about 6 weeks ago when she had oral surgery.  Using probiotic regularly.   No vaginal discharge.    No other aggravating or relieving factors.    No other c/o.  Past Medical History:  Diagnosis Date   Allergy    Arthritis    Asthma    no inhaler - as a young adult, no current problems   Chronic back pain    Depression    GERD (gastroesophageal reflux disease)    no meds, diet controlled   Hearing loss    45% on right ear   History of meningitis    1992   Hyperlipidemia    Hypertension    Migraine    Current Outpatient Medications on File Prior to Visit  Medication Sig Dispense Refill   Evolocumab (REPATHA SURECLICK) 140 MG/ML SOAJ Inject 140 mg into the skin every 14 (fourteen) days. 2 mL 11   FLUoxetine (PROZAC) 20 MG capsule TAKE 1 CAPSULE(20 MG) BY MOUTH DAILY 90 capsule 1   loratadine (CLARITIN) 10 MG tablet Take 10 mg by mouth daily.     magnesium gluconate (MAGONATE) 500 MG tablet Take 500 mg by mouth daily.     methocarbamol (ROBAXIN) 750 MG tablet Take 1 tablet (750 mg total) by mouth 2 (two) times daily as needed for muscle spasms. 60 tablet 2   Multiple Vitamins-Minerals (MULTIVITAMIN WITH MINERALS) tablet Take 1 tablet by mouth daily.     naproxen (NAPROSYN) 375 MG  tablet TAKE 1 TABLET(375 MG) BY MOUTH TWICE DAILY WITH A MEAL 90 tablet 0   ondansetron (ZOFRAN-ODT) 8 MG disintegrating tablet Take 8 mg by mouth every 8 (eight) hours as needed for nausea or vomiting.     pregabalin (LYRICA) 150 MG capsule TAKE 1 CAPSULE BY MOUTH THREE TIMES DAILY. TAKE 1 EVERY MORNING, 1 EVERY AFTERNOON AND 1 EVERY EVENING 90 capsule 1   propranolol ER (INDERAL LA) 60 MG 24 hr capsule TAKE 1 CAPSULE(60 MG) BY MOUTH AT BEDTIME 90 capsule 1   QUEtiapine (SEROQUEL) 25 MG tablet TAKE 1 TABLET(25 MG) BY MOUTH AT BEDTIME AS NEEDED FOR SLEEP 90 tablet 1   rizatriptan (MAXALT) 5 MG tablet Take 5 mg by mouth as needed for migraine. May repeat in 2 hours if needed     solifenacin (VESICARE) 5 MG tablet TAKE 1 TABLET(5 MG) BY MOUTH DAILY 90 tablet 2   trolamine salicylate (ASPERCREME) 10 % cream Apply 1 Application topically as needed for muscle pain.     No current facility-administered medications on file prior to visit.   Past Surgical History:  Procedure Laterality Date   ABDOMINAL HYSTERECTOMY  still has ovaries   ANTERIOR LAT LUMBAR FUSION Left 02/18/2022   Procedure: LEFT-SIDED LUMBAR 3 - LUMBAR 4 LATERAL INTERBODY FUSION WITH INSTRUMENTATION AND ALLOGRAFT;  Surgeon: Estill Bamberg, MD;  Location: [facility] OR;  Service: Orthopedics;  Laterality: Left;   APPENDECTOMY     CESAREAN SECTION     x2   COLONOSCOPY  2019   cyst removed from ankle     KNEE SURGERY  02/2013   KNEE SURGERY Bilateral    arthroscopy   LUMBAR FUSION  02/2014   LUMBAR SPINE SURGERY     fusion   trigger finger of thumb Bilateral    TRIGGER FINGER RELEASE Right 09/21/2021   Procedure: RIGHT RING AND SMALL TRIGGER FINGER RELEASE and INJECTIONS OF RIGHT INDEX AND LONG FINGER;  Surgeon: Ernestina Columbia, MD;  Location: [facility] OR;  Service: Orthopedics;  Laterality: Right;    The following portions of the patient's history were reviewed and updated as appropriate: allergies, current medications, past family  history, past medical history, past social history, past surgical history and problem list.  ROS Otherwise as in subjective above    Objective: BP 120/70   Pulse 75   Temp (!) 97.1 F (36.2 C)   Wt 166 lb 12.8 oz (75.7 kg)   BMI 30.02 kg/m   Wt Readings from Last 3 Encounters:  04/06/23 166 lb 12.8 oz (75.7 kg)  03/11/23 166 lb 12.8 oz (75.7 kg)  01/25/23 166 lb (75.3 kg)   General appearance: alert, no distress, well developed, well nourished Back tender in the left mid to lower back paraspinally, no obvious swelling, no rash, no deformity otherwise, otherwise back nontender, lumbar surgical scar noted, mild pain with range of motion but range of motion is full MSK: Mild pain in the left mid to low back with hip range of motion but no hip pain per se,, she has some mild tenderness in the left foot laterally over the fourth and fifth metatarsals but no deformity no swelling, rest of legs unremarkable Pulses: 2+ radial pulses, 2+ pedal pulses, normal cap refill Ext: no edema Leg strength seems normal    Assessment: Encounter Diagnoses  Name Primary?   Sciatica of left side Yes   Chronic left-sided low back pain with left-sided sciatica    Muscle spasm    Left foot pain      Plan: Discussed symptoms, findings, recommendations  Recommendations: You can use your muscle relaxer twice a day for the next few days when home.  Caution with sedation with this medication Go ahead and do your Naprosyn twice daily the next 5 days since you are only using this once a day lately You can begin hydrocodone pain medicine 2 or 3 times a day maximum over the next few days but space this out at least 4 hours from your muscle relaxer Over the next few nights sleep with your knees in a bent position to take some pressure off the low back Use stretching the neck several days You can use some muscle rub such as Biofreeze or other topical muscle rubs the next few days If worse or no  improvement by Monday then let me know as we could do an updated x-ray or potentially other evaluation if needed  Desiree Thomas was seen today for left back pain.  Diagnoses and all orders for this visit:  Sciatica of left side  Chronic left-sided low back pain with left-sided sciatica  Muscle spasm  Left foot pain  Other orders -  HYDROcodone-acetaminophen (NORCO) 5-325 MG tablet; Take 1 tablet by mouth every 6 (six) hours as needed.   Follow up: prn

## 2023-04-11 ENCOUNTER — Telehealth: Payer: Self-pay | Admitting: Medical

## 2023-04-11 NOTE — Telephone Encounter (Signed)
Pt called states that she is still having bad back pain, the pain medicine helps but when she takes it its right back, she wants to know what she can do next Pt can be reached at  [phone] (M)  Please send back to someone else as I will not be here after today

## 2023-04-12 ENCOUNTER — Encounter: Payer: Self-pay | Admitting: Medical

## 2023-04-12 NOTE — Telephone Encounter (Signed)
Pt was notified.

## 2023-04-16 ENCOUNTER — Other Ambulatory Visit: Payer: Self-pay | Admitting: Medical

## 2023-05-04 ENCOUNTER — Other Ambulatory Visit: Payer: Self-pay | Admitting: Medical

## 2023-05-04 MED ORDER — PRALUENT 150 MG/ML ~~LOC~~ SOAJ: 150.0000 mg | SUBCUTANEOUS | 11 refills | Status: DC

## 2023-05-04 NOTE — Telephone Encounter (Signed)
I got a letter that her insurance was not going to cover Repatha anymore  She can certainly call and complaint to her insurance company about why they are not covering a medicine that is actually working for her well that she has been taking without side effects for the past year  It is obvious that the insurance companies' first priority is not her health but saving money  I did send a new prescription for a medicine called Praluent which is similar to the Repatha.  Over this will be covered by insurance.   it is a similar every 2-week injection

## 2023-05-11 ENCOUNTER — Ambulatory Visit: Payer: Self-pay | Admitting: Medical

## 2023-05-11 NOTE — Telephone Encounter (Signed)
 Chief Complaint: Worsened left hip pain Symptoms: Left hip pain/burning/swelling Frequency: > 1 month Pertinent Negatives: Patient denies numbness/weakness in legs Disposition: [] ED /[] Urgent Care (no appt availability in office) / [x] Appointment(In office/virtual)/ []  [facility]/ [] Home Care/ [] Refused Recommended Disposition /[] [facility]/ []  Follow-up with PCP Additional Notes: Patient called in stating she is having worsening of left hip pain. Patient states she was evaluated by Dr. Aleen Campi and giving pain medication that is not helping, and she now is having worsened pain/burning and new swelling. Patient appt made for follow up evaluation.    Copied from CRM [id number]. Topic: Clinical - Red Word Triage >> May 11, 2023 11:22 AM Payton Doughty wrote: Red Word that prompted transfer to Nurse Triage: Left side pain- worsening intensifying Reason for Disposition  [1] MODERATE pain (e.g., interferes with normal activities, limping) AND [2] present > 3 days  Answer Assessment - Initial Assessment Questions 1. LOCATION and RADIATION: "Where is the pain located?"      Left side/right above pain 2. QUALITY: "What does the pain feel like?"  (e.g., sharp, dull, aching, burning)     Burning, throbbing 3. SEVERITY: "How bad is the pain?" "What does it keep you from doing?"   (Scale 1-10; or mild, moderate, severe)   -  MILD (1-3): doesn't interfere with normal activities    -  MODERATE (4-7): interferes with normal activities (e.g., work or school) or awakens from sleep, limping    -  SEVERE (8-10): excruciating pain, unable to do any normal activities, unable to walk     7-8 4. ONSET: "When did the pain start?" "Does it come and go, or is it there all the time?"     > 1 month 5. WORK OR EXERCISE: "Has there been any recent work or exercise that involved this part of the body?"      No 6. CAUSE: "What do you think is causing the hip pain?"      No 7. AGGRAVATING FACTORS: "What  makes the hip pain worse?" (e.g., walking, climbing stairs, running)     Exertion makes it worse 8. OTHER SYMPTOMS: "Do you have any other symptoms?" (e.g., back pain, pain shooting down leg,  fever, rash)     Swelling  Protocols used: Hip Pain-A-AH

## 2023-05-12 ENCOUNTER — Ambulatory Visit (INDEPENDENT_AMBULATORY_CARE_PROVIDER_SITE_OTHER): Payer: Medicare (Managed Care) | Admitting: Medical

## 2023-05-12 VITALS — BP 110/72 | HR 67 | Wt 166.8 lb

## 2023-05-12 DIAGNOSIS — M7918 Myalgia, other site: Secondary | ICD-10-CM | POA: Diagnosis not present

## 2023-05-12 DIAGNOSIS — M25552 Pain in left hip: Secondary | ICD-10-CM

## 2023-05-12 DIAGNOSIS — M545 Low back pain, unspecified: Secondary | ICD-10-CM | POA: Diagnosis not present

## 2023-05-12 DIAGNOSIS — G8929 Other chronic pain: Secondary | ICD-10-CM | POA: Diagnosis not present

## 2023-05-12 MED ORDER — HYDROCODONE-ACETAMINOPHEN 5-325 MG PO TABS: 1.0000 | ORAL_TABLET | Freq: Four times a day (QID) | ORAL | 0 refills | Status: DC | PRN

## 2023-05-12 NOTE — Patient Instructions (Signed)
 Piriformis Syndrome Rehab Ask your health care provider which exercises are safe for you. Do exercises exactly as told by your health care provider and adjust them as directed. It is normal to feel mild stretching, pulling, tightness, or discomfort as you do these exercises. Stop right away if you feel sudden pain or your pain gets worse. Do not begin these exercises until told by your health care provider. Stretching and range-of-motion exercises These exercises warm up your muscles and joints and improve the movement and flexibility of your hip and pelvis. The exercises also help to relieve pain, numbness, and tingling. Nerve root  Sit on a firm surface that is high enough that you can swing your left / right foot freely. Place a folded towel under your left / right thigh. This is optional. Drop your head forward and round your back. While you keep your left / right foot relaxed, slowly straighten your left / right knee until you feel a slight pull behind your knee or calf. If your leg is fully extended and you still do not feel a pull, slowly tilt your foot and toes toward you. Hold this position for __________ seconds. Slowly return your knee to its starting position. Hip rotation This is an exercise in which you lie on your back and stretch the muscles that rotate your hip (hip rotators) to stretch your buttocks. Lie on your back on a firm surface. Pull your left / right knee toward your same shoulder with your left / right hand until your knee is pointing toward the ceiling. Hold your left / right ankle with your other hand. Keeping your knee steady, gently pull your left / right ankle toward your other shoulder until you feel a stretch in your buttocks. Hold this position for __________ seconds. Repeat __________ times. Complete this exercise __________ times a day. Hip extensor This is an exercise in which you lie on your back and pull your knee to your chest. Lie on your back on a firm  surface. Both of your legs should be straight. Pull your left / right knee to your chest. Hold your leg in this position by holding on to the back of your thigh or the front of your knee. Hold this position for __________ seconds. Slowly return to the starting position. Repeat __________ times. Complete this exercise __________ times a day. Strengthening exercises These exercises build strength and endurance in your hip and thigh muscles. Endurance is the ability to use your muscles for a long time, even after they get tired. Straight leg raises, side-lying This exercise strengthens the muscles that rotate the leg at the hip and move it away from your body (hip abductors). Lie on your side with your left / right leg in the top position. Lie so your head, shoulder, knee, and hip line up. Bend your bottom knee to help you balance. Lift your top leg 4-6 inches (10-15 cm) while keeping your toes pointed straight ahead. Hold this position for __________ seconds. Slowly lower your leg to the starting position. Let your muscles relax completely after each repetition. Repeat __________ times. Complete this exercise __________ times a day. Hip abduction and rotation This is sometimes called quadruped (on hands and knees) exercises. Get on your hands and knees on a firm, lightly padded surface. Your hands should be directly below your shoulders, and your knees should be directly below your hips. Lift your left / right knee out to the side. Keep your knee bent. Do not twist  your body. Hold this position for __________ seconds. Slowly lower your leg. Repeat __________ times. Complete this exercise __________ times a day. Straight leg raises, prone This exercise stretches the muscles that move the hips (hip extensors). Lie on your abdomen on a firm surface (prone position). Tense the muscles in your buttocks and lift your left / right leg about 4 inches (10 cm). Keep your knee straight as you lift your  leg. If you cannot lift your leg that high without arching your back, place a pillow under your hips. Hold this position for __________ seconds. Slowly lower your leg to the starting position. Let your muscles relax completely after each repetition. Repeat __________ times. Complete this exercise __________ times a day. This information is not intended to replace advice given to you by your health care provider. Make sure you discuss any questions you have with your health care provider. Document Revised: 09/02/2020 Document Reviewed: 09/02/2020 Elsevier Patient Education  2024 [employer].

## 2023-05-12 NOTE — Progress Notes (Signed)
 Subjective:  Desiree Thomas is a 63 y.o. female who presents for Chief Complaint  Patient presents with   left side pain    Left side pain since january     Here for recheck on left sided pain.  I saw her on April 06, 2023 for similar.  She really does not feel much improvement since that day.  She did get some improvement with muscle laxer and pain medicine.  She is going out of town tomorrow and would like a refill on some medicine to help as she will be sore from being in the car so long going up to [place].  Her pains are basically still left low back, left hip, left buttock, radiating down the left leg.  Burning pain at times.  No new numbness or tingling then.  No incontinence.  No fever.  No recent fall trauma or injury.  She notes that she fell in October bruising her left arm and left leg.  Fell onto R side.  She has had some of these pains ever since then.  No other aggravating or relieving factors.    No other c/o.  Past Medical History:  Diagnosis Date   Allergy    Arthritis    Asthma    no inhaler - as a young adult, no current problems   Chronic back pain    Depression    GERD (gastroesophageal reflux disease)    no meds, diet controlled   Hearing loss    45% on right ear   History of meningitis    1992   Hyperlipidemia    Hypertension    Migraine    Current Outpatient Medications on File Prior to Visit  Medication Sig Dispense Refill   amLODipine (NORVASC) 10 MG tablet TAKE 1 TABLET(10 MG) BY MOUTH DAILY 90 tablet 1   aspirin EC 81 MG tablet Take 81 mg by mouth daily. Swallow whole.     atorvastatin (LIPITOR) 40 MG tablet TAKE 1 TABLET(40 MG) BY MOUTH DAILY 90 tablet 1   FLUoxetine (PROZAC) 20 MG capsule TAKE 1 CAPSULE(20 MG) BY MOUTH DAILY 90 capsule 1   loratadine (CLARITIN) 10 MG tablet Take 10 mg by mouth daily.     magnesium gluconate (MAGONATE) 500 MG tablet Take 500 mg by mouth daily.     methocarbamol (ROBAXIN) 750 MG tablet TAKE 1 TABLET(750 MG) BY  MOUTH TWICE DAILY AS NEEDED FOR MUSCLE SPASMS 60 tablet 2   Multiple Vitamins-Minerals (MULTIVITAMIN WITH MINERALS) tablet Take 1 tablet by mouth daily.     naproxen (NAPROSYN) 375 MG tablet TAKE 1 TABLET(375 MG) BY MOUTH TWICE DAILY WITH A MEAL 90 tablet 0   ondansetron (ZOFRAN-ODT) 8 MG disintegrating tablet Take 8 mg by mouth every 8 (eight) hours as needed for nausea or vomiting.     pregabalin (LYRICA) 150 MG capsule TAKE 1 CAPSULE BY MOUTH THREE TIMES DAILY. TAKE 1 EVERY MORNING, 1 EVERY AFTERNOON AND 1 EVERY EVENING 90 capsule 1   propranolol ER (INDERAL LA) 60 MG 24 hr capsule TAKE 1 CAPSULE(60 MG) BY MOUTH AT BEDTIME 90 capsule 1   QUEtiapine (SEROQUEL) 25 MG tablet TAKE 1 TABLET(25 MG) BY MOUTH AT BEDTIME AS NEEDED FOR SLEEP 90 tablet 1   rizatriptan (MAXALT) 5 MG tablet Take 5 mg by mouth as needed for migraine. May repeat in 2 hours if needed     solifenacin (VESICARE) 5 MG tablet TAKE 1 TABLET(5 MG) BY MOUTH DAILY 90 tablet 2   trolamine  salicylate (ASPERCREME) 10 % cream Apply 1 Application topically as needed for muscle pain.     Alirocumab (PRALUENT) 150 MG/ML SOAJ Inject 1 mL (150 mg total) into the skin every 14 (fourteen) days. (Patient not taking: Reported on 05/12/2023) 2 mL 11   Evolocumab (REPATHA SURECLICK) 140 MG/ML SOAJ Inject 140 mg into the skin every 14 (fourteen) days. (Patient not taking: Reported on 05/12/2023) 2 mL 11   No current facility-administered medications on file prior to visit.   Past Surgical History:  Procedure Laterality Date   ABDOMINAL HYSTERECTOMY     still has ovaries   ANTERIOR LAT LUMBAR FUSION Left 02/18/2022   Procedure: LEFT-SIDED LUMBAR 3 - LUMBAR 4 LATERAL INTERBODY FUSION WITH INSTRUMENTATION AND ALLOGRAFT;  Surgeon: Estill Bamberg, MD;  Location: [facility] OR;  Service: Orthopedics;  Laterality: Left;   APPENDECTOMY     CESAREAN SECTION     x2   COLONOSCOPY  2019   cyst removed from ankle     KNEE SURGERY  02/2013   KNEE SURGERY Bilateral     arthroscopy   LUMBAR FUSION  02/2014   LUMBAR SPINE SURGERY     fusion   trigger finger of thumb Bilateral    TRIGGER FINGER RELEASE Right 09/21/2021   Procedure: RIGHT RING AND SMALL TRIGGER FINGER RELEASE and INJECTIONS OF RIGHT INDEX AND LONG FINGER;  Surgeon: Ernestina Columbia, MD;  Location: [facility] OR;  Service: Orthopedics;  Laterality: Right;    The following portions of the patient's history were reviewed and updated as appropriate: allergies, current medications, past family history, past medical history, past social history, past surgical history and problem list.  ROS Otherwise as in subjective above    Objective: BP 110/72   Pulse 67   Wt 166 lb 12.8 oz (75.7 kg)   BMI 30.02 kg/m   Wt Readings from Last 3 Encounters:  05/12/23 166 lb 12.8 oz (75.7 kg)  04/06/23 166 lb 12.8 oz (75.7 kg)  03/11/23 166 lb 12.8 oz (75.7 kg)   General appearance: alert, no distress, well developed, well nourished Back range of motion about 80% of normal with some pain noted with left hip motion. Today she is specifically more tender in the left buttock and somewhat laterally of the flank, tender superior lateral left thigh, and some tenderness in the left low back as well.  Tender over the piriformis.  Hip range of motion seems normal.  No tenderness over the left trochanter or left hip specifically Negative straight leg raise, normal lower extremity strength and sensation Lower extremity pulses within normal limits No lower extremity edema    Assessment: Encounter Diagnoses  Name Primary?   Chronic left-sided low back pain, unspecified whether sciatica present Yes   Piriformis muscle pain    Buttock pain    Pain of left hip       Plan: We discussed her symptoms today do not suggest a radicular issue.  She had successful back surgery around 1 year ago.  There seems to be concern for back spasm, low back strain, buttock pain, and possible piriformis issue.  Referral to physical  therapy.  She can continue Aleve over-the-counter as needed, pain medicine below as needed sparingly, Robaxin as needed, heat.  We demonstrated several stretches she can do to help with her issues as well  Giordana was seen today for left side pain.  Diagnoses and all orders for this visit:  Chronic left-sided low back pain, unspecified whether sciatica present -  Ambulatory referral to Physical Therapy  Piriformis muscle pain -     Ambulatory referral to Physical Therapy  Buttock pain -     Ambulatory referral to Physical Therapy  Pain of left hip -     Ambulatory referral to Physical Therapy  Other orders -     HYDROcodone-acetaminophen (NORCO) 5-325 MG tablet; Take 1 tablet by mouth every 6 (six) hours as needed.    Follow up: pending PT

## 2023-05-13 ENCOUNTER — Other Ambulatory Visit: Payer: Self-pay | Admitting: Medical

## 2023-05-13 NOTE — Telephone Encounter (Signed)
 Last apt 05/12/23

## 2023-05-16 ENCOUNTER — Telehealth: Payer: Self-pay | Admitting: Internal Medicine

## 2023-05-16 NOTE — Telephone Encounter (Signed)
 Copied from CRM [id number]. Topic: Referral - Status >> May 16, 2023  8:28 AM Carlatta H wrote: Reason for CRM: Call to advised that the patients insurance is not accepted will need to be sent somewhere else

## 2023-05-16 NOTE — Telephone Encounter (Signed)
 I believe this is for the the referral to PT

## 2023-05-17 ENCOUNTER — Other Ambulatory Visit: Payer: Self-pay | Admitting: Medical

## 2023-05-17 ENCOUNTER — Telehealth: Payer: Self-pay | Admitting: Medical

## 2023-05-17 MED ORDER — TIZANIDINE HCL 4 MG PO TABS: 4.0000 mg | ORAL_TABLET | Freq: Two times a day (BID) | ORAL | 1 refills | Status: DC | PRN

## 2023-05-17 NOTE — Telephone Encounter (Signed)
 I got a notice from insurance that Robaxin is not the preferred muscle relaxer so tizanidine was sent as an alternate

## 2023-05-18 NOTE — Telephone Encounter (Signed)
 Left detailed message about alternative being sent in instead

## 2023-05-19 ENCOUNTER — Other Ambulatory Visit: Payer: Self-pay | Admitting: Medical

## 2023-05-23 ENCOUNTER — Other Ambulatory Visit: Payer: Self-pay | Admitting: Medical

## 2023-05-27 ENCOUNTER — Other Ambulatory Visit: Payer: Self-pay | Admitting: Medical

## 2023-05-27 NOTE — Telephone Encounter (Signed)
 Last apt 03/11/23

## 2023-06-10 ENCOUNTER — Ambulatory Visit: Payer: Self-pay

## 2023-06-10 NOTE — Telephone Encounter (Signed)
 Called patient and let her know that w/o being seen provider could not call any medication in. Options at this point are UC, MyChart virtual visit through [facility] or visit here Monday.

## 2023-06-10 NOTE — Telephone Encounter (Signed)
  Chief Complaint: urinary urgency Symptoms: back pain, urinary urgency, positive UTI home test Frequency: started 1.5 weeks ago Pertinent Negatives: Patient denies fever, CP, SOB Disposition: [] ED /[] Urgent Care (no appt availability in office) / [] Appointment(In office/virtual)/ []  [place] [facility]/ [] Home Care/ [] Refused Recommended Disposition /[] [place] [facility]/ [x]  Follow-up with PCP Additional Notes: patient calling with positive home UTI test. Patient was unable to say what popped up positive but said that the test said she was positive for UTI. Patient endorses urinary urgency and back pain. Patient states this has been going on for 1.5 weeks. Per protocol, patient recommended to be seen in 24 hours. Patient is asking to speak to PCP to see if she can get an antibiotic sent in or if she needs to be seen in the office. No availability that this RN can see. Patient is asking for PCP staff to follow up with her. Patient verbalized understanding of plan and all questions answered.     Copied from CRM [id number]. Topic: Clinical - Red Word Triage >> Jun 10, 2023 10:11 AM Emylou G wrote: [facility] that prompted transfer to Nurse Triage: constant urge to pee.. irritation in her vagina.. Reason for Disposition  Side (flank) or lower back pain present  Answer Assessment - Initial Assessment Questions 1. SYMPTOM: "What's the main symptom you're concerned about?" (e.g., frequency, incontinence)     Urinary urgency 2. ONSET: "When did the  urinary urgency  start?"     Week and a half ago 3. PAIN: "Is there any pain?" If Yes, ask: "How bad is it?" (Scale: 1-10; mild, moderate, severe)     6 out of 10 4. CAUSE: "What do you think is causing the symptoms?"     UTI-positive home test 5. OTHER SYMPTOMS: "Do you have any other symptoms?" (e.g., blood in urine, fever, flank pain, pain with urination)     Pain with urination, flank pain  Protocols used: Urinary Symptoms-A-AH

## 2023-06-13 ENCOUNTER — Ambulatory Visit: Payer: Medicare (Managed Care) | Admitting: Medical

## 2023-06-13 VITALS — BP 120/80 | HR 63 | Temp 98.4°F | Wt 160.0 lb

## 2023-06-13 DIAGNOSIS — R300 Dysuria: Secondary | ICD-10-CM

## 2023-06-13 DIAGNOSIS — M5416 Radiculopathy, lumbar region: Secondary | ICD-10-CM | POA: Diagnosis not present

## 2023-06-13 DIAGNOSIS — R350 Frequency of micturition: Secondary | ICD-10-CM | POA: Diagnosis not present

## 2023-06-13 DIAGNOSIS — M545 Low back pain, unspecified: Secondary | ICD-10-CM | POA: Diagnosis not present

## 2023-06-13 LAB — POCT URINALYSIS DIP (PROADVANTAGE DEVICE)
Bilirubin, UA: NEGATIVE
Blood, UA: NEGATIVE
Glucose, UA: NEGATIVE mg/dL
Ketones, POC UA: NEGATIVE mg/dL
Nitrite, UA: NEGATIVE
Protein Ur, POC: NEGATIVE mg/dL
Specific Gravity, Urine: 1.015
Urobilinogen, Ur: NEGATIVE
pH, UA: 7 (ref 5.0–8.0)

## 2023-06-13 MED ORDER — FLUCONAZOLE 150 MG PO TABS: 150.0000 mg | ORAL_TABLET | ORAL | 0 refills | Status: DC

## 2023-06-13 MED ORDER — REPATHA SURECLICK 140 MG/ML ~~LOC~~ SOAJ: 140.0000 mg | SUBCUTANEOUS | 8 refills | Status: DC

## 2023-06-13 MED ORDER — SULFAMETHOXAZOLE-TRIMETHOPRIM 800-160 MG PO TABS: 1.0000 | ORAL_TABLET | Freq: Two times a day (BID) | ORAL | 0 refills | Status: DC

## 2023-06-13 NOTE — Progress Notes (Signed)
 Subjective:   Desiree Thomas is a 63 y.o. female who complains of possible urinary tract infection.   Chief Complaint  Patient presents with   UTI    UTI- symptoms- frequent urination, urgency, painful, red inflammed, took  a test on UTI and positive X a couple weeks   Here for possible UTI.  Symptoms began a few weeks ago.  Having ongoing urinary frequency, irritation, burning, urgency, but no blood.  Has had occasional chills, body aches, but no fever.     Has some constipation but no other bowel issues.    Been drinking more lemonade and that has been giving her symptoms.  Last UTI 2024.  Patient does not have a history of recurrent UTI.   Patient does not have a history of pyelonephritis.    They deny any recent change in soap or hygiene products.   They deny vaginal discharge or suspected genital infection.  No other aggravating or relieving factors.  No other c/o.  Past Medical History:  Diagnosis Date   Allergy    Arthritis    Asthma    no inhaler - as a young adult, no current problems   Chronic back pain    Depression    GERD (gastroesophageal reflux disease)    no meds, diet controlled   Hearing loss    45% on right ear   History of meningitis    1992   Hyperlipidemia    Hypertension    Migraine     Current Outpatient Medications on File Prior to Visit  Medication Sig Dispense Refill   Alirocumab (PRALUENT) 150 MG/ML SOAJ Inject 1 mL (150 mg total) into the skin every 14 (fourteen) days. 2 mL 11   amLODipine (NORVASC) 10 MG tablet TAKE 1 TABLET(10 MG) BY MOUTH DAILY 90 tablet 1   aspirin EC 81 MG tablet Take 81 mg by mouth daily. Swallow whole.     atorvastatin (LIPITOR) 40 MG tablet TAKE 1 TABLET(40 MG) BY MOUTH DAILY 90 tablet 1   FLUoxetine (PROZAC) 20 MG capsule TAKE 1 CAPSULE(20 MG) BY MOUTH DAILY 90 capsule 1   HYDROcodone-acetaminophen (NORCO) 5-325 MG tablet Take 1 tablet by mouth every 6 (six) hours as needed. 12 tablet 0   loratadine (CLARITIN)  10 MG tablet Take 10 mg by mouth daily.     magnesium gluconate (MAGONATE) 500 MG tablet Take 500 mg by mouth daily.     methocarbamol (ROBAXIN) 750 MG tablet Take 750 mg by mouth 2 (two) times daily.     Multiple Vitamins-Minerals (MULTIVITAMIN WITH MINERALS) tablet Take 1 tablet by mouth daily.     naproxen (NAPROSYN) 375 MG tablet TAKE 1 TABLET(375 MG) BY MOUTH TWICE DAILY WITH A MEAL 90 tablet 0   ondansetron (ZOFRAN-ODT) 8 MG disintegrating tablet Take 8 mg by mouth every 8 (eight) hours as needed for nausea or vomiting.     pregabalin (LYRICA) 150 MG capsule TAKE 1 CAPSULE BY MOUTH EVERY MORNING, 1 CAPSULE EVERY AFTERNOON, AND 1 CAPSULE EVERY EVENING 90 capsule 5   propranolol ER (INDERAL LA) 60 MG 24 hr capsule TAKE 1 CAPSULE(60 MG) BY MOUTH AT BEDTIME 90 capsule 0   QUEtiapine (SEROQUEL) 25 MG tablet TAKE 1 TABLET(25 MG) BY MOUTH AT BEDTIME AS NEEDED FOR SLEEP 90 tablet 1   rizatriptan (MAXALT) 5 MG tablet Take 5 mg by mouth as needed for migraine. May repeat in 2 hours if needed     solifenacin (VESICARE) 5 MG tablet TAKE 1 TABLET(5  MG) BY MOUTH DAILY 90 tablet 2   trolamine salicylate (ASPERCREME) 10 % cream Apply 1 Application topically as needed for muscle pain.     No current facility-administered medications on file prior to visit.    ROS as in subjective  Reviewed allergies, medications, past medical, surgical, and social history.     Objective: BP 120/80   Pulse 63   Temp 98.4 F (36.9 C)   Wt 160 lb (72.6 kg)   BMI 28.80 kg/m   General appearance: alert, no distress, WD/WN, female Abdomen: +bs, soft, mild suprapubic tendnerss, otherwise non tender, non distended, no masses, no hepatomegaly, no splenomegaly, no bruits Back: no CVA tenderness GU: deferred, declined    Assessment: Encounter Diagnoses  Name Primary?   Burning with urination Yes   Frequent urination    Dysuria      Plan: Discussed symptoms  Urinary Tract Infection You are being treated  for a urinary tract infection. Antibiotic prescribed today is bactrim.  Make sure you complete the antibiotic. Hydrate well with water to help flush the infection from the body.  Drink at least 64 oz of water daily. You can use either cranberry juice or cranberry tablets the next few days to alter the pH of the urine and help clear the infection We will call with the results of the urine culture.  It usually takes 2-3 days to get the results. If any subsequent yeast infection symptoms as discussed, can use diflucan as well I would expect you to feel much better within the next 3-4 days.  If you are not seeing improvement, or if you have worsening symptoms in the next 3-4 days, then call or return.    Desiree Thomas was seen today for uti.  Diagnoses and all orders for this visit:  Burning with urination -     POCT Urinalysis DIP (Proadvantage Device) -     Urine Culture  Frequent urination -     POCT Urinalysis DIP (Proadvantage Device) -     Urine Culture  Dysuria -     Urine Culture  Other orders -     Evolocumab (REPATHA SURECLICK) 140 MG/ML SOAJ; Inject 140 mg into the skin every 14 (fourteen) days. -     sulfamethoxazole-trimethoprim (BACTRIM DS) 800-160 MG tablet; Take 1 tablet by mouth 2 (two) times daily. -     fluconazole (DIFLUCAN) 150 MG tablet; Take 1 tablet (150 mg total) by mouth once a week.    Return pending culture.

## 2023-06-15 ENCOUNTER — Other Ambulatory Visit: Payer: Self-pay | Admitting: Orthopedic Surgery

## 2023-06-15 ENCOUNTER — Ambulatory Visit: Payer: Self-pay

## 2023-06-15 DIAGNOSIS — M5416 Radiculopathy, lumbar region: Secondary | ICD-10-CM

## 2023-06-15 LAB — URINE CULTURE

## 2023-06-15 NOTE — Telephone Encounter (Signed)
  Chief Complaint: results  Disposition: [] ED /[] Urgent Care (no appt availability in office) / [] Appointment(In office/virtual)/ []  [place] [facility]/ [] Home Care/ [] Refused Recommended Disposition /[] [place] [facility]/ [x]  Follow-up with PCP Additional Notes: patient notified:    The urine culture doesn't show infection. I wonder if there is some issues with irritation in the genitourinary area instead of actual infection. See if symptoms are any worse or any better ? Sometimes when women have symptoms of urinary tract infection, it can actually be related to strophic vaginitis changes related to menopause and dryness typically use a hormone cream for that, but we would need to do a pelvic exam as well.   Patient declines appointment today- she states she will give it  a couple days and call back for appointment if she has no improvement.    Reason for Disposition . Health Information question, no triage required and triager able to answer question  Answer Assessment - Initial Assessment Questions 1. REASON FOR CALL or QUESTION: "What is your reason for calling today?" or "How can I best help you?" or "What question do you have that I can help answer?"     Returning call -results  Patient notified per lab notes: The urine culture doesn't show infection. I wonder if there is some issues with irritation in the genitourinary area instead of actual infection. See if symptoms are any worse or any better ? Sometimes when women have symptoms of urinary tract infection, it can actually be related to strophic vaginitis changes related to menopause and dryness typically use a hormone cream for that, but we would need to do a pelvic exam as well.  Patient states she wants to wait a couple days- try what her provider recommended- she will call back for pelvic exam if she feels no change.  Protocols used: Information Only Call - No Triage-A-AH

## 2023-06-15 NOTE — Telephone Encounter (Signed)
 Patient disconnected before speaking with NT. Had questions regarding her urine culture results. Attempted to call back and received "call cannot be completed as dialed". Will continue to attempt.   Copied from CRM [id number]. Topic: Clinical - Lab/Test Results >> Jun 15, 2023 11:31 AM Geroge Baseman wrote: Reason for CRM: Patient wants to go over results

## 2023-06-20 ENCOUNTER — Other Ambulatory Visit: Payer: Self-pay | Admitting: Medical

## 2023-06-20 ENCOUNTER — Ambulatory Visit
Admission: RE | Admit: 2023-06-20 | Discharge: 2023-06-20 | Disposition: A | Discharge status: Home / Self Care | Source: Ambulatory Visit | Attending: Orthopedic Surgery | Admitting: Orthopedic Surgery

## 2023-06-20 DIAGNOSIS — M5416 Radiculopathy, lumbar region: Secondary | ICD-10-CM | POA: Diagnosis not present

## 2023-06-20 DIAGNOSIS — Z1231 Encounter for screening mammogram for malignant neoplasm of breast: Secondary | ICD-10-CM

## 2023-06-23 ENCOUNTER — Ambulatory Visit
Admission: RE | Admit: 2023-06-23 | Discharge: 2023-06-23 | Disposition: A | Discharge status: Home / Self Care | Source: Ambulatory Visit

## 2023-06-23 ENCOUNTER — Ambulatory Visit (INDEPENDENT_AMBULATORY_CARE_PROVIDER_SITE_OTHER): Admitting: Medical

## 2023-06-23 ENCOUNTER — Ambulatory Visit
Admission: RE | Admit: 2023-06-23 | Discharge: 2023-06-23 | Disposition: A | Discharge status: Home / Self Care | Source: Ambulatory Visit | Attending: Orthopedic Surgery | Admitting: Orthopedic Surgery

## 2023-06-23 VITALS — BP 120/78 | HR 64 | Wt 164.4 lb

## 2023-06-23 DIAGNOSIS — N898 Other specified noninflammatory disorders of vagina: Secondary | ICD-10-CM | POA: Diagnosis not present

## 2023-06-23 DIAGNOSIS — R350 Frequency of micturition: Secondary | ICD-10-CM | POA: Diagnosis not present

## 2023-06-23 DIAGNOSIS — R300 Dysuria: Secondary | ICD-10-CM

## 2023-06-23 DIAGNOSIS — Z1231 Encounter for screening mammogram for malignant neoplasm of breast: Secondary | ICD-10-CM

## 2023-06-23 DIAGNOSIS — N368 Other specified disorders of urethra: Secondary | ICD-10-CM

## 2023-06-23 DIAGNOSIS — M5416 Radiculopathy, lumbar region: Secondary | ICD-10-CM

## 2023-06-23 LAB — POCT WET PREP (WET MOUNT)
Clue Cells Wet Prep Whiff POC: NEGATIVE
Trichomonas Wet Prep HPF POC: ABSENT

## 2023-06-23 LAB — POCT URINALYSIS DIP (PROADVANTAGE DEVICE)
Bilirubin, UA: NEGATIVE
Blood, UA: NEGATIVE
Glucose, UA: NEGATIVE mg/dL
Ketones, POC UA: NEGATIVE mg/dL
Leukocytes, UA: NEGATIVE
Nitrite, UA: NEGATIVE
Protein Ur, POC: NEGATIVE mg/dL
Specific Gravity, Urine: 1.01
Urobilinogen, Ur: NEGATIVE
pH, UA: 7.5 (ref 5.0–8.0)

## 2023-06-23 MED ORDER — ESTRADIOL 0.1 MG/GM VA CREA: 1.0000 | TOPICAL_CREAM | VAGINAL | 1 refills | Status: DC

## 2023-06-23 NOTE — Progress Notes (Signed)
 Subjective:   Desiree Thomas is a 63 y.o. female who complains of possible urinary tract infection.   Chief Complaint  Patient presents with   Pelvic Pain    Pelvic pain, dry, no discharge   Here for ongoing urine concerns.  She came in on 06/13/2023 for possible UTI however the culture was negative.  She still has ongoing symptoms.  She notes urinary frequency, irritation, burning, urgency but no blood in the urine.  No fever.  No bodyaches or chills.  No nausea or vomiting.  No vaginal discharge.  She is married.  No concern for STD.  She does not vaginal dryness.   No intercourse in years due to husband's ED.  last Pap smear maybe 10 years ago but she has had a hysterectomy.  Patient does not have a history of recurrent UTI. Patient does not have a history of pyelonephritis.  they deny any recent change in soap or hygiene products.   She drinks 32 to 50 ounces water daily.  She has used [employer] and [employer] long-term.  No recent changes in hygiene products.  She does take Vesicare.  She was on oxybutynin in the past.  No other aggravating or relieving factors.  No other c/o.   Past Medical History:  Diagnosis Date   Allergy    Arthritis    Asthma    no inhaler - as a young adult, no current problems   Chronic back pain    Depression    GERD (gastroesophageal reflux disease)    no meds, diet controlled   Hearing loss    45% on right ear   History of meningitis    1992   Hyperlipidemia    Hypertension    Migraine    Current Outpatient Medications on File Prior to Visit  Medication Sig Dispense Refill   amLODipine (NORVASC) 10 MG tablet TAKE 1 TABLET(10 MG) BY MOUTH DAILY 90 tablet 1   aspirin EC 81 MG tablet Take 81 mg by mouth daily. Swallow whole.     atorvastatin (LIPITOR) 40 MG tablet TAKE 1 TABLET(40 MG) BY MOUTH DAILY 90 tablet 1   Evolocumab (REPATHA SURECLICK) 140 MG/ML SOAJ Inject 140 mg into the skin every 14 (fourteen) days. 2 mL 8   fluconazole (DIFLUCAN)  150 MG tablet Take 1 tablet (150 mg total) by mouth once a week. 3 tablet 0   FLUoxetine (PROZAC) 20 MG capsule TAKE 1 CAPSULE(20 MG) BY MOUTH DAILY 90 capsule 1   loratadine (CLARITIN) 10 MG tablet Take 10 mg by mouth daily.     methocarbamol (ROBAXIN) 750 MG tablet Take 750 mg by mouth 2 (two) times daily.     Multiple Vitamins-Minerals (MULTIVITAMIN WITH MINERALS) tablet Take 1 tablet by mouth daily.     naproxen (NAPROSYN) 375 MG tablet TAKE 1 TABLET(375 MG) BY MOUTH TWICE DAILY WITH A MEAL 90 tablet 0   ondansetron (ZOFRAN-ODT) 8 MG disintegrating tablet Take 8 mg by mouth every 8 (eight) hours as needed for nausea or vomiting.     pregabalin (LYRICA) 150 MG capsule TAKE 1 CAPSULE BY MOUTH EVERY MORNING, 1 CAPSULE EVERY AFTERNOON, AND 1 CAPSULE EVERY EVENING 90 capsule 5   propranolol ER (INDERAL LA) 60 MG 24 hr capsule TAKE 1 CAPSULE(60 MG) BY MOUTH AT BEDTIME 90 capsule 0   QUEtiapine (SEROQUEL) 25 MG tablet TAKE 1 TABLET(25 MG) BY MOUTH AT BEDTIME AS NEEDED FOR SLEEP 90 tablet 1   rizatriptan (MAXALT) 5 MG tablet Take  5 mg by mouth as needed for migraine. May repeat in 2 hours if needed     solifenacin (VESICARE) 5 MG tablet TAKE 1 TABLET(5 MG) BY MOUTH DAILY 90 tablet 2   trolamine salicylate (ASPERCREME) 10 % cream Apply 1 Application topically as needed for muscle pain.     No current facility-administered medications on file prior to visit.    ROS as in subjective  Reviewed allergies, medications, past medical, surgical, and social history.     Objective: BP 120/78   Pulse 64   Wt 164 lb 6.4 oz (74.6 kg)   BMI 29.59 kg/m   General appearance: alert, no distress, WD/WN, female Abdomen: +bs, soft, mild suprapubic tendnerss, otherwise non tender, non distended, no masses, no hepatomegaly, no splenomegaly GU: Normal external genitalia, there is some erythema at the inferior portion of the urethral meatus, there appears to be some atrophic vaginal changes, otherwise no other  lesions, adnexal area without mass or tenderness, small external hemorrhoid of the anus, otherwise anus normal, exam chaperoned by nurse     Assessment: Encounter Diagnoses  Name Primary?   Dysuria Yes   Urinary frequency    Vaginal dryness    Urethral irritation       Plan: We discussed symptoms and concerns.  I reviewed back from her last visit recently that showed urinalysis without obvious infection and urine culture was not significant.  Wet prep today negative  Urinalysis reviewed today normal  I recommend a trial of hormone vaginal cream 2 to 3 days/week.  Discussed risk and benefits and proper use of medication  We discussed lubricants to help with intercourse.    I recommended a trial off the Vesicare to see if she still needs to be on this or not.  She thinks she actually did better with oxybutynin in the past we discussed the potential risks and benefits of these types of medications.  Continue good water intake throughout the day  Avoid frequent changes in soap and hygiene products.  limit caffeine  Referral to urology for possible urethral polyp or abnormality.  Evamaria was seen today for pelvic pain.  Diagnoses and all orders for this visit:  Dysuria -     POCT Urinalysis DIP (Proadvantage Device) -     POCT Wet Prep [facility]) -     Ambulatory referral to Urology  Urinary frequency -     POCT Urinalysis DIP (Proadvantage Device) -     POCT Wet Prep [facility])  Vaginal dryness -     POCT Urinalysis DIP (Proadvantage Device) -     POCT Wet Prep [facility])  Urethral irritation -     Ambulatory referral to Urology  Other orders -     estradiol (ESTRACE VAGINAL) 0.1 MG/GM vaginal cream; Place 1 Applicatorful vaginally 3 (three) times a week.    F/u pending call back

## 2023-06-28 ENCOUNTER — Encounter: Payer: Self-pay | Admitting: Internal Medicine

## 2023-07-05 ENCOUNTER — Telehealth: Payer: Self-pay | Admitting: *Deleted

## 2023-07-05 NOTE — Telephone Encounter (Signed)
 Copied from CRM [id number]. Topic: Clinical - Medication Question >> Jul 05, 2023 11:15 AM Desiree Thomas wrote: Patient was given a cream that her pcp said to rub around vaginal area but the pharmacy is telling her to insert with a applicator 3 times a week. She wants to know what she needs to do   I am assuming this I to be used vaginally but wanted to make sure, please advise.

## 2023-07-05 NOTE — Telephone Encounter (Signed)
 Patient advised.

## 2023-07-10 ENCOUNTER — Other Ambulatory Visit: Payer: Self-pay | Admitting: Medical

## 2023-07-15 DIAGNOSIS — M545 Low back pain, unspecified: Secondary | ICD-10-CM | POA: Diagnosis not present

## 2023-07-20 ENCOUNTER — Ambulatory Visit: Admitting: Urology

## 2023-07-27 ENCOUNTER — Telehealth: Payer: Self-pay | Admitting: Medical

## 2023-07-27 MED ORDER — FLUOXETINE HCL 20 MG PO CAPS: ORAL_CAPSULE | ORAL | 1 refills | Status: DC

## 2023-07-27 MED ORDER — AMLODIPINE BESYLATE 10 MG PO TABS: ORAL_TABLET | ORAL | 1 refills | Status: DC

## 2023-07-27 MED ORDER — METHOCARBAMOL 750 MG PO TABS: ORAL_TABLET | ORAL | 1 refills | Status: DC

## 2023-07-27 NOTE — Telephone Encounter (Signed)
 Refilled medication.

## 2023-07-27 NOTE — Telephone Encounter (Signed)
 Fax from [facility] pharmacy  amlodipine    Fluoxetine   Methocarbamol 

## 2023-07-28 ENCOUNTER — Telehealth: Payer: Self-pay

## 2023-07-28 ENCOUNTER — Other Ambulatory Visit: Payer: Self-pay | Admitting: Medical

## 2023-07-28 MED ORDER — QUETIAPINE FUMARATE 25 MG PO TABS: 25.0000 mg | ORAL_TABLET | Freq: Every day | ORAL | 1 refills | Status: DC

## 2023-07-28 MED ORDER — FLUCONAZOLE 150 MG PO TABS: 150.0000 mg | ORAL_TABLET | ORAL | 0 refills | Status: DC

## 2023-07-28 MED ORDER — ATORVASTATIN CALCIUM 40 MG PO TABS: ORAL_TABLET | ORAL | 1 refills | Status: DC

## 2023-07-28 MED ORDER — PREGABALIN 150 MG PO CAPS: ORAL_CAPSULE | ORAL | 5 refills | Status: DC

## 2023-07-28 MED ORDER — PROPRANOLOL HCL ER 60 MG PO CP24: 60.0000 mg | ORAL_CAPSULE | Freq: Every day | ORAL | 2 refills | Status: DC

## 2023-07-28 NOTE — Telephone Encounter (Signed)
 refilled

## 2023-07-30 ENCOUNTER — Other Ambulatory Visit: Payer: Self-pay | Admitting: Orthopedic Surgery

## 2023-08-01 NOTE — Telephone Encounter (Signed)
 Pregabalin  filled on 07/28/2023

## 2023-08-03 ENCOUNTER — Telehealth: Payer: Self-pay | Admitting: *Deleted

## 2023-08-03 NOTE — Telephone Encounter (Signed)
 Copied from CRM [id number]. Topic: General - Call Back - No Documentation >> Aug 03, 2023  1:55 PM [name] wrote: Reason for CRM: Patient states has two missed calls from the office but no message was left. Nothing documented as to what the calls were about.   Patient is requesting a call back at [phone].   Called patient and let her know that her refills were sent.

## 2023-08-05 NOTE — Progress Notes (Signed)
 Surgical Instructions   Your procedure is scheduled on Wednesday August 17, 2023. Report to [facility] Main Entrance "A" at 10:00 A.M., then check in with the Admitting office. Any questions or running late day of surgery: call [phone]  Questions prior to your surgery date: call [phone], Monday-Friday, 8am-4pm. If you experience any cold or flu symptoms such as cough, fever, chills, shortness of breath, etc. between now and your scheduled surgery, please notify us  at the above number.     Remember:  Do not eat after midnight the night before your surgery  You may drink clear liquids until 10:00 the morning of your surgery.   Clear liquids allowed are: Water, Non-Citrus Juices (without pulp), Carbonated Beverages, Clear Tea (no milk, honey, etc.), Black Coffee Only (NO MILK, CREAM OR POWDERED CREAMER of any kind), and Gatorade.  Patient Instructions  The night before surgery:  No food after midnight. ONLY clear liquids after midnight  The day of surgery (if you do NOT have diabetes):  Drink ONE (1) Pre-Surgery Clear Ensure by 10:00 the morning of surgery. Drink in one sitting. Do not sip.  This drink was given to you during your hospital  pre-op appointment visit.  Nothing else to drink after completing the  Pre-Surgery Clear Ensure.         If you have questions, please contact your surgeon's office.  Take these medicines the morning of surgery with A SIP OF WATER  amLODipine  (NORVASC   atorvastatin  (LIPITOR)  FLUoxetine  (PROZAC )  loratadine (CLARITIN)  pregabalin  (LYRICA )  propranolol  ER (INDERAL  LA)   May take these medicines IF NEEDED: methocarbamol  (ROBAXIN )  promethazine  (PHENERGAN )  traMADol  (ULTRAM )   Follow your surgeon's instructions on when to stop Asprin.  If no instructions were given by your surgeon then you will need to call the office to get those instructions.    One week prior to surgery, STOP taking any Aleve , Naproxen , Ibuprofen, Motrin, Advil,  Goody's, BC's, all herbal medications, fish oil, and non-prescription vitamins.                     Do NOT Smoke (Tobacco/Vaping) for 24 hours prior to your procedure.  If you use a CPAP at night, you may bring your mask/headgear for your overnight stay.   You will be asked to remove any contacts, glasses, piercing's, hearing aid's, dentures/partials prior to surgery. Please bring cases for these items if needed.    Patients discharged the day of surgery will not be allowed to drive home, and someone needs to stay with them for 24 hours.  SURGICAL WAITING ROOM VISITATION Patients may have no more than 2 support people in the waiting area - these visitors may rotate.   Pre-op nurse will coordinate an appropriate time for 1 ADULT support person, who may not rotate, to accompany patient in pre-op.  Children under the age of 88 must have an adult with them who is not the patient and must remain in the main waiting area with an adult.  If the patient needs to stay at the hospital during part of their recovery, the visitor guidelines for inpatient rooms apply.  Please refer to the [facility] website for the visitor guidelines for any additional information.   If you received a COVID test during your pre-op visit  it is requested that you wear a mask when out in public, stay away from anyone that may not be feeling well and notify your surgeon if you develop symptoms. If  you have been in contact with anyone that has tested positive in the last 10 days please notify you surgeon.      Pre-operative 5 CHG Bathing Instructions   You can play a key role in reducing the risk of infection after surgery. Your skin needs to be as free of germs as possible. You can reduce the number of germs on your skin by washing with CHG (chlorhexidine  gluconate) soap before surgery. CHG is an antiseptic soap that kills germs and continues to kill germs even after washing.   DO NOT use if you have an allergy to  chlorhexidine /CHG or antibacterial soaps. If your skin becomes reddened or irritated, stop using the CHG and notify one of our RNs at [phone].   Please shower with the CHG soap starting 4 days before surgery using the following schedule:     Please keep in mind the following:  DO NOT shave, including legs and underarms, starting the day of your first shower.   You may shave your face at any point before/day of surgery.  Place clean sheets on your bed the day you start using CHG soap. Use a clean washcloth (not used since being washed) for each shower. DO NOT sleep with pets once you start using the CHG.   CHG Shower Instructions:  Wash your face and private area with normal soap. If you choose to wash your hair, wash first with your normal shampoo.  After you use shampoo/soap, rinse your hair and body thoroughly to remove shampoo/soap residue.  Turn the water OFF and apply about 3 tablespoons (45 ml) of CHG soap to a CLEAN washcloth.  Apply CHG soap ONLY FROM YOUR NECK DOWN TO YOUR TOES (washing for 3-5 minutes)  DO NOT use CHG soap on face, private areas, open wounds, or sores.  Pay special attention to the area where your surgery is being performed.  If you are having back surgery, having someone wash your back for you may be helpful. Wait 2 minutes after CHG soap is applied, then you may rinse off the CHG soap.  Pat dry with a clean towel  Put on clean clothes/pajamas   If you choose to wear lotion, please use ONLY the CHG-compatible lotions that are listed below.  Additional instructions for the day of surgery: DO NOT APPLY any lotions, deodorants, cologne, or perfumes.   Do not bring valuables to the hospital. [facility] is not responsible for any belongings/valuables. Do not wear nail polish, gel polish, artificial nails, or any other type of covering on natural nails (fingers and toes) Do not wear jewelry or makeup Put on clean/comfortable clothes.  Please brush your  teeth.  Ask your nurse before applying any prescription medications to the skin.     CHG Compatible Lotions   Aveeno Moisturizing lotion  Cetaphil Moisturizing Cream  Cetaphil Moisturizing Lotion  Clairol Herbal Essence Moisturizing Lotion, Dry Skin  Clairol Herbal Essence Moisturizing Lotion, Extra Dry Skin  Clairol Herbal Essence Moisturizing Lotion, Normal Skin  Curel Age Defying Therapeutic Moisturizing Lotion with Alpha Hydroxy  Curel Extreme Care Body Lotion  Curel Soothing Hands Moisturizing Hand Lotion  Curel Therapeutic Moisturizing Cream, Fragrance-Free  Curel Therapeutic Moisturizing Lotion, Fragrance-Free  Curel Therapeutic Moisturizing Lotion, Original Formula  Eucerin Daily Replenishing Lotion  Eucerin Dry Skin Therapy Plus Alpha Hydroxy Crme  Eucerin Dry Skin Therapy Plus Alpha Hydroxy Lotion  Eucerin Original Crme  Eucerin Original Lotion  Eucerin Plus Crme Eucerin Plus Lotion  Eucerin TriLipid Replenishing Lotion  Keri Anti-Bacterial Hand Lotion  Keri Deep Conditioning Original Lotion Dry Skin Formula Softly Scented  Keri Deep Conditioning Original Lotion, Fragrance Free Sensitive Skin Formula  Keri Lotion Fast Absorbing Fragrance Free Sensitive Skin Formula  Keri Lotion Fast Absorbing Softly Scented Dry Skin Formula  Keri Original Lotion  Keri Skin Renewal Lotion Keri Silky Smooth Lotion  Keri Silky Smooth Sensitive Skin Lotion  Nivea Body Creamy Conditioning Oil  Nivea Body Extra Enriched Lotion  Nivea Body Original Lotion  Nivea Body Sheer Moisturizing Lotion Nivea Crme  Nivea Skin Firming Lotion  NutraDerm 30 Skin Lotion  NutraDerm Skin Lotion  NutraDerm Therapeutic Skin Cream  NutraDerm Therapeutic Skin Lotion  ProShield Protective Hand Cream  Provon moisturizing lotion  Please read over the following fact sheets that you were given.

## 2023-08-09 ENCOUNTER — Other Ambulatory Visit: Payer: Self-pay

## 2023-08-09 ENCOUNTER — Encounter (HOSPITAL_COMMUNITY): Payer: Self-pay

## 2023-08-09 ENCOUNTER — Encounter (HOSPITAL_COMMUNITY)
Admission: RE | Admit: 2023-08-09 | Discharge: 2023-08-09 | Disposition: A | Discharge status: Home / Self Care | Source: Ambulatory Visit | Attending: Orthopedic Surgery | Admitting: Orthopedic Surgery

## 2023-08-09 VITALS — BP 110/65 | HR 70 | Temp 98.9°F | Resp 17 | Ht 62.5 in | Wt 162.6 lb

## 2023-08-09 DIAGNOSIS — I2510 Atherosclerotic heart disease of native coronary artery without angina pectoris: Secondary | ICD-10-CM | POA: Insufficient documentation

## 2023-08-09 DIAGNOSIS — Z01818 Encounter for other preprocedural examination: Secondary | ICD-10-CM | POA: Insufficient documentation

## 2023-08-09 HISTORY — DX: Fibromyalgia: M79.7

## 2023-08-09 LAB — BASIC METABOLIC PANEL WITH GFR
Anion gap: 8 (ref 5–15)
BUN: 15 mg/dL (ref 8–23)
CO2: 28 mmol/L (ref 22–32)
Calcium: 9.1 mg/dL (ref 8.9–10.3)
Chloride: 105 mmol/L (ref 98–111)
Creatinine, Ser: 0.87 mg/dL (ref 0.44–1.00)
GFR, Estimated: >60 mL/min (ref >60)
Glucose, Bld: 76 mg/dL (ref 70–99)
Potassium: 3.9 mmol/L (ref 3.5–5.1)
Sodium: 141 mmol/L (ref 135–145)

## 2023-08-09 LAB — TYPE AND SCREEN
ABO/RH(D): O POS
Antibody Screen: NEGATIVE

## 2023-08-09 LAB — CBC
HCT: 35 % — ABNORMAL LOW (ref 36.0–46.0)
Hemoglobin: 12.2 g/dL (ref 12.0–15.0)
MCH: 27.7 pg (ref 26.0–34.0)
MCHC: 34.9 g/dL (ref 30.0–36.0)
MCV: 79.4 fL — ABNORMAL LOW (ref 80.0–100.0)
Platelets: 371 10*3/uL (ref 150–400)
RBC: 4.41 MIL/uL (ref 3.87–5.11)
RDW: 14.9 % (ref 11.5–15.5)
WBC: 7 10*3/uL (ref 4.0–10.5)
nRBC: 0 % (ref 0.0–0.2)

## 2023-08-09 LAB — SURGICAL PCR SCREEN
MRSA, PCR: NEGATIVE
Staphylococcus aureus: NEGATIVE

## 2023-08-09 NOTE — Progress Notes (Signed)
 PCP - Nelda Balsam, PA-C Cardiologist - denies  PPM/ICD - denies   Chest x-ray - denies EKG - 08/09/23 Stress Test - denies ECHO - denies Cardiac Cath - denies  Sleep Study - denies   DM- denies  Last dose of GLP1 agonist-  n/a   Blood Thinner Instructions: n/a Aspirin Instructions: Hold 7 days. Last dose 5/27.  ERAS Protcol - clears until 1000 PRE-SURGERY Ensure- given  COVID TEST- n/a   Anesthesia review: no  Patient denies shortness of breath, fever, cough and chest pain at PAT appointment   All instructions explained to the patient, with a verbal understanding of the material. Patient agrees to go over the instructions while at home for a better understanding.  The opportunity to ask questions was provided.

## 2023-08-10 NOTE — Progress Notes (Deleted)
 Chief Complaint: No chief complaint on file.   History of Present Illness:  Desiree Thomas is a 63 y.o. female who is seen in consultation from Tysinger, Christiane Cowing, PA-C for evaluation of ***.   Past Medical History:  Past Medical History:  Diagnosis Date   Allergy    Arthritis    Asthma    no inhaler - as a young adult, no current problems   Chronic back pain    Depression    Fibromyalgia    GERD (gastroesophageal reflux disease)    no meds, diet controlled   Hearing loss    45% on right ear   History of meningitis    1992   Hyperlipidemia    Hypertension    pt denies, she said she takes propranolol  for migraines   Migraine     Past Surgical History:  Past Surgical History:  Procedure Laterality Date   ABDOMINAL HYSTERECTOMY     still has ovaries   ANTERIOR LAT LUMBAR FUSION Left 02/18/2022   Procedure: LEFT-SIDED LUMBAR 3 - LUMBAR 4 LATERAL INTERBODY FUSION WITH INSTRUMENTATION AND ALLOGRAFT;  Surgeon: Virl Grimes, MD;  Location: [facility] OR;  Service: Orthopedics;  Laterality: Left;   APPENDECTOMY     CESAREAN SECTION     x2   COLONOSCOPY  2019   cyst removed from ankle     KNEE SURGERY  02/2013   KNEE SURGERY Bilateral    arthroscopy   LUMBAR FUSION  02/2014   LUMBAR SPINE SURGERY     fusion   trigger finger of thumb Bilateral    TRIGGER FINGER RELEASE Right 09/21/2021   Procedure: RIGHT RING AND SMALL TRIGGER FINGER RELEASE and INJECTIONS OF RIGHT INDEX AND LONG FINGER;  Surgeon: Bettyjane Brunet, MD;  Location: [facility] OR;  Service: Orthopedics;  Laterality: Right;    Allergies:  Allergies  Allergen Reactions   Morphine Anaphylaxis   Penicillins Hives and Itching   Percocet [Oxycodone -Acetaminophen ] Itching    OK with benadryl    Topamax [Topiramate] Other (See Comments)    Emotional    Vicodin Hp [Hydrocodone -Acetaminophen ] Itching    Severe     Family History:  Family History  Problem Relation Age of Onset   Arthritis Mother    Heart disease  Mother    Asthma Mother    Diverticulitis Mother    Migraines Mother    Diabetes Father    Diverticulitis Father    Arthritis Sister    Asthma Sister    Migraines Sister    Arthritis Sister    Asthma Sister    Cancer Maternal Grandmother        stomach   Diabetes Paternal Grandmother    Dementia Paternal Grandmother    Migraines Daughter     Social History:  Social History   Tobacco Use   Smoking status: Former    Current packs/day: 0.00    Average packs/day: 1 pack/day for 33.0 years (33.0 ttl pk-yrs)    Types: Cigarettes    Start date: 02/12/1989    Quit date: 02/17/2022    Years since quitting: 1.4   Smokeless tobacco: Never   Tobacco comments:    Since 2012  Vaping Use   Vaping status: Some Days   Substances: Nicotine, Flavoring  Substance Use Topics   Alcohol  use: Not Currently   Drug use: Yes    Frequency: 3.0 times per week    Types: Marijuana    Comment: a blunt per day, states it helps with  her pain    Review of symptoms:  Constitutional:  Negative for unexplained weight loss, night sweats, fever, chills ENT:  Negative for nose bleeds, sinus pain, painful swallowing CV:  Negative for chest pain, shortness of breath, exercise intolerance, palpitations, loss of consciousness Resp:  Negative for cough, wheezing, shortness of breath GI:  Negative for nausea, vomiting, diarrhea, bloody stools GU:  Positives noted in HPI; otherwise negative for gross hematuria, dysuria, urinary incontinence Neuro:  Negative for seizures, poor balance, limb weakness, slurred speech Psych:  Negative for lack of energy, depression, anxiety Endocrine:  Negative for polydipsia, polyuria, symptoms of hypoglycemia (dizziness, hunger, sweating) Hematologic:  Negative for anemia, purpura, petechia, prolonged or excessive bleeding, use of anticoagulants  Allergic:  Negative for difficulty breathing or choking as a result of exposure to anything; no shellfish allergy; no allergic response  (rash/itch) to materials, foods  Physical exam: There were no vitals taken for this visit. GENERAL APPEARANCE:  Well appearing, well developed, well nourished, NAD HEENT: Atraumatic, Normocephalic. NECK: Normal appearance LUNGS: Normal inspiratory and expiratory excursion HEART: Regular Rate ABDOMEN: *** EXTREMITIES: Moves all extremities well.  Without clubbing, cyanosis, or edema. NEUROLOGIC:  Alert and oriented x 3, normal gait, CN II-XII grossly intact.  MENTAL STATUS:  Appropriate. SKIN:  Warm, dry and intact.    Results: Results for orders placed or performed during the hospital encounter of 08/09/23 (from the past 24 hours)  Surgical pcr screen   Collection Time: 08/09/23  2:06 PM   Specimen: Nasal Mucosa; Nasal Swab  Result Value Ref Range   MRSA, PCR NEGATIVE NEGATIVE   Staphylococcus aureus NEGATIVE NEGATIVE  Type and screen [facility]   Collection Time: 08/09/23  2:25 PM  Result Value Ref Range   ABO/RH(D) O POS    Antibody Screen NEG    Sample Expiration 08/23/2023,2359    Extend sample reason      NO TRANSFUSIONS OR PREGNANCY IN THE PAST 3 MONTHS Performed at [facility], [address], [place], [place] [address]   Basic metabolic panel per protocol   Collection Time: 08/09/23  2:30 PM  Result Value Ref Range   Sodium 141 135 - 145 mmol/L   Potassium 3.9 3.5 - 5.1 mmol/L   Chloride 105 98 - 111 mmol/L   CO2 28 22 - 32 mmol/L   Glucose, Bld 76 70 - 99 mg/dL   BUN 15 8 - 23 mg/dL   Creatinine, Ser 6.04 0.44 - 1.00 mg/dL   Calcium  9.1 8.9 - 10.3 mg/dL   GFR, Estimated >54 >09 mL/min   Anion gap 8 5 - 15  CBC per protocol   Collection Time: 08/09/23  2:30 PM  Result Value Ref Range   WBC 7.0 4.0 - 10.5 K/uL   RBC 4.41 3.87 - 5.11 MIL/uL   Hemoglobin 12.2 12.0 - 15.0 g/dL   HCT 81.1 (L) 91.4 - 78.2 %   MCV 79.4 (L) 80.0 - 100.0 fL   MCH 27.7 26.0 - 34.0 pg   MCHC 34.9 30.0 - 36.0 g/dL   RDW 95.6 21.3 - 08.6 %   Platelets 371  150 - 400 K/uL   nRBC 0.0 0.0 - 0.2 %    I have reviewed prior patient's records  I have reviewed referring/prior physicians records  I have reviewed urinalysis  I have reviewed prior urine cultures  I reviewed prior imaging studies  Assessment: No diagnosis found.   Plan: ***

## 2023-08-12 ENCOUNTER — Other Ambulatory Visit: Payer: Self-pay | Admitting: Medical

## 2023-08-15 ENCOUNTER — Ambulatory Visit: Admitting: Urology

## 2023-08-17 ENCOUNTER — Inpatient Hospital Stay (HOSPITAL_COMMUNITY): Admission: RE | Admit: 2023-08-17 | Source: Ambulatory Visit | Admitting: Orthopedic Surgery

## 2023-08-17 ENCOUNTER — Encounter (HOSPITAL_COMMUNITY): Admission: RE | Payer: Self-pay | Source: Home / Self Care

## 2023-08-17 SURGERY — POSTERIOR LUMBAR FUSION 1 LEVEL
Anesthesia: General

## 2023-08-22 ENCOUNTER — Other Ambulatory Visit: Payer: Self-pay | Admitting: Medical

## 2023-08-24 NOTE — Progress Notes (Signed)
 Chief Complaint: No chief complaint on file.   History of Present Illness:  Desiree Thomas is a 63 y.o. female who is seen in consultation from Tysinger, Christiane Cowing, PA-C for evaluation of history of recurrent urinary tract infections as well as a urethral abnormality.  She additionally has urinary frequency and urgency and bothersome nocturia.  She had been on oxybutynin  twice a day for well over 20 years, was switched to solifenacin  within the past 2 to 3 years.  That quieted her urinary symptoms down significantly.  More recently, she has been off of this with her worsening LUTS.   Past Medical History:  Past Medical History:  Diagnosis Date   Allergy    Arthritis    Asthma    no inhaler - as a young adult, no current problems   Chronic back pain    Depression    Fibromyalgia    GERD (gastroesophageal reflux disease)    no meds, diet controlled   Hearing loss    45% on right ear   History of meningitis    1992   Hyperlipidemia    Hypertension    pt denies, she said she takes propranolol  for migraines   Migraine     Past Surgical History:  Past Surgical History:  Procedure Laterality Date   ABDOMINAL HYSTERECTOMY     still has ovaries   ANTERIOR LAT LUMBAR FUSION Left 02/18/2022   Procedure: LEFT-SIDED LUMBAR 3 - LUMBAR 4 LATERAL INTERBODY FUSION WITH INSTRUMENTATION AND ALLOGRAFT;  Surgeon: Virl Grimes, MD;  Location: [facility] OR;  Service: Orthopedics;  Laterality: Left;   APPENDECTOMY     CESAREAN SECTION     x2   COLONOSCOPY  2019   cyst removed from ankle     KNEE SURGERY  02/2013   KNEE SURGERY Bilateral    arthroscopy   LUMBAR FUSION  02/2014   LUMBAR SPINE SURGERY     fusion   trigger finger of thumb Bilateral    TRIGGER FINGER RELEASE Right 09/21/2021   Procedure: RIGHT RING AND SMALL TRIGGER FINGER RELEASE and INJECTIONS OF RIGHT INDEX AND LONG FINGER;  Surgeon: Bettyjane Brunet, MD;  Location: [facility] OR;  Service: Orthopedics;  Laterality: Right;     Allergies:  Allergies  Allergen Reactions   Morphine Anaphylaxis   Penicillins Hives and Itching   Percocet [Oxycodone -Acetaminophen ] Itching    OK with benadryl    Topamax [Topiramate] Other (See Comments)    Emotional    Vicodin Hp [Hydrocodone -Acetaminophen ] Itching    Severe     Family History:  Family History  Problem Relation Age of Onset   Arthritis Mother    Heart disease Mother    Asthma Mother    Diverticulitis Mother    Migraines Mother    Diabetes Father    Diverticulitis Father    Arthritis Sister    Asthma Sister    Migraines Sister    Arthritis Sister    Asthma Sister    Cancer Maternal Grandmother        stomach   Diabetes Paternal Grandmother    Dementia Paternal Grandmother    Migraines Daughter     Social History:  Social History   Tobacco Use   Smoking status: Former    Current packs/day: 0.00    Average packs/day: 1 pack/day for 33.0 years (33.0 ttl pk-yrs)    Types: Cigarettes    Start date: 02/12/1989    Quit date: 02/17/2022    Years since quitting: 1.5  Smokeless tobacco: Never   Tobacco comments:    Since 2012  Vaping Use   Vaping status: Some Days   Substances: Nicotine, Flavoring  Substance Use Topics   Alcohol  use: Not Currently   Drug use: Yes    Frequency: 3.0 times per week    Types: Marijuana    Comment: a blunt per day, states it helps with her pain    Review of symptoms:  Constitutional:  Negative for unexplained weight loss, night sweats, fever, chills ENT:  Negative for nose bleeds, sinus pain, painful swallowing CV:  Negative for chest pain, shortness of breath, exercise intolerance, palpitations, loss of consciousness Resp:  Negative for cough, wheezing, shortness of breath GI:  Negative for nausea, vomiting, diarrhea, bloody stools GU:  Positives noted in HPI; otherwise negative for gross hematuria, dysuria, urinary incontinence Neuro:  Negative for seizures, poor balance, limb weakness, slurred  speech Psych:  Negative for lack of energy, depression, anxiety Endocrine:  Negative for polydipsia, polyuria, symptoms of hypoglycemia (dizziness, hunger, sweating) Hematologic:  Negative for anemia, purpura, petechia, prolonged or excessive bleeding, use of anticoagulants  Allergic:  Negative for difficulty breathing or choking as a result of exposure to anything; no shellfish allergy; no allergic response (rash/itch) to materials, foods  Physical exam: There were no vitals taken for this visit. GENERAL APPEARANCE:  Well appearing, well developed, well nourished, NAD HEENT: Atraumatic, Normocephalic. NECK: Normal appearance LUNGS: Normal inspiratory and expiratory excursion HEART: Regular Rate ABDOMEN: Mildly obese.  No mass or tenderness. GU: Small urethral caruncle.  Minimal vaginal atrophic changes. EXTREMITIES: Moves all extremities well.  Without clubbing, cyanosis, or edema. NEUROLOGIC:  Alert and oriented x 3, normal gait, CN II-XII grossly intact.  MENTAL STATUS:  Appropriate. SKIN:  Warm, dry and intact.    Results:   I have reviewed prior patient's records  I have reviewed referring/prior physicians records  I have reviewed urinalyses-normal today, no abnormalities of her past 5 tests, dating back to June 2023.  I have reviewed prior urine cultures--cultures from November 2023, June 2024, March 2025 negative.  I reviewed prior imaging studies  Assessment: -Urethral caruncle.  Minimally symptomatic  -Urinary frequency, nocturia, urgency.  Would like to get back on medical therapy  -History of dysuria without documented urinary tract sections  Plan: -I agree with her PCP, I would to have her start/continue estrogen cream in her perivaginal area 2 nights a week  -I sent in a prescription for oxybutynin  IR 5 mg twice a day  -Reassurance regarding her exam  -I will have her come back on an as-needed basis

## 2023-08-29 ENCOUNTER — Ambulatory Visit (INDEPENDENT_AMBULATORY_CARE_PROVIDER_SITE_OTHER): Payer: Self-pay | Admitting: Urology

## 2023-08-29 VITALS — BP 149/92 | HR 74 | Ht 62.0 in | Wt 160.0 lb

## 2023-08-29 DIAGNOSIS — Z87898 Personal history of other specified conditions: Secondary | ICD-10-CM | POA: Diagnosis not present

## 2023-08-29 DIAGNOSIS — R300 Dysuria: Secondary | ICD-10-CM | POA: Diagnosis not present

## 2023-08-29 DIAGNOSIS — R3915 Urgency of urination: Secondary | ICD-10-CM | POA: Diagnosis not present

## 2023-08-29 DIAGNOSIS — R351 Nocturia: Secondary | ICD-10-CM | POA: Diagnosis not present

## 2023-08-29 DIAGNOSIS — R350 Frequency of micturition: Secondary | ICD-10-CM | POA: Diagnosis not present

## 2023-08-29 DIAGNOSIS — N362 Urethral caruncle: Secondary | ICD-10-CM | POA: Diagnosis not present

## 2023-08-29 DIAGNOSIS — N952 Postmenopausal atrophic vaginitis: Secondary | ICD-10-CM

## 2023-08-29 LAB — MICROSCOPIC EXAMINATION

## 2023-08-29 LAB — URINALYSIS, ROUTINE W REFLEX MICROSCOPIC
Bilirubin, UA: NEGATIVE
Glucose, UA: NEGATIVE
Ketones, UA: NEGATIVE
Leukocytes,UA: NEGATIVE
Nitrite, UA: NEGATIVE
RBC, UA: NEGATIVE
Specific Gravity, UA: 1.025 (ref 1.005–1.030)
Urobilinogen, Ur: 0.2 mg/dL (ref 0.2–1.0)
pH, UA: 6 (ref 5.0–7.5)

## 2023-08-29 LAB — BLADDER SCAN AMB NON-IMAGING: Scan Result: 50

## 2023-08-29 MED ORDER — OXYBUTYNIN CHLORIDE 5 MG PO TABS
5.0000 mg | ORAL_TABLET | Freq: Two times a day (BID) | ORAL | 3 refills | Status: AC
Start: 2023-08-29 — End: ?

## 2023-09-29 DIAGNOSIS — Z01812 Encounter for preprocedural laboratory examination: Secondary | ICD-10-CM | POA: Diagnosis not present

## 2023-10-02 ENCOUNTER — Other Ambulatory Visit: Payer: Self-pay | Admitting: Medical

## 2023-10-17 ENCOUNTER — Other Ambulatory Visit: Payer: Self-pay | Admitting: Medical

## 2023-10-18 ENCOUNTER — Other Ambulatory Visit: Payer: Self-pay | Admitting: Orthopedic Surgery

## 2023-11-01 ENCOUNTER — Ambulatory Visit

## 2023-11-01 DIAGNOSIS — Z0000 Encounter for general adult medical examination without abnormal findings: Secondary | ICD-10-CM | POA: Diagnosis not present

## 2023-11-01 NOTE — Patient Instructions (Signed)
 Ms. Desiree Thomas , Thank you for taking time out of your busy schedule to complete your Annual Wellness Visit with me. I enjoyed our conversation and look forward to speaking with you again next year. I, as well as your care team,  appreciate your ongoing commitment to your health goals. Please review the following plan we discussed and let me know if I can assist you in the future. Your Game plan/ To Do List    Referrals: If you haven't heard from the office you've been referred to, please reach out to them at the phone provided.   Follow up Visits: We will see or speak with you next year for your Next Medicare AWV with our clinical staff Have you seen your provider in the last 6 months (3 months if uncontrolled diabetes)? Yes  Clinician Recommendations:  Aim for 30 minutes of exercise or brisk walking, 6-8 glasses of water, and 5 servings of fruits and vegetables each day.       This is a list of the screenings recommended for you:  Health Maintenance  Topic Date Due   Pneumococcal Vaccine for age over 31 (2 of 2 - PCV) 12/24/2021   Flu Shot  10/14/2023   Screening for Lung Cancer  02/18/2024   Medicare Annual Wellness Visit  10/31/2024   Mammogram  06/22/2025   Colon Cancer Screening  12/29/2027   DTaP/Tdap/Td vaccine (2 - Tdap) 09/05/2030   Hepatitis C Screening  Completed   HIV Screening  Completed   Hepatitis B Vaccine  Aged Out   HPV Vaccine  Aged Out   Meningitis B Vaccine  Aged Out   Pneumococcal Vaccine  Discontinued   COVID-19 Vaccine  Discontinued   Zoster (Shingles) Vaccine  Discontinued    Advanced directives: (Copy Requested) Please bring a copy of your health care power of attorney and living will to the office to be added to your chart at your convenience. You can mail to [facility] [address] [place], [place] [address] or email to [email or web address] Advance Care Planning is important because it:  [x]  Makes sure you receive the medical  care that is consistent with your values, goals, and preferences  [x]  It provides guidance to your family and loved ones and reduces their decisional burden about whether or not they are making the right decisions based on your wishes.  Follow the link provided in your after visit summary or read over the paperwork we have mailed to you to help you started getting your Advance Directives in place. If you need assistance in completing these, please reach out to us  so that we can help you!  See attachments for Preventive Care and Fall Prevention Tips.

## 2023-11-01 NOTE — Progress Notes (Signed)
 Subjective:   Desiree Thomas is a 63 y.o. who presents for a Medicare Wellness preventive visit.  As a reminder, Annual Wellness Visits don't include a physical exam, and some assessments may be limited, especially if this visit is performed virtually. We may recommend an in-person follow-up visit with your provider if needed.  Visit Complete: Virtual I connected with  Arla Boutwell on 11/01/23 by a video and audio enabled telemedicine application and verified that I am speaking with the correct person using two identifiers.  Patient Location: Home  Provider Location: Office/Clinic  I discussed the limitations of evaluation and management by telemedicine. The patient expressed understanding and agreed to proceed.  Vital Signs: Because this visit was a virtual/telehealth visit, some criteria may be missing or patient reported. Any vitals not documented were not able to be obtained and vitals that have been documented are patient reported.    Persons Participating in Visit: Patient.  AWV Questionnaire: Yes: Patient Medicare AWV questionnaire was completed by the patient on 10/28/2023; I have confirmed that all information answered by patient is correct and no changes since this date.  Cardiac Risk Factors include: dyslipidemia;hypertension     Objective:    Today's Vitals   11/01/23 1506  PainSc: 8    There is no height or weight on file to calculate BMI.     11/01/2023    3:22 PM 08/09/2023    1:58 PM 05/04/2022   12:03 PM 02/18/2022    6:51 AM 02/08/2022   11:22 AM 04/10/2021    8:30 AM  Advanced Directives  Does Patient Have a Medical Advance Directive? Yes Yes No No No No  Type of Estate agent of [place];Living will Healthcare Power of [place];Living will      Does patient want to make changes to medical advance directive?  No - Patient declined      Copy of Healthcare Power of Attorney in Chart? No - copy requested No - copy requested      Would  patient like information on creating a medical advance directive?    No - Patient declined Yes (MAU/Ambulatory/Procedural Areas - Information given) Yes (MAU/Ambulatory/Procedural Areas - Information given)    Current Medications (verified) Outpatient Encounter Medications as of 11/01/2023  Medication Sig   amLODipine  (NORVASC ) 10 MG tablet TAKE 1 TABLET(10 MG) BY MOUTH DAILY   aspirin EC 81 MG tablet Take 81 mg by mouth daily. Swallow whole.   atorvastatin  (LIPITOR) 40 MG tablet TAKE 1 TABLET(40 MG) BY MOUTH DAILY   estradiol  (ESTRACE  VAGINAL) 0.1 MG/GM vaginal cream Place 1 Applicatorful vaginally 3 (three) times a week. (Patient taking differently: Place 1 Applicatorful vaginally 3 (three) times a week. Once weekly)   Evolocumab  (REPATHA  SURECLICK) 140 MG/ML SOAJ Inject 140 mg into the skin every 14 (fourteen) days.   FLUoxetine  (PROZAC ) 20 MG capsule TAKE 1 CAPSULE(20 MG) BY MOUTH DAILY   loratadine (CLARITIN) 10 MG tablet Take 10 mg by mouth daily.   magnesium  gluconate (MAGONATE) 500 (27 Mg) MG TABS tablet Take 500 mg by mouth at bedtime.   methocarbamol  (ROBAXIN ) 750 MG tablet TAKE 1 TABLET TWICE DAILY AS NEEDED FOR MUSCLE SPASM(S)   Multiple Vitamins-Minerals (MULTIVITAMIN WITH MINERALS) tablet Take 1 tablet by mouth daily.   oxybutynin  (DITROPAN ) 5 MG tablet Take 1 tablet (5 mg total) by mouth 2 (two) times daily.   pregabalin  (LYRICA ) 150 MG capsule TAKE 1 CAPSULE BY MOUTH EVERY MORNING, 1 CAPSULE EVERY AFTERNOON, AND 1 CAPSULE EVERY  EVENING   promethazine  (PHENERGAN ) 12.5 MG tablet Take 12.5 mg by mouth every 6 (six) hours as needed for nausea or vomiting.   propranolol  ER (INDERAL  LA) 60 MG 24 hr capsule TAKE 1 CAPSULE(60 MG) BY MOUTH AT BEDTIME   QUEtiapine  (SEROQUEL ) 25 MG tablet Take 1 tablet (25 mg total) by mouth at bedtime as needed (sleep).   traMADol  (ULTRAM ) 50 MG tablet Take 50 mg by mouth 3 (three) times daily as needed for moderate pain (pain score 4-6).   trolamine  salicylate (ASPERCREME) 10 % cream Apply 1 Application topically as needed for muscle pain.   Turmeric 500 MG CAPS Take 500 mg by mouth 2 (two) times daily.   No facility-administered encounter medications on file as of 11/01/2023.    Allergies (verified) Morphine, Penicillins, Percocet [oxycodone -acetaminophen ], Topamax [topiramate], and Vicodin hp [hydrocodone -acetaminophen ]   History: Past Medical History:  Diagnosis Date   Allergy    Arthritis    Asthma    no inhaler - as a young adult, no current problems   Chronic back pain    Depression    Fibromyalgia    GERD (gastroesophageal reflux disease)    no meds, diet controlled   Hearing loss    45% on right ear   History of meningitis    1992   Hyperlipidemia    Hypertension    pt denies, she said she takes propranolol  for migraines   Migraine    Past Surgical History:  Procedure Laterality Date   ABDOMINAL HYSTERECTOMY     still has ovaries   ANTERIOR LAT LUMBAR FUSION Left 02/18/2022   Procedure: LEFT-SIDED LUMBAR 3 - LUMBAR 4 LATERAL INTERBODY FUSION WITH INSTRUMENTATION AND ALLOGRAFT;  Surgeon: Beuford Anes, MD;  Location: [facility] OR;  Service: Orthopedics;  Laterality: Left;   APPENDECTOMY     CESAREAN SECTION     x2   COLONOSCOPY  2019   cyst removed from ankle     HAND SURGERY Right    2025   KNEE SURGERY  02/2013   KNEE SURGERY Bilateral    arthroscopy   LUMBAR FUSION  02/2014   LUMBAR SPINE SURGERY     fusion   trigger finger of thumb Bilateral    TRIGGER FINGER RELEASE Right 09/21/2021   Procedure: RIGHT RING AND SMALL TRIGGER FINGER RELEASE and INJECTIONS OF RIGHT INDEX AND LONG FINGER;  Surgeon: Doll Skates, MD;  Location: [facility] OR;  Service: Orthopedics;  Laterality: Right;   Family History  Problem Relation Age of Onset   Arthritis Mother    Heart disease Mother    Asthma Mother    Diverticulitis Mother    Migraines Mother    Diabetes Father    Diverticulitis Father    Arthritis Sister     Asthma Sister    Migraines Sister    Arthritis Sister    Asthma Sister    Cancer Maternal Grandmother        stomach   Diabetes Paternal Grandmother    Dementia Paternal Grandmother    Migraines Daughter    Social History   Socioeconomic History   Marital status: Married    Spouse name: Not on file   Number of children: 2   Years of education: Not on file   Highest education level: Master's degree (e.g., MA, MS, MEng, MEd, MSW, MBA)  Occupational History   Not on file  Tobacco Use   Smoking status: Former    Current packs/day: 0.00    Average packs/day:  1 pack/day for 33.0 years (33.0 ttl pk-yrs)    Types: Cigarettes    Start date: 02/12/1989    Quit date: 02/17/2022    Years since quitting: 1.7   Smokeless tobacco: Never   Tobacco comments:    Since 2012  Vaping Use   Vaping status: Former   Substances: Nicotine, Flavoring  Substance and Sexual Activity   Alcohol  use: Not Currently   Drug use: Yes    Frequency: 3.0 times per week    Types: Marijuana    Comment: a blunt per day, states it helps with her pain   Sexual activity: Yes    Birth control/protection: Surgical    Comment: hysterectomy  Other Topics Concern   Not on file  Social History Narrative   Married, Christian.  Disabled.  Has 2 children .  08/2020      Caffeine: 1 cup coffee/day, 1/2 pepsi per day, maybe some tea   Social Drivers of Corporate investment banker Strain: Low Risk  (10/28/2023)   Overall Financial Resource Strain (CARDIA)    Difficulty of Paying Living Expenses: Not very hard  Food Insecurity: No Food Insecurity (10/28/2023)   Hunger Vital Sign    Worried About Running Out of Food in the Last Year: Never true    Ran Out of Food in the Last Year: Never true  Transportation Needs: No Transportation Needs (10/28/2023)   PRAPARE - Administrator, Civil Service (Medical): No    Lack of Transportation (Non-Medical): No  Physical Activity: Insufficiently Active (10/28/2023)    Exercise Vital Sign    Days of Exercise per Week: 3 days    Minutes of Exercise per Session: 30 min  Stress: No Stress Concern Present (10/28/2023)   [employer] of Occupational Health - Occupational Stress Questionnaire    Feeling of Stress: Only a little  Social Connections: Socially Integrated (10/28/2023)   Social Connection and Isolation Panel    Frequency of Communication with Friends and Family: More than three times a week    Frequency of Social Gatherings with Friends and Family: Once a week    Attends Religious Services: More than 4 times per year    Active Member of [employer] or Organizations: Yes    Attends Engineer, structural: More than 4 times per year    Marital Status: Married    Tobacco Counseling Counseling given: Not Answered Tobacco comments: Since 2012    Clinical Intake:  Pre-visit preparation completed: Yes  Pain : 0-10 Pain Score: 8  Pain Type: Chronic pain Pain Location: Back Pain Radiating Towards: down buttocks Pain Descriptors / Indicators: Nagging Pain Onset: More than a month ago Pain Frequency: Constant     Nutritional Risks: None Diabetes: No  No results found for: HGBA1C   How often do you need to have someone help you when you read instructions, pamphlets, or other written materials from your doctor or pharmacy?: 1 - Never  Interpreter Needed?: No  Information entered by :: NAllen LPN   Activities of Daily Living     10/28/2023    9:24 AM 08/09/2023    2:02 PM  In your present state of health, do you have any difficulty performing the following activities:  Hearing? 1   Comment does not have hearing aids   Vision? 1   Comment depends if eyes are tired   Difficulty concentrating or making decisions? 0   Walking or climbing stairs? 0   Dressing or bathing? 0  Doing errands, shopping? 0 0  Preparing Food and eating ? N   Using the Toilet? N   In the past six months, have you accidently leaked urine? N   Do you  have problems with loss of bowel control? N   Managing your Medications? N   Managing your Finances? N   Housekeeping or managing your Housekeeping? N     Patient Care Team: Tysinger, Alm RAMAN, PA-C as PCP - General (Family Medicine) Beuford Anes, MD as Consulting Physician (Orthopedic Surgery)  I have updated your Care Teams any recent Medical Services you may have received from other providers in the past year.     Assessment:   This is a routine wellness examination for [place].  Hearing/Vision screen Hearing Screening - Comments:: Decreased hearing, but no hearing aids Vision Screening - Comments:: Regular eye exams, [facility]   Goals Addressed             This Visit's Progress    Patient Stated       11/01/2023, get through surgery and heal       Depression Screen     11/01/2023    3:27 PM 01/25/2023    3:02 PM 05/04/2022   12:05 PM 12/09/2021    8:41 AM 09/07/2021   11:39 AM 04/10/2021    8:34 AM 02/17/2021    8:33 AM  PHQ 2/9 Scores  PHQ - 2 Score 0 6 0 0 0 0 0  PHQ- 9 Score 4 10 0 0 0  1    Fall Risk     10/28/2023    9:24 AM 01/25/2023    3:01 PM 05/04/2022   12:04 PM 12/09/2021    8:41 AM 04/10/2021    8:32 AM  Fall Risk   Falls in the past year? 1 1 1 1 1   Comment not sure  legs gave out  tripped going upstairs  Number falls in past yr: 1 0 1 0 1  Comment  12/31/22 fell, tripped on rug in house     Injury with Fall? 1 0 0 1 0  Comment  bruised left arm     Risk for fall due to : Medication side effect History of fall(s) History of fall(s);Medication side effect Impaired balance/gait Impaired balance/gait;Medication side effect  Follow up Falls prevention discussed;Falls evaluation completed Falls evaluation completed Falls prevention discussed;Education provided;Falls evaluation completed Falls evaluation completed  Falls evaluation completed;Education provided;Falls prevention discussed      Data saved with a previous flowsheet row definition     MEDICARE RISK AT HOME:  Medicare Risk at Home Any stairs in or around the home?: (Patient-Rptd) Yes If so, are there any without handrails?: (Patient-Rptd) No Home free of loose throw rugs in walkways, pet beds, electrical cords, etc?: (Patient-Rptd) Yes Adequate lighting in your home to reduce risk of falls?: (Patient-Rptd) Yes Life alert?: (Patient-Rptd) Yes Use of a cane, walker or w/c?: (Patient-Rptd) Yes Grab bars in the bathroom?: (Patient-Rptd) No Shower chair or bench in shower?: (Patient-Rptd) Yes Elevated toilet seat or a handicapped toilet?: (Patient-Rptd) Yes  TIMED UP AND GO:  Was the test performed?  No  Cognitive Function: 6CIT completed        11/01/2023    3:31 PM 05/04/2022   12:07 PM 04/10/2021    8:37 AM  6CIT Screen  What Year? 0 points 0 points 0 points  What month? 0 points 0 points 0 points  What time? 0 points 0 points 0 points  Count back from 20 0 points 0 points 0 points  Months in reverse 0 points 0 points 0 points  Repeat phrase 0 points 2 points 6 points  Total Score 0 points 2 points 6 points    Immunizations Immunization History  Administered Date(s) Administered   Influenza Split 12/24/2020   Influenza,inj,Quad PF,6+ Mos 12/09/2021   Influenza-Unspecified 11/27/2022   PFIZER(Purple Top)SARS-COV-2 Vaccination 06/12/2019, 07/03/2019, 07/08/2020   Pfizer(Comirnaty)Fall Seasonal Vaccine 12 years and older 01/08/2022, 11/27/2022   Pneumococcal Polysaccharide-23 12/24/2020   Respiratory Syncytial Virus Vaccine,Recomb Aduvanted(Arexvy) 01/28/2022   Td 09/04/2020    Screening Tests Health Maintenance  Topic Date Due   Pneumococcal Vaccine: 50+ Years (2 of 2 - PCV) 12/24/2021   INFLUENZA VACCINE  10/14/2023   Lung Cancer Screening  02/18/2024   Medicare Annual Wellness (AWV)  10/31/2024   MAMMOGRAM  06/22/2025   Colonoscopy  12/29/2027   DTaP/Tdap/Td (2 - Tdap) 09/05/2030   Hepatitis C Screening  Completed   HIV Screening   Completed   Hepatitis B Vaccines 19-59 Average Risk  Aged Out   HPV VACCINES  Aged Out   Meningococcal B Vaccine  Aged Out   Pneumococcal Vaccine  Discontinued   COVID-19 Vaccine  Discontinued   Zoster Vaccines- Shingrix  Discontinued    Health Maintenance  Health Maintenance Due  Topic Date Due   Pneumococcal Vaccine: 50+ Years (2 of 2 - PCV) 12/24/2021   INFLUENZA VACCINE  10/14/2023   Health Maintenance Items Addressed: Due for pneumonia vaccine.  Additional Screening:  Vision Screening: Recommended annual ophthalmology exams for early detection of glaucoma and other disorders of the eye. Would you like a referral to an eye doctor? No    Dental Screening: Recommended annual dental exams for proper oral hygiene  Community Resource Referral / Chronic Care Management: CRR required this visit?  No   CCM required this visit?  No   Plan:    I have personally reviewed and noted the following in the patient's chart:   Medical and social history Use of alcohol , tobacco or illicit drugs  Current medications and supplements including opioid prescriptions. Patient is not currently taking opioid prescriptions. Functional ability and status Nutritional status Physical activity Advanced directives List of other physicians Hospitalizations, surgeries, and ER visits in previous 12 months Vitals Screenings to include cognitive, depression, and falls Referrals and appointments  In addition, I have reviewed and discussed with patient certain preventive protocols, quality metrics, and best practice recommendations. A written personalized care plan for preventive services as well as general preventive health recommendations were provided to patient.   Ardella FORBES Dawn, LPN   1/80/7974   After Visit Summary: (MyChart) Due to this being a telephonic visit, the after visit summary with patients personalized plan was offered to patient via MyChart   Notes: Nothing significant to  report at this time.

## 2023-11-03 DIAGNOSIS — M25511 Pain in right shoulder: Secondary | ICD-10-CM | POA: Diagnosis not present

## 2023-11-08 NOTE — Pre-Procedure Instructions (Signed)
 Surgical Instructions    Your procedure is scheduled on Wednesday, September 3rd.    Report to [facility] Main Entrance A at 0630 A.M., then check in with the Admitting office.  Call this number if you have problems the morning of surgery:  [phone]  If you have any questions prior to your surgery date call [phone]: Open Monday-Friday 8am-4pm If you experience any cold or flu symptoms such as cough, fever, chills, shortness of breath, etc. between now and your scheduled surgery, please notify us  at the above number.     Remember:  Do not eat after midnight the night before your surgery  You may drink clear liquids until 0530 the morning of your surgery.   Clear liquids allowed are: Water, Non-Citrus Juices (without pulp), Carbonated Beverages, Clear Tea, Black Coffee Only (NO MILK, CREAM OR POWDERED CREAMER of any kind), and Gatorade.  Patient Instructions  The night before surgery:  No food after midnight. ONLY clear liquids after midnight  The day of surgery (if you do NOT have diabetes):  Drink ONE (1) Pre-Surgery Clear Ensure by 0530 the morning of surgery. Drink in one sitting. Do not sip.  This drink was given to you during your hospital  pre-op appointment visit.  Nothing else to drink after completing the  Pre-Surgery Clear Ensure.          If you have questions, please contact your surgeon's office.     Take these medicines the morning of surgery with A SIP OF WATER:        amLODipine  (NORVASC        atorvastatin  (LIPITOR)       FLUoxetine  (PROZAC )       loratadine (CLARITIN)       oxybutynin  (DITROPAN )       pregabalin  (LYRICA )        May take these medicines IF NEEDED:    methocarbamol  (ROBAXIN )     promethazine  (PHENERGAN )     traMADol  (ULTRAM )    Follow your surgeon's instructions on when to stop Asprin.  If no instructions were given by your surgeon then you will need to call the office to get those instructions.     As of today, STOP  taking any Aspirin (unless otherwise instructed by your surgeon) Aleve , Naproxen , Ibuprofen, Motrin, Advil, Goody's, BC's, all herbal medications, fish oil, and all vitamins.                     Do NOT Smoke (Tobacco/Vaping) for 24 hours prior to your procedure.  If you use a CPAP at night, you may bring your mask/headgear for your overnight stay.   Contacts, glasses, piercing's, hearing aid's, dentures or partials may not be worn into surgery, please bring cases for these belongings.    For patients admitted to the hospital, discharge time will be determined by your treatment team.   Patients discharged the day of surgery will not be allowed to drive home, and someone needs to stay with them for 24 hours.  SURGICAL WAITING ROOM VISITATION Patients having surgery or a procedure may have no more than 2 support people in the waiting area - these visitors may rotate.   Children under the age of 62 must have an adult with them who is not the patient. If the patient needs to stay at the hospital during part of their recovery, the visitor guidelines for inpatient rooms apply. Pre-op nurse will coordinate an appropriate time for 1 support person to accompany patient  in pre-op.  This support person may not rotate.   Please refer to the [facility] website for the visitor guidelines for Inpatients (after your surgery is over and you are in a regular room).    Special instructions:   [place]- Preparing For Surgery   Pre-operative 5 CHG Bath Instructions   You can play a key role in reducing the risk of infection after surgery. Your skin needs to be as free of germs as possible. You can reduce the number of germs on your skin by washing with CHG (chlorhexidine  gluconate) soap before surgery. CHG is an antiseptic soap that kills germs and continues to kill germs even after washing.   DO NOT use if you have an allergy to chlorhexidine /CHG or antibacterial soaps. If your skin becomes reddened or  irritated, stop using the CHG and notify one of our RNs at [phone].   Please shower with the CHG soap starting 4 days before surgery using the following schedule:     Please keep in mind the following:  DO NOT shave, including legs and underarms, starting the day of your first shower.   You may shave your face at any point before/day of surgery.  Place clean sheets on your bed the day you start using CHG soap. Use a clean washcloth (not used since being washed) for each shower. DO NOT sleep with pets once you start using the CHG.   CHG Shower Instructions:  If you choose to wash your hair and private area, wash first with your normal shampoo/soap.  After you use shampoo/soap, rinse your hair and body thoroughly to remove shampoo/soap residue.  Turn the water OFF and apply about 3 tablespoons (45 ml) of CHG soap to a CLEAN washcloth.  Apply CHG soap ONLY FROM YOUR NECK DOWN TO YOUR TOES (washing for 3-5 minutes)  DO NOT use CHG soap on face, private areas, open wounds, or sores.  Pay special attention to the area where your surgery is being performed.  If you are having back surgery, having someone wash your back for you may be helpful. Wait 2 minutes after CHG soap is applied, then you may rinse off the CHG soap.  Pat dry with a clean towel  Put on clean clothes/pajamas   If you choose to wear lotion, please use ONLY the CHG-compatible lotions on the back of this paper.     Additional instructions for the day of surgery: DO NOT APPLY any lotions, deodorants, cologne, or perfumes.   Put on clean/comfortable clothes.  Brush your teeth.  Ask your nurse before applying any prescription medications to the skin.      CHG Compatible Lotions   Aveeno Moisturizing lotion  Cetaphil Moisturizing Cream  Cetaphil Moisturizing Lotion  Clairol Herbal Essence Moisturizing Lotion, Dry Skin  Clairol Herbal Essence Moisturizing Lotion, Extra Dry Skin  Clairol Herbal Essence Moisturizing  Lotion, Normal Skin  Curel Age Defying Therapeutic Moisturizing Lotion with Alpha Hydroxy  Curel Extreme Care Body Lotion  Curel Soothing Hands Moisturizing Hand Lotion  Curel Therapeutic Moisturizing Cream, Fragrance-Free  Curel Therapeutic Moisturizing Lotion, Fragrance-Free  Curel Therapeutic Moisturizing Lotion, Original Formula  Eucerin Daily Replenishing Lotion  Eucerin Dry Skin Therapy Plus Alpha Hydroxy Crme  Eucerin Dry Skin Therapy Plus Alpha Hydroxy Lotion  Eucerin Original Crme  Eucerin Original Lotion  Eucerin Plus Crme Eucerin Plus Lotion  Eucerin TriLipid Replenishing Lotion  Keri Anti-Bacterial Hand Lotion  Keri Deep Conditioning Original Lotion Dry Skin Formula Softly Scented  Keri Deep  Conditioning Original Lotion, Fragrance Free Sensitive Skin Formula  Keri Lotion Fast Absorbing Fragrance Free Sensitive Skin Formula  Keri Lotion Fast Absorbing Softly Scented Dry Skin Formula  Keri Original Lotion  Keri Skin Renewal Lotion Keri Silky Smooth Lotion  Keri Silky Smooth Sensitive Skin Lotion  Nivea Body Creamy Conditioning Oil  Nivea Body Extra Enriched Lotion  Nivea Body Original Lotion  Nivea Body Sheer Moisturizing Lotion Nivea Crme  Nivea Skin Firming Lotion  NutraDerm 30 Skin Lotion  NutraDerm Skin Lotion  NutraDerm Therapeutic Skin Cream  NutraDerm Therapeutic Skin Lotion  ProShield Protective Hand Cream  Provon moisturizing lotion   Please read over the following fact sheets that you were given.    If you received a COVID test during your pre-op visit  it is requested that you wear a mask when out in public, stay away from anyone that may not be feeling well and notify your surgeon if you develop symptoms. If you have been in contact with anyone that has tested positive in the last 10 days please notify you surgeon.

## 2023-11-09 ENCOUNTER — Encounter (HOSPITAL_COMMUNITY): Payer: Self-pay

## 2023-11-09 ENCOUNTER — Other Ambulatory Visit: Payer: Self-pay

## 2023-11-09 ENCOUNTER — Encounter (HOSPITAL_COMMUNITY)
Admission: RE | Admit: 2023-11-09 | Discharge: 2023-11-09 | Disposition: A | Discharge status: Home / Self Care | Source: Ambulatory Visit | Attending: Orthopedic Surgery | Admitting: Orthopedic Surgery

## 2023-11-09 VITALS — BP 143/77 | HR 63 | Temp 99.3°F | Resp 17 | Ht 62.0 in | Wt 165.0 lb

## 2023-11-09 DIAGNOSIS — Z01812 Encounter for preprocedural laboratory examination: Secondary | ICD-10-CM | POA: Diagnosis not present

## 2023-11-09 DIAGNOSIS — Z01818 Encounter for other preprocedural examination: Secondary | ICD-10-CM

## 2023-11-09 LAB — CBC
HCT: 35.7 % — ABNORMAL LOW (ref 36.0–46.0)
Hemoglobin: 12.6 g/dL (ref 12.0–15.0)
MCH: 28 pg (ref 26.0–34.0)
MCHC: 35.3 g/dL (ref 30.0–36.0)
MCV: 79.3 fL — ABNORMAL LOW (ref 80.0–100.0)
Platelets: 358 K/uL (ref 150–400)
RBC: 4.5 MIL/uL (ref 3.87–5.11)
RDW: 15.2 % (ref 11.5–15.5)
WBC: 9.3 K/uL (ref 4.0–10.5)
nRBC: 0 % (ref 0.0–0.2)

## 2023-11-09 LAB — SURGICAL PCR SCREEN
MRSA, PCR: NEGATIVE
Staphylococcus aureus: NEGATIVE

## 2023-11-09 LAB — BASIC METABOLIC PANEL WITH GFR
Anion gap: 8 (ref 5–15)
BUN: 10 mg/dL (ref 8–23)
CO2: 29 mmol/L (ref 22–32)
Calcium: 9.2 mg/dL (ref 8.9–10.3)
Chloride: 98 mmol/L (ref 98–111)
Creatinine, Ser: 0.74 mg/dL (ref 0.44–1.00)
GFR, Estimated: >60 mL/min (ref >60)
Glucose, Bld: 87 mg/dL (ref 70–99)
Potassium: 3.9 mmol/L (ref 3.5–5.1)
Sodium: 135 mmol/L (ref 135–145)

## 2023-11-09 LAB — TYPE AND SCREEN
ABO/RH(D): O POS
Antibody Screen: NEGATIVE

## 2023-11-09 NOTE — Progress Notes (Signed)
 PCP - Alm Bulah RIGGERS Cardiologist - denies  PPM/ICD - denies Device Orders -  Rep Notified -   Chest x-ray - na EKG - 08/09/23 Stress Test - denies ECHO - denies Cardiac Cath - denies  Sleep Study - denies CPAP -   Fasting Blood Sugar - na Checks Blood Sugar _____ times a day  Last dose of GLP1 agonist- na  GLP1 instructions:   Blood Thinner Instructions: na Aspirin Instructions:pt states Dr. Beuford instructed her to hold aspirin 7 days prior to surgery. She said her last dose was 11/08/23.  ERAS Protcol - clears until 0530 PRE-SURGERY Ensure or G2- Ensure  COVID TEST- na   Anesthesia review: no  Patient denies shortness of breath, fever, cough and chest pain at PAT appointment   All instructions explained to the patient, with a verbal understanding of the material. Patient agrees to go over the instructions while at home for a better understanding. The opportunity to ask questions was provided.

## 2023-11-15 NOTE — Anesthesia Preprocedure Evaluation (Signed)
 Anesthesia Evaluation  Patient identified by MRN, date of birth, ID band Patient awake    Reviewed: Allergy & Precautions, NPO status , Patient's Chart, lab work & pertinent test results  Airway Mallampati: III  TM Distance: >3 FB Neck ROM: Full    Dental  (+) Dental Advisory Given, Missing   Pulmonary asthma , Patient abstained from smoking., former smoker   Pulmonary exam normal breath sounds clear to auscultation       Cardiovascular hypertension, Pt. on medications + CAD  Normal cardiovascular exam Rhythm:Regular Rate:Normal     Neuro/Psych  Headaches PSYCHIATRIC DISORDERS  Depression       GI/Hepatic ,GERD  ,,(+)     substance abuse  marijuana use  Endo/Other  Obesity   Renal/GU negative Renal ROS     Musculoskeletal  (+) Arthritis ,  Fibromyalgia -  Abdominal   Peds  Hematology negative hematology ROS (+)   Anesthesia Other Findings   Reproductive/Obstetrics                              Anesthesia Physical Anesthesia Plan  ASA: 2  Anesthesia Plan: General   Post-op Pain Management: Tylenol  PO (pre-op)* and Gabapentin  PO (pre-op)*   Induction: Intravenous  PONV Risk Score and Plan: 3 and Midazolam  and Ondansetron   Airway Management Planned: Oral ETT  Additional Equipment:   Intra-op Plan:   Post-operative Plan: Extubation in OR  Informed Consent: I have reviewed the patients History and Physical, chart, labs and discussed the procedure including the risks, benefits and alternatives for the proposed anesthesia with the patient or authorized representative who has indicated his/her understanding and acceptance.     Dental advisory given  Plan Discussed with: CRNA  Anesthesia Plan Comments:          Anesthesia Quick Evaluation

## 2023-11-16 ENCOUNTER — Inpatient Hospital Stay (HOSPITAL_COMMUNITY)
Admission: RE | Admit: 2023-11-16 | Discharge: 2023-11-17 | DRG: 448 | Disposition: A | Discharge status: Home / Self Care | Attending: Orthopedic Surgery | Admitting: Orthopedic Surgery

## 2023-11-16 ENCOUNTER — Other Ambulatory Visit: Payer: Self-pay

## 2023-11-16 ENCOUNTER — Inpatient Hospital Stay (HOSPITAL_COMMUNITY): Payer: Self-pay

## 2023-11-16 ENCOUNTER — Encounter (HOSPITAL_COMMUNITY): Payer: Self-pay | Admitting: Orthopedic Surgery

## 2023-11-16 ENCOUNTER — Inpatient Hospital Stay (HOSPITAL_COMMUNITY)

## 2023-11-16 ENCOUNTER — Encounter (HOSPITAL_COMMUNITY)
Admission: RE | Disposition: A | Discharge status: Home / Self Care | Payer: Self-pay | Source: Home / Self Care | Attending: Orthopedic Surgery

## 2023-11-16 DIAGNOSIS — M797 Fibromyalgia: Secondary | ICD-10-CM | POA: Diagnosis not present

## 2023-11-16 DIAGNOSIS — M4800 Spinal stenosis, site unspecified: Principal | ICD-10-CM | POA: Diagnosis present

## 2023-11-16 DIAGNOSIS — G43909 Migraine, unspecified, not intractable, without status migrainosus: Secondary | ICD-10-CM | POA: Diagnosis not present

## 2023-11-16 DIAGNOSIS — H9191 Unspecified hearing loss, right ear: Secondary | ICD-10-CM | POA: Diagnosis not present

## 2023-11-16 DIAGNOSIS — Z833 Family history of diabetes mellitus: Secondary | ICD-10-CM | POA: Diagnosis not present

## 2023-11-16 DIAGNOSIS — M960 Pseudarthrosis after fusion or arthrodesis: Secondary | ICD-10-CM | POA: Diagnosis not present

## 2023-11-16 DIAGNOSIS — M48061 Spinal stenosis, lumbar region without neurogenic claudication: Secondary | ICD-10-CM | POA: Diagnosis present

## 2023-11-16 DIAGNOSIS — Z888 Allergy status to other drugs, medicaments and biological substances status: Secondary | ICD-10-CM

## 2023-11-16 DIAGNOSIS — E785 Hyperlipidemia, unspecified: Secondary | ICD-10-CM | POA: Diagnosis not present

## 2023-11-16 DIAGNOSIS — Z8249 Family history of ischemic heart disease and other diseases of the circulatory system: Secondary | ICD-10-CM

## 2023-11-16 DIAGNOSIS — Z472 Encounter for removal of internal fixation device: Secondary | ICD-10-CM | POA: Diagnosis not present

## 2023-11-16 DIAGNOSIS — Z8261 Family history of arthritis: Secondary | ICD-10-CM | POA: Diagnosis not present

## 2023-11-16 DIAGNOSIS — Z7982 Long term (current) use of aspirin: Secondary | ICD-10-CM | POA: Diagnosis not present

## 2023-11-16 DIAGNOSIS — Z880 Allergy status to penicillin: Secondary | ICD-10-CM

## 2023-11-16 DIAGNOSIS — T84296A Other mechanical complication of internal fixation device of vertebrae, initial encounter: Secondary | ICD-10-CM

## 2023-11-16 DIAGNOSIS — Z87891 Personal history of nicotine dependence: Secondary | ICD-10-CM

## 2023-11-16 DIAGNOSIS — M1990 Unspecified osteoarthritis, unspecified site: Secondary | ICD-10-CM | POA: Diagnosis not present

## 2023-11-16 DIAGNOSIS — Z981 Arthrodesis status: Secondary | ICD-10-CM | POA: Diagnosis not present

## 2023-11-16 DIAGNOSIS — I10 Essential (primary) hypertension: Secondary | ICD-10-CM | POA: Diagnosis present

## 2023-11-16 DIAGNOSIS — Z885 Allergy status to narcotic agent status: Secondary | ICD-10-CM | POA: Diagnosis not present

## 2023-11-16 DIAGNOSIS — Z825 Family history of asthma and other chronic lower respiratory diseases: Secondary | ICD-10-CM

## 2023-11-16 DIAGNOSIS — Z79899 Other long term (current) drug therapy: Secondary | ICD-10-CM | POA: Diagnosis not present

## 2023-11-16 DIAGNOSIS — Z8661 Personal history of infections of the central nervous system: Secondary | ICD-10-CM

## 2023-11-16 SURGERY — POSTERIOR LUMBAR FUSION 1 LEVEL
Anesthesia: General

## 2023-11-16 MED ORDER — ATORVASTATIN CALCIUM 40 MG PO TABS
40.0000 mg | ORAL_TABLET | Freq: Every day | ORAL | Status: DC
  Administered 2023-11-16: 40 mg via ORAL
  Filled 2023-11-16 (×2): qty 1

## 2023-11-16 MED ORDER — BUPIVACAINE LIPOSOME 1.3 % IJ SUSP
INTRAMUSCULAR | Status: DC | PRN
  Administered 2023-11-16: 20 mL

## 2023-11-16 MED ORDER — ALUM & MAG HYDROXIDE-SIMETH 200-200-20 MG/5ML PO SUSP: 30.0000 mL | Freq: Four times a day (QID) | ORAL | Status: DC | PRN

## 2023-11-16 MED ORDER — CHLORHEXIDINE GLUCONATE 0.12 % MT SOLN
15.0000 mL | Freq: Once | OROMUCOSAL | Status: AC
  Administered 2023-11-16: 15 mL via OROMUCOSAL

## 2023-11-16 MED ORDER — MIDAZOLAM HCL 2 MG/2ML IJ SOLN
INTRAMUSCULAR | Status: DC | PRN
  Administered 2023-11-16: 2 mg via INTRAVENOUS

## 2023-11-16 MED ORDER — DIPHENHYDRAMINE HCL 25 MG PO CAPS
50.0000 mg | ORAL_CAPSULE | Freq: Four times a day (QID) | ORAL | Status: DC | PRN
  Administered 2023-11-16 (×2): 50 mg via ORAL
  Filled 2023-11-16 (×2): qty 2

## 2023-11-16 MED ORDER — SCOPOLAMINE 1 MG/3DAYS TD PT72
1.0000 | MEDICATED_PATCH | TRANSDERMAL | Status: DC
  Administered 2023-11-16: 1 mg via TRANSDERMAL
  Filled 2023-11-16: qty 1

## 2023-11-16 MED ORDER — PREGABALIN 75 MG PO CAPS
150.0000 mg | ORAL_CAPSULE | Freq: Every day | ORAL | Status: DC
  Administered 2023-11-17: 150 mg via ORAL
  Filled 2023-11-16: qty 2

## 2023-11-16 MED ORDER — ROCURONIUM BROMIDE 10 MG/ML (PF) SYRINGE
PREFILLED_SYRINGE | INTRAVENOUS | Status: DC | PRN
  Administered 2023-11-16: 5 mg via INTRAVENOUS
  Administered 2023-11-16: 30 mg via INTRAVENOUS
  Administered 2023-11-16: 50 mg via INTRAVENOUS
  Administered 2023-11-16 (×3): 5 mg via INTRAVENOUS

## 2023-11-16 MED ORDER — ROCURONIUM BROMIDE 10 MG/ML (PF) SYRINGE
PREFILLED_SYRINGE | INTRAVENOUS | Status: AC
  Filled 2023-11-16: qty 10

## 2023-11-16 MED ORDER — LIDOCAINE 2% (20 MG/ML) 5 ML SYRINGE
INTRAMUSCULAR | Status: DC | PRN
  Administered 2023-11-16: 60 mg via INTRAVENOUS

## 2023-11-16 MED ORDER — ZOLPIDEM TARTRATE 5 MG PO TABS: 5.0000 mg | ORAL_TABLET | Freq: Every evening | ORAL | Status: DC | PRN

## 2023-11-16 MED ORDER — POTASSIUM CHLORIDE IN NACL 20-0.9 MEQ/L-% IV SOLN
INTRAVENOUS | Status: DC
  Filled 2023-11-16: qty 1000

## 2023-11-16 MED ORDER — EPHEDRINE SULFATE-NACL 50-0.9 MG/10ML-% IV SOSY
PREFILLED_SYRINGE | INTRAVENOUS | Status: DC | PRN
  Administered 2023-11-16: 10 mg via INTRAVENOUS

## 2023-11-16 MED ORDER — KETAMINE HCL 50 MG/5ML IJ SOSY
PREFILLED_SYRINGE | INTRAMUSCULAR | Status: AC
  Filled 2023-11-16: qty 5

## 2023-11-16 MED ORDER — ONDANSETRON HCL 4 MG/2ML IJ SOLN
INTRAMUSCULAR | Status: AC
  Filled 2023-11-16: qty 2

## 2023-11-16 MED ORDER — GABAPENTIN 300 MG PO CAPS
300.0000 mg | ORAL_CAPSULE | Freq: Once | ORAL | Status: AC
  Administered 2023-11-16: 300 mg via ORAL
  Filled 2023-11-16: qty 1

## 2023-11-16 MED ORDER — CEFAZOLIN SODIUM-DEXTROSE 1-4 GM/50ML-% IV SOLN
1.0000 g | Freq: Three times a day (TID) | INTRAVENOUS | Status: AC
  Administered 2023-11-16 (×2): 1 g via INTRAVENOUS
  Filled 2023-11-16 (×2): qty 50

## 2023-11-16 MED ORDER — SODIUM CHLORIDE 0.9% FLUSH
3.0000 mL | Freq: Two times a day (BID) | INTRAVENOUS | Status: DC
  Administered 2023-11-16 (×2): 3 mL via INTRAVENOUS

## 2023-11-16 MED ORDER — THROMBIN 20000 UNITS EX SOLR
CUTANEOUS | Status: AC
  Filled 2023-11-16: qty 20000

## 2023-11-16 MED ORDER — ONDANSETRON HCL 4 MG/2ML IJ SOLN: 4.0000 mg | Freq: Once | INTRAMUSCULAR | Status: DC | PRN

## 2023-11-16 MED ORDER — LACTATED RINGERS IV SOLN: INTRAVENOUS | Status: DC

## 2023-11-16 MED ORDER — FLUOXETINE HCL 20 MG PO CAPS
20.0000 mg | ORAL_CAPSULE | Freq: Every day | ORAL | Status: DC
  Administered 2023-11-17: 20 mg via ORAL
  Filled 2023-11-16: qty 1

## 2023-11-16 MED ORDER — ORAL CARE MOUTH RINSE: 15.0000 mL | Freq: Once | OROMUCOSAL | Status: DC

## 2023-11-16 MED ORDER — MENTHOL 3 MG MT LOZG
1.0000 | LOZENGE | OROMUCOSAL | Status: DC | PRN
Start: 2023-11-16 — End: 2023-11-17

## 2023-11-16 MED ORDER — PHENYLEPHRINE HCL-NACL 20-0.9 MG/250ML-% IV SOLN
INTRAVENOUS | Status: DC | PRN
  Administered 2023-11-16: 25 ug/min via INTRAVENOUS

## 2023-11-16 MED ORDER — CHLORHEXIDINE GLUCONATE 0.12 % MT SOLN
15.0000 mL | Freq: Once | OROMUCOSAL | Status: DC
  Filled 2023-11-16: qty 15

## 2023-11-16 MED ORDER — MIDAZOLAM HCL 2 MG/2ML IJ SOLN
INTRAMUSCULAR | Status: AC
  Filled 2023-11-16: qty 2

## 2023-11-16 MED ORDER — THROMBIN 20000 UNITS EX SOLR
CUTANEOUS | Status: DC | PRN
  Administered 2023-11-16: 20 mL

## 2023-11-16 MED ORDER — OXYCODONE-ACETAMINOPHEN 5-325 MG PO TABS
1.0000 | ORAL_TABLET | ORAL | Status: DC | PRN
  Administered 2023-11-16 – 2023-11-17 (×6): 2 via ORAL
  Filled 2023-11-16 (×6): qty 2

## 2023-11-16 MED ORDER — QUETIAPINE FUMARATE 25 MG PO TABS: 25.0000 mg | ORAL_TABLET | Freq: Every evening | ORAL | Status: DC | PRN

## 2023-11-16 MED ORDER — DIPHENHYDRAMINE HCL 25 MG PO CAPS
25.0000 mg | ORAL_CAPSULE | Freq: Four times a day (QID) | ORAL | Status: DC | PRN
Start: 2023-11-16 — End: 2023-11-16
  Filled 2023-11-16: qty 1

## 2023-11-16 MED ORDER — PHENYLEPHRINE 80 MCG/ML (10ML) SYRINGE FOR IV PUSH (FOR BLOOD PRESSURE SUPPORT)
PREFILLED_SYRINGE | INTRAVENOUS | Status: AC
Start: 2023-11-16 — End: 2023-11-16
  Filled 2023-11-16: qty 10

## 2023-11-16 MED ORDER — OXYBUTYNIN CHLORIDE 5 MG PO TABS
5.0000 mg | ORAL_TABLET | Freq: Two times a day (BID) | ORAL | Status: DC
Start: 2023-11-16 — End: 2023-11-17
  Administered 2023-11-16 – 2023-11-17 (×2): 5 mg via ORAL
  Filled 2023-11-16 (×3): qty 1

## 2023-11-16 MED ORDER — CEFAZOLIN SODIUM-DEXTROSE 2-4 GM/100ML-% IV SOLN
2.0000 g | INTRAVENOUS | Status: AC
  Administered 2023-11-16: 2 g via INTRAVENOUS
  Filled 2023-11-16: qty 100

## 2023-11-16 MED ORDER — ACETAMINOPHEN 650 MG RE SUPP
650.0000 mg | RECTAL | Status: DC | PRN
Start: 2023-11-16 — End: 2023-11-17

## 2023-11-16 MED ORDER — KETAMINE HCL 10 MG/ML IJ SOLN
INTRAMUSCULAR | Status: DC | PRN
  Administered 2023-11-16: 25 mg via INTRAVENOUS

## 2023-11-16 MED ORDER — SODIUM CHLORIDE 0.9 % IV SOLN
250.0000 mL | INTRAVENOUS | Status: AC
  Administered 2023-11-16: 250 mL via INTRAVENOUS

## 2023-11-16 MED ORDER — AMISULPRIDE (ANTIEMETIC) 5 MG/2ML IV SOLN: 10.0000 mg | Freq: Once | INTRAVENOUS | Status: DC | PRN

## 2023-11-16 MED ORDER — MAGNESIUM GLUCONATE 500 (27 MG) MG PO TABS
500.0000 mg | ORAL_TABLET | Freq: Every day | ORAL | Status: DC
  Administered 2023-11-16: 500 mg via ORAL
  Filled 2023-11-16 (×2): qty 1

## 2023-11-16 MED ORDER — EPHEDRINE 5 MG/ML INJ
INTRAVENOUS | Status: AC
Start: 2023-11-16 — End: 2023-11-16
  Filled 2023-11-16: qty 5

## 2023-11-16 MED ORDER — ACETAMINOPHEN 325 MG PO TABS: 650.0000 mg | ORAL_TABLET | ORAL | Status: DC | PRN

## 2023-11-16 MED ORDER — DEXAMETHASONE SODIUM PHOSPHATE 10 MG/ML IJ SOLN
INTRAMUSCULAR | Status: AC
  Filled 2023-11-16: qty 1

## 2023-11-16 MED ORDER — ORAL CARE MOUTH RINSE: 15.0000 mL | Freq: Once | OROMUCOSAL | Status: AC

## 2023-11-16 MED ORDER — LIDOCAINE 2% (20 MG/ML) 5 ML SYRINGE
INTRAMUSCULAR | Status: AC
  Filled 2023-11-16: qty 5

## 2023-11-16 MED ORDER — BUPIVACAINE LIPOSOME 1.3 % IJ SUSP
INTRAMUSCULAR | Status: AC
  Filled 2023-11-16: qty 20

## 2023-11-16 MED ORDER — ONDANSETRON HCL 4 MG/2ML IJ SOLN
INTRAMUSCULAR | Status: DC | PRN
  Administered 2023-11-16: 4 mg via INTRAVENOUS

## 2023-11-16 MED ORDER — 0.9 % SODIUM CHLORIDE (POUR BTL) OPTIME
TOPICAL | Status: DC | PRN
  Administered 2023-11-16: 1000 mL

## 2023-11-16 MED ORDER — AMLODIPINE BESYLATE 10 MG PO TABS
10.0000 mg | ORAL_TABLET | Freq: Every day | ORAL | Status: DC
  Filled 2023-11-16: qty 1

## 2023-11-16 MED ORDER — BUPIVACAINE-EPINEPHRINE 0.25% -1:200000 IJ SOLN
INTRAMUSCULAR | Status: DC | PRN
  Administered 2023-11-16: 20 mL
  Administered 2023-11-16: 7 mL

## 2023-11-16 MED ORDER — PHENOL 1.4 % MT LIQD: 1.0000 | OROMUCOSAL | Status: DC | PRN

## 2023-11-16 MED ORDER — SUGAMMADEX SODIUM 200 MG/2ML IV SOLN
INTRAVENOUS | Status: DC | PRN
  Administered 2023-11-16: 200 mg via INTRAVENOUS

## 2023-11-16 MED ORDER — PROPOFOL 10 MG/ML IV BOLUS
INTRAVENOUS | Status: AC
  Filled 2023-11-16: qty 20

## 2023-11-16 MED ORDER — PROPRANOLOL HCL ER 60 MG PO CP24
60.0000 mg | ORAL_CAPSULE | Freq: Every day | ORAL | Status: DC
  Filled 2023-11-16: qty 1

## 2023-11-16 MED ORDER — SODIUM CHLORIDE 0.9% FLUSH: 3.0000 mL | INTRAVENOUS | Status: DC | PRN

## 2023-11-16 MED ORDER — ONDANSETRON HCL 4 MG PO TABS
4.0000 mg | ORAL_TABLET | Freq: Four times a day (QID) | ORAL | Status: DC | PRN
  Administered 2023-11-17: 4 mg via ORAL
  Filled 2023-11-16: qty 1

## 2023-11-16 MED ORDER — FENTANYL CITRATE (PF) 250 MCG/5ML IJ SOLN
INTRAMUSCULAR | Status: DC | PRN
  Administered 2023-11-16 (×2): 50 ug via INTRAVENOUS
  Administered 2023-11-16: 100 ug via INTRAVENOUS

## 2023-11-16 MED ORDER — ACETAMINOPHEN 500 MG PO TABS
1000.0000 mg | ORAL_TABLET | Freq: Once | ORAL | Status: AC
  Administered 2023-11-16: 1000 mg via ORAL
  Filled 2023-11-16: qty 2

## 2023-11-16 MED ORDER — METHOCARBAMOL 750 MG PO TABS
750.0000 mg | ORAL_TABLET | Freq: Four times a day (QID) | ORAL | Status: DC | PRN
  Administered 2023-11-16 – 2023-11-17 (×2): 750 mg via ORAL
  Filled 2023-11-16 (×2): qty 1

## 2023-11-16 MED ORDER — PROPRANOLOL HCL ER 60 MG PO CP24
60.0000 mg | ORAL_CAPSULE | Freq: Every day | ORAL | Status: DC
  Administered 2023-11-16: 60 mg via ORAL
  Filled 2023-11-16 (×2): qty 1

## 2023-11-16 MED ORDER — PROPOFOL 10 MG/ML IV BOLUS
INTRAVENOUS | Status: DC | PRN
  Administered 2023-11-16: 120 mg via INTRAVENOUS
  Administered 2023-11-16: 30 mg via INTRAVENOUS
  Administered 2023-11-16: 20 ug/kg/min via INTRAVENOUS

## 2023-11-16 MED ORDER — FENTANYL CITRATE (PF) 250 MCG/5ML IJ SOLN
INTRAMUSCULAR | Status: AC
  Filled 2023-11-16: qty 5

## 2023-11-16 MED ORDER — FENTANYL CITRATE (PF) 100 MCG/2ML IJ SOLN
INTRAMUSCULAR | Status: AC
  Filled 2023-11-16: qty 2

## 2023-11-16 MED ORDER — BUPIVACAINE-EPINEPHRINE (PF) 0.25% -1:200000 IJ SOLN
INTRAMUSCULAR | Status: AC
Start: 2023-11-16 — End: 2023-11-16
  Filled 2023-11-16: qty 30

## 2023-11-16 MED ORDER — FENTANYL CITRATE (PF) 100 MCG/2ML IJ SOLN
25.0000 ug | INTRAMUSCULAR | Status: DC | PRN
  Administered 2023-11-16 (×2): 25 ug via INTRAVENOUS

## 2023-11-16 MED ORDER — ADULT MULTIVITAMIN W/MINERALS CH
1.0000 | ORAL_TABLET | Freq: Every day | ORAL | Status: DC
  Administered 2023-11-17: 1 via ORAL
  Filled 2023-11-16: qty 1

## 2023-11-16 MED ORDER — PHENYLEPHRINE 80 MCG/ML (10ML) SYRINGE FOR IV PUSH (FOR BLOOD PRESSURE SUPPORT)
PREFILLED_SYRINGE | INTRAVENOUS | Status: DC | PRN
  Administered 2023-11-16: 160 ug via INTRAVENOUS

## 2023-11-16 MED ORDER — LORATADINE 10 MG PO TABS
10.0000 mg | ORAL_TABLET | Freq: Every day | ORAL | Status: DC
  Administered 2023-11-17: 10 mg via ORAL
  Filled 2023-11-16: qty 1

## 2023-11-16 MED ORDER — EVOLOCUMAB 140 MG/ML ~~LOC~~ SOAJ: 140.0000 mg | SUBCUTANEOUS | Status: DC

## 2023-11-16 MED ORDER — POVIDONE-IODINE 7.5 % EX SOLN
Freq: Once | CUTANEOUS | Status: DC
  Filled 2023-11-16: qty 118

## 2023-11-16 SURGICAL SUPPLY — 69 items
BAG COUNTER SPONGE SURGICOUNT (BAG) ×1 IMPLANT
BENZOIN TINCTURE PRP APPL 2/3 (GAUZE/BANDAGES/DRESSINGS) ×1 IMPLANT
BLADE CLIPPER SURG (BLADE) IMPLANT
BUR PRECISION FLUTE 5.0 (BURR) ×1 IMPLANT
BUR PRESCISION 1.7 ELITE (BURR) ×1 IMPLANT
BUR ROUND PRECISION 4.0 (BURR) IMPLANT
BUR SABER RD CUTTING 3.0 (BURR) IMPLANT
CNTNR URN SCR LID CUP LEK RST (MISCELLANEOUS) ×1 IMPLANT
COVER MAYO STAND STRL (DRAPES) ×2 IMPLANT
COVER SURGICAL LIGHT HANDLE (MISCELLANEOUS) ×1 IMPLANT
DRAPE C-ARM 42X72 X-RAY (DRAPES) ×1 IMPLANT
DRAPE C-ARMOR (DRAPES) IMPLANT
DRAPE POUCH INSTRU U-SHP 10X18 (DRAPES) ×1 IMPLANT
DRAPE SURG 17X23 STRL (DRAPES) ×4 IMPLANT
DURAPREP 26ML APPLICATOR (WOUND CARE) ×1 IMPLANT
ELECT CAUTERY BLADE 6.4 (BLADE) ×1 IMPLANT
ELECTRODE BLDE 4.0 EZ CLN MEGD (MISCELLANEOUS) ×1 IMPLANT
ELECTRODE REM PT RTRN 9FT ADLT (ELECTROSURGICAL) ×1 IMPLANT
EVACUATOR SILICONE 100CC (DRAIN) IMPLANT
FILTER STRAW FLUID ASPIR (MISCELLANEOUS) ×1 IMPLANT
GAUZE 4X4 16PLY ~~LOC~~+RFID DBL (SPONGE) ×1 IMPLANT
GAUZE SPONGE 4X4 12PLY STRL (GAUZE/BANDAGES/DRESSINGS) ×1 IMPLANT
GLOVE BIO SURGEON STRL SZ 6.5 (GLOVE) ×1 IMPLANT
GLOVE BIO SURGEON STRL SZ8 (GLOVE) ×1 IMPLANT
GLOVE BIOGEL PI IND STRL 7.0 (GLOVE) ×1 IMPLANT
GLOVE BIOGEL PI IND STRL 8 (GLOVE) ×1 IMPLANT
GLOVE SURG ENC MOIS LTX SZ6.5 (GLOVE) ×1 IMPLANT
GOWN STRL REUS W/ TWL LRG LVL3 (GOWN DISPOSABLE) ×2 IMPLANT
GOWN STRL REUS W/ TWL XL LVL3 (GOWN DISPOSABLE) ×1 IMPLANT
IV CATH 14GX2 1/4 (CATHETERS) ×1 IMPLANT
KIT BASIN OR (CUSTOM PROCEDURE TRAY) ×1 IMPLANT
KIT INFUSE X SMALL 1.4CC (Orthopedic Implant) IMPLANT
KIT POSITIONER JACKSON TABLE (MISCELLANEOUS) ×1 IMPLANT
KIT TURNOVER KIT B (KITS) ×1 IMPLANT
MARKER SKIN DUAL TIP RULER LAB (MISCELLANEOUS) ×2 IMPLANT
NDL 18GX1X1/2 (RX/OR ONLY) (NEEDLE) ×1 IMPLANT
NDL 22X1.5 STRL (OR ONLY) (MISCELLANEOUS) ×2 IMPLANT
NDL HYPO 25GX1X1/2 BEV (NEEDLE) ×1 IMPLANT
NDL SPNL 18GX3.5 QUINCKE PK (NEEDLE) ×2 IMPLANT
NEEDLE 18GX1X1/2 (RX/OR ONLY) (NEEDLE) ×1 IMPLANT
NEEDLE 22X1.5 STRL (OR ONLY) (MISCELLANEOUS) ×2 IMPLANT
NEEDLE HYPO 25GX1X1/2 BEV (NEEDLE) ×1 IMPLANT
NEEDLE SPNL 18GX3.5 QUINCKE PK (NEEDLE) ×2 IMPLANT
NS IRRIG 1000ML POUR BTL (IV SOLUTION) ×1 IMPLANT
PACK LAMINECTOMY ORTHO (CUSTOM PROCEDURE TRAY) ×1 IMPLANT
PACK UNIVERSAL I (CUSTOM PROCEDURE TRAY) ×1 IMPLANT
PAD ARMBOARD POSITIONER FOAM (MISCELLANEOUS) ×2 IMPLANT
PATTIES SURGICAL .5 X1 (DISPOSABLE) ×1 IMPLANT
PATTIES SURGICAL .5X1.5 (GAUZE/BANDAGES/DRESSINGS) ×1 IMPLANT
PUTTY BONE DBX 5CC MIX (Putty) IMPLANT
ROD PRE BENT EXP 40MM (Rod) IMPLANT
SCREW EXPEDIUM POLYAXIAL 7X40M (Screw) IMPLANT
SCREW SET SINGLE INNER (Screw) IMPLANT
SPONGE INTESTINAL PEANUT (DISPOSABLE) ×1 IMPLANT
SPONGE SURGIFOAM ABS GEL 100 (HEMOSTASIS) ×1 IMPLANT
STRIP CLOSURE SKIN 1/2X4 (GAUZE/BANDAGES/DRESSINGS) ×2 IMPLANT
SURGIFLO W/THROMBIN 8M KIT (HEMOSTASIS) IMPLANT
SUT MNCRL AB 4-0 PS2 18 (SUTURE) ×1 IMPLANT
SUT VIC AB 0 CT1 18XCR BRD 8 (SUTURE) ×1 IMPLANT
SUT VIC AB 1 CT1 18XCR BRD 8 (SUTURE) ×1 IMPLANT
SUT VIC AB 2-0 CT2 18 VCP726D (SUTURE) ×1 IMPLANT
SYR 20ML LL LF (SYRINGE) ×2 IMPLANT
SYR BULB IRRIG 60ML STRL (SYRINGE) ×1 IMPLANT
SYR CONTROL 10ML LL (SYRINGE) ×2 IMPLANT
SYR TB 1ML LUER SLIP (SYRINGE) ×1 IMPLANT
TAP EXPEDIUM DL 7X2 (INSTRUMENTS) IMPLANT
TRAY FOLEY MTR SLVR 16FR STAT (SET/KITS/TRAYS/PACK) ×1 IMPLANT
WATER STERILE IRR 1000ML POUR (IV SOLUTION) ×1 IMPLANT
YANKAUER SUCT BULB TIP NO VENT (SUCTIONS) ×1 IMPLANT

## 2023-11-16 NOTE — Plan of Care (Signed)

## 2023-11-16 NOTE — H&P (Signed)
 PREOPERATIVE H&P  Chief Complaint: Low back pain  HPI: Desiree Thomas is a 63 y.o. 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  Patient has failed multiple forms of conservative care and continues to have pain (see office notes for additional details regarding the patient's full course of treatment)  Past Medical History:  Diagnosis Date   Allergy    Arthritis    Asthma    no inhaler - as a young adult, no current problems   Chronic back pain    Depression    Fibromyalgia    GERD (gastroesophageal reflux disease)    no meds, diet controlled   Hearing loss    45% on right ear   History of meningitis    1992   Hyperlipidemia    Hypertension    pt denies, she said she takes propranolol  for migraines   Migraine    Past Surgical History:  Procedure Laterality Date   ABDOMINAL HYSTERECTOMY     still has ovaries   ANTERIOR LAT LUMBAR FUSION Left 02/18/2022   Procedure: LEFT-SIDED LUMBAR 3 - LUMBAR 4 LATERAL INTERBODY FUSION WITH INSTRUMENTATION AND ALLOGRAFT;  Surgeon: Beuford Anes, MD;  Location: [facility] OR;  Service: Orthopedics;  Laterality: Left;   APPENDECTOMY     CESAREAN SECTION     x2   COLONOSCOPY  2019   cyst removed from ankle     DILATION AND CURETTAGE OF UTERUS     HAND SURGERY Right    2025   KNEE SURGERY  02/2013   KNEE SURGERY Bilateral    arthroscopy   LUMBAR FUSION  02/2014   LUMBAR SPINE SURGERY     fusion   trigger finger of thumb Bilateral    TRIGGER FINGER RELEASE Right 09/21/2021   Procedure: RIGHT RING AND SMALL TRIGGER FINGER RELEASE and INJECTIONS OF RIGHT INDEX AND LONG FINGER;  Surgeon: Doll Skates, MD;  Location: [facility] OR;  Service: Orthopedics;  Laterality: Right;   Social History   Socioeconomic History   Marital status: Married    Spouse name: Not on file   Number of children: 2   Years of education: Not on file   Highest education level: Master's degree  (e.g., MA, MS, MEng, MEd, MSW, MBA)  Occupational History   Not on file  Tobacco Use   Smoking status: Former    Current packs/day: 0.00    Average packs/day: 1 pack/day for 33.0 years (33.0 ttl pk-yrs)    Types: Cigarettes    Start date: 02/12/1989    Quit date: 02/17/2022    Years since quitting: 1.7   Smokeless tobacco: Never   Tobacco comments:    Since 2012  Vaping Use   Vaping status: Former   Substances: Nicotine, Flavoring  Substance and Sexual Activity   Alcohol  use: Not Currently   Drug use: Yes    Frequency: 3.0 times per week    Types: Marijuana    Comment: a blunt per day, states it helps with her pain   Sexual activity: Yes    Birth control/protection: Surgical    Comment: hysterectomy  Other Topics Concern   Not on file  Social History Narrative   Married, Christian.  Disabled.  Has 2 children .  08/2020      Caffeine: 1 cup coffee/day, 1/2 pepsi per day, maybe some tea   Social Drivers of Corporate investment banker Strain:  Low Risk  (10/28/2023)   Overall Financial Resource Strain (CARDIA)    Difficulty of Paying Living Expenses: Not very hard  Food Insecurity: No Food Insecurity (10/28/2023)   Hunger Vital Sign    Worried About Running Out of Food in the Last Year: Never true    Ran Out of Food in the Last Year: Never true  Transportation Needs: No Transportation Needs (10/28/2023)   PRAPARE - Administrator, Civil Service (Medical): No    Lack of Transportation (Non-Medical): No  Physical Activity: Insufficiently Active (10/28/2023)   Exercise Vital Sign    Days of Exercise per Week: 3 days    Minutes of Exercise per Session: 30 min  Stress: No Stress Concern Present (10/28/2023)   [employer] of Occupational Health - Occupational Stress Questionnaire    Feeling of Stress: Only a little  Social Connections: Socially Integrated (10/28/2023)   Social Connection and Isolation Panel    Frequency of Communication with Friends and Family:  More than three times a week    Frequency of Social Gatherings with Friends and Family: Once a week    Attends Religious Services: More than 4 times per year    Active Member of [employer] or Organizations: Yes    Attends Engineer, structural: More than 4 times per year    Marital Status: Married   Family History  Problem Relation Age of Onset   Arthritis Mother    Heart disease Mother    Asthma Mother    Diverticulitis Mother    Migraines Mother    Diabetes Father    Diverticulitis Father    Arthritis Sister    Asthma Sister    Migraines Sister    Arthritis Sister    Asthma Sister    Cancer Maternal Grandmother        stomach   Diabetes Paternal Grandmother    Dementia Paternal Grandmother    Migraines Daughter    Allergies  Allergen Reactions   Morphine Anaphylaxis   Penicillins Hives and Itching   Percocet [Oxycodone -Acetaminophen ] Itching    OK with benadryl    Topamax [Topiramate] Other (See Comments)    Emotional    Vicodin Hp [Hydrocodone -Acetaminophen ] Itching    Severe    Prior to Admission medications   Medication Sig Start Date End Date Taking? Authorizing Provider  amLODipine  (NORVASC ) 10 MG tablet TAKE 1 TABLET(10 MG) BY MOUTH DAILY 10/03/23  Yes Tysinger, Alm RAMAN, PA-C  aspirin EC 81 MG tablet Take 81 mg by mouth daily. Swallow whole.   Yes [provider]  atorvastatin  (LIPITOR) 40 MG tablet TAKE 1 TABLET(40 MG) BY MOUTH DAILY 10/03/23  Yes Tysinger, Alm RAMAN, PA-C  estradiol  (ESTRACE  VAGINAL) 0.1 MG/GM vaginal cream Place 1 Applicatorful vaginally 3 (three) times a week. Patient taking differently: Place 1 Applicatorful vaginally 3 (three) times a week. Once weekly 06/24/23  Yes Tysinger, Alm RAMAN, PA-C  Evolocumab  (REPATHA  SURECLICK) 140 MG/ML SOAJ Inject 140 mg into the skin every 14 (fourteen) days. 06/13/23  Yes Tysinger, Alm RAMAN, PA-C  FLUoxetine  (PROZAC ) 20 MG capsule TAKE 1 CAPSULE(20 MG) BY MOUTH DAILY 08/12/23  Yes Tysinger, Alm RAMAN, PA-C   loratadine  (CLARITIN ) 10 MG tablet Take 10 mg by mouth daily.   Yes [provider]  magnesium  gluconate (MAGONATE) 500 (27 Mg) MG TABS tablet Take 500 mg by mouth at bedtime.   Yes [provider]  methocarbamol  (ROBAXIN ) 750 MG tablet TAKE 1 TABLET TWICE DAILY AS NEEDED FOR  MUSCLE SPASM(S) 10/17/23  Yes Tysinger, Alm RAMAN, PA-C  Multiple Vitamins-Minerals (MULTIVITAMIN WITH MINERALS) tablet Take 1 tablet by mouth daily.   Yes [provider]  naproxen  (NAPROSYN ) 375 MG tablet Take 375 mg by mouth 2 (two) times daily as needed for mild pain (pain score 1-3).   Yes [provider]  oxybutynin  (DITROPAN ) 5 MG tablet Take 1 tablet (5 mg total) by mouth 2 (two) times daily. 08/29/23  Yes Dahlstedt, Garnette, MD  pregabalin  (LYRICA ) 150 MG capsule TAKE 1 CAPSULE BY MOUTH EVERY MORNING, 1 CAPSULE EVERY AFTERNOON, AND 1 CAPSULE EVERY EVENING 07/28/23  Yes Tysinger, Alm RAMAN, PA-C  propranolol  ER (INDERAL  LA) 60 MG 24 hr capsule TAKE 1 CAPSULE(60 MG) BY MOUTH AT BEDTIME 08/22/23  Yes Tysinger, Alm RAMAN, PA-C  QUEtiapine  (SEROQUEL ) 25 MG tablet Take 1 tablet (25 mg total) by mouth at bedtime as needed (sleep). 08/12/23  Yes Tysinger, Alm RAMAN, PA-C  traMADol  (ULTRAM ) 50 MG tablet Take 50 mg by mouth 3 (three) times daily as needed for moderate pain (pain score 4-6). 07/15/23  Yes [provider]  promethazine  (PHENERGAN ) 12.5 MG tablet Take 12.5 mg by mouth every 6 (six) hours as needed for nausea or vomiting.    [provider]  trolamine salicylate (ASPERCREME) 10 % cream Apply 1 Application topically as needed for muscle pain.    [provider]     All other systems have been reviewed and were otherwise negative with the exception of those mentioned in the HPI and as above.  Physical Exam: Vitals:   11/16/23 0643  BP: 125/80  Pulse: 71  Resp: 18  Temp: 99.4 F (37.4 C)  SpO2: 97%    Body mass index is 30.18 kg/m.  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

## 2023-11-16 NOTE — Anesthesia Postprocedure Evaluation (Signed)
 Anesthesia Post Note  Patient: Desiree Thomas  Procedure(s) Performed: POSTERIOR LUMBAR FUSION 1 LEVEL     Patient location during evaluation: PACU Anesthesia Type: General Level of consciousness: awake and alert Pain management: pain level controlled Vital Signs Assessment: post-procedure vital signs reviewed and stable Respiratory status: spontaneous breathing, nonlabored ventilation and respiratory function stable Cardiovascular status: blood pressure returned to baseline and stable Postop Assessment: no apparent nausea or vomiting Anesthetic complications: no   There were no known notable events for this encounter.  Last Vitals:  Vitals:   11/16/23 1300 11/16/23 1315  BP: 124/73 120/82  Pulse: 62 64  Resp: 12 13  Temp:  36.5 C  SpO2: 93% 96%    Last Pain:  Vitals:   11/16/23 1315  TempSrc:   PainSc: 5                  Garnette FORBES Skillern

## 2023-11-16 NOTE — Anesthesia Procedure Notes (Signed)
 Procedure Name: Intubation Date/Time: 11/16/2023 8:43 AM  Performed by: Kearney Rosina SAILOR, RNPre-anesthesia Checklist: Patient identified, Emergency Drugs available, Suction available and Patient being monitored Patient Re-evaluated:Patient Re-evaluated prior to induction Oxygen Delivery Method: Circle System Utilized Preoxygenation: Pre-oxygenation with 100% oxygen Induction Type: IV induction Ventilation: Mask ventilation without difficulty Laryngoscope Size: Mac and 4 Grade View: Grade I Tube type: Oral Tube size: 6.5 mm Number of attempts: 1 Airway Equipment and Method: Stylet and Oral airway Placement Confirmation: ETT inserted through vocal cords under direct vision, positive ETCO2 and breath sounds checked- equal and bilateral Secured at: 21 cm Tube secured with: Tape Dental Injury: Teeth and Oropharynx as per pre-operative assessment  Comments: atraumatic

## 2023-11-16 NOTE — Op Note (Signed)
 PATIENT NAME: Desiree Thomas   MEDICAL RECORD NO.:   [id number]    DATE OF BIRTH: 04-13-1960   DATE OF PROCEDURE: 11/16/2023                                OPERATIVE REPORT     PREOPERATIVE DIAGNOSES: 1. Status post L3/4 lateral /posterior fusion 2. Status post L4-S1 posterior fusion 3. Symptomatic nonunion, L3/4   POSTOPERATIVE DIAGNOSES:   1. Status post L3/4 lateral /posterior fusion 2. Status post L4-S1 posterior fusion 3. Symptomatic nonunion, L3/4     PROCEDURE: 1. Revision posterior spinal fusion, L3/4 2. Posterior decompression L3/4 3. Removal and replacement of posterior instrumentation L3, L4 4. Use of morselized allograft - DBX mix 5. Intraoperative use of floroscopy   SURGEON:  Oneil Priestly, MD   ASSISTANT:  Camellia Ellen, PA-C   ANESTHESIA:  General endotracheal anesthesia.   COMPLICATIONS:  None.   DISPOSITION:  Stable.   ESTIMATED BLOOD LOSS:  125cc   INDICATIONS FOR SURGERY: Briefly, Ms. Hogston is status post an anterior and lateral lumbar fusion as noted above.  She did well after surgery, but then sustained a fall, and complained of significant pain in the low back.  A CAT scan was obtained and was consistent with a nonunion at L3-4.  We did discuss proceeding with revision surgery.  She did wish to proceed.   OPERATIVE DETAILS:  On 11/16/2023, the patient was brought to surgery and general endotracheal anesthesia was administered.  The patient was placed prone onto a Jackson spinal bed.  The back was then prepped and draped in the usual sterile fashion.  I then made a midline incision spanning L3-L5.  The paraspinal musculature was incised at the midline, and retracted laterally.  The bilateral hardware was identified.  The caps were removed and the interconnecting rods were also removed.  Of note, the screws that were in place were extremely difficult to remove.  I did use a series of screwdrivers, and was not able to remove the bilateral L4 and L5 pedicle  screws.  I did make a decision to retain the screws, as I did feel that additional attempts at removal were not worth the risk associated with the additional attempts, and I did not feel that retaining the screws would result in any negative sequelae whatsoever.  The left L3 pedicle screw was removed, and was noted to be significantly loose, as per the CT images.  I then explored the fusion at L4-5 and L5-S1.  The facet joints on the left side were subperiosteally exposed, and I did perform a pushing and pulling maneuver across the L4, L5, and S1 pedicle screws, and there was no motion identified.  This did confirm a successful fusion at L4-5 and L5-S1.  At L3, the bilateral pedicles were cannulated using a high-speed bur and a gearshift probe.  7 x 40 mm screws were placed.  I then suppressed to expose the right and left posterolateral gutter, including the bilateral facet joints and transverse processes.  These were decorticated using a high-speed bur.  In the posterolateral gutter on the right and left sides, I did place BMP as well as DBX mix. Them using kerrrison punches and a high-speed bur, a bilateral partial facetectomy was performed, additionally decompressing the right and left lateral recesses. I was very pleased with the decompression.    I then placed a 7.5 x 40 mm screw at L4  on the left, and then on the right.  I did use AP and lateral fluoroscopy while placing the hardware.  I then secured 45 mm rods bilaterally into the L3 and L4 pedicle screws.  Caps were placed and a final locking procedure was performed.  I was very pleased with the final AP and lateral fluoroscopic images.  The fascia was closed using #1 Vicryl.  The subcutaneous layer was closed using 0 Vicryl followed by 2-0 Vicryl, and the skin was then closed using 4-0 Monocryl. Benzoin and Steri-Strips were applied followed by sterile dressing.  All instrument counts were correct at the termination of the procedure.    Of  note, Camellia Ellen, PA-C was my assistant throughout surgery, and did aid in retraction, suctioning, placement of the hardware, the fusion, and closure for the entire procedure.     Oneil Priestly, MD

## 2023-11-16 NOTE — Transfer of Care (Signed)
 Immediate Anesthesia Transfer of Care Note  Patient: Desiree Thomas  Procedure(s) Performed: POSTERIOR LUMBAR FUSION 1 LEVEL  Patient Location: PACU  Anesthesia Type:General  Level of Consciousness: drowsy and patient cooperative  Airway & Oxygen Therapy: Patient Spontanous Breathing and Patient connected to face mask oxygen  Post-op Assessment: Report given to RN and Post -op Vital signs reviewed and stable  Post vital signs: Reviewed and stable  Last Vitals:  Vitals Value Taken Time  BP 152/102 11/16/23 12:15  Temp 36.7 C 11/16/23 12:13  Pulse 68 11/16/23 12:16  Resp 14 11/16/23 12:16  SpO2 93 % 11/16/23 12:16  Vitals shown include unfiled device data.  Last Pain:  Vitals:   11/16/23 0710  TempSrc:   PainSc: 9          Complications: There were no known notable events for this encounter.

## 2023-11-17 MED ORDER — OXYCODONE-ACETAMINOPHEN 5-325 MG PO TABS: 1.0000 | ORAL_TABLET | ORAL | 0 refills | Status: DC | PRN

## 2023-11-17 MED ORDER — METHOCARBAMOL 750 MG PO TABS: 750.0000 mg | ORAL_TABLET | Freq: Four times a day (QID) | ORAL | 2 refills | Status: DC | PRN

## 2023-11-17 NOTE — Progress Notes (Signed)
    Patient doing well    Physical Exam: Vitals:   11/16/23 2327 11/17/23 0342  BP: 117/64 108/64  Pulse: 73 71  Resp: 18 18  Temp: 98.5 F (36.9 C) 99.1 F (37.3 C)  SpO2: 94% 100%    Dressing in place NVI  POD #1 s/p revision fusion and decompression at L3/4   - up with PT/OT, encourage ambulation - Percocet for pain, Robaxin  for muscle spasms - d/c home today with f/u in 2 weeks

## 2023-11-17 NOTE — Evaluation (Addendum)
 Physical Therapy Evaluation and Discharge  Patient Details Name: Desiree Thomas MRN: [id number] DOB: [birth year] Today's Date: 11/17/2023  History of Present Illness  Desiree Thomas is a 63 y.o. 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 She is s/p lumbar revision sx 9/3. PMHx: arthritis, asthma, depression, fibromyalgia, HLD, HTN   Clinical Impression  Patient evaluated by Physical Therapy with no further acute PT needs identified. All education has been completed and the patient has no further questions. Pt was able to demonstrate transfers and ambulation with gross modified independence and RW for support. Pt was independently managing in bathroom upon entry. Pt was educated on precautions, brace application/wearing schedule, appropriate activity progression, and car transfer. See below for any follow-up Physical Therapy or equipment needs. PT is signing off. Thank you for this referral.       If plan is discharge home, recommend the following: Assistance with cooking/housework;Assist for transportation;Help with stairs or ramp for entrance   Can travel by private vehicle        Equipment Recommendations Rolling walker (2 wheels) (Youth)  Recommendations for Other Services       Functional Status Assessment Patient has had a recent decline in their functional status and demonstrates the ability to make significant improvements in function in a reasonable and predictable amount of time.     Precautions / Restrictions Precautions Precautions: Fall;Back Precaution Booklet Issued: Yes (comment) Recall of Precautions/Restrictions: Intact Precaution/Restrictions Comments: Reviewed handout and pt was cued for precautions during functional mobility. Required Braces or Orthoses: Spinal Brace Spinal Brace: Thoracolumbosacral orthotic;Applied in sitting position Restrictions Weight Bearing Restrictions Per Provider Order: No       Mobility  Bed Mobility               General bed mobility comments: Pt was received in bathroom and then to recliner at end of session    Transfers Overall transfer level: Modified independent Equipment used: Rolling walker (2 wheels)               General transfer comment: Pt demonstrated proper hand placement on seated surface for safety.    Ambulation/Gait Ambulation/Gait assistance: Modified independent (Device/Increase time) Gait Distance (Feet): 200 Feet Assistive device: Rolling walker (2 wheels) Gait Pattern/deviations: Step-through pattern, Decreased stride length, Trunk flexed Gait velocity: Decreased Gait velocity interpretation: <1.31 ft/sec, indicative of household ambulator   General Gait Details: Pt with heavier UE reliance on RW, causing RW to be pushed out in front of her with trunk flexion to lean. Able to make some corrective changes with cues but this appeared to be her position of comfort.  Stairs            Wheelchair Mobility     Tilt Bed    Modified Rankin (Stroke Patients Only)       Balance Overall balance assessment: Needs assistance Sitting-balance support: Feet supported Sitting balance-Leahy Scale: Good     Standing balance support: No upper extremity supported, During functional activity Standing balance-Leahy Scale: Good Standing balance comment: statically at the sink                             Pertinent Vitals/Pain Pain Assessment Pain Assessment: Faces Faces Pain Scale: Hurts even more Pain Location: back Pain Descriptors / Indicators: Discomfort, Grimacing, Guarding Pain Intervention(s): Limited activity within patient's tolerance, Monitored during session, Repositioned  Home Living Family/patient expects to be discharged to:: Private residence Living Arrangements: Spouse/significant other Available Help at Discharge: Family Type of Home: House Home Access: Stairs to enter Entrance  Stairs-Rails: Right Entrance Stairs-Number of Steps: 3   Home Layout: One level Home Equipment: Agricultural consultant (2 wheels);Cane - single point;BSC/3in1;Shower seat      Prior Function Prior Level of Function : Independent/Modified Independent;Driving;Working/employed             Mobility Comments: indep, no AD ADLs Comments: works as a Production designer, theatre/television/film at [employer] Extremity Assessment: Overall WFL for tasks assessed    Lower Extremity Assessment Lower Extremity Assessment: Generalized weakness (Mild; consistent with pre-op diagnosis)    Cervical / Trunk Assessment Cervical / Trunk Assessment: Back Surgery  Communication   Communication Communication: No apparent difficulties    Cognition Arousal: Alert Behavior During Therapy: WFL for tasks assessed/performed   PT - Cognitive impairments: No apparent impairments                         Following commands: Intact       Cueing Cueing Techniques: Verbal cues     General Comments General comments (skin integrity, edema, etc.): VSS on RA    Exercises     Assessment/Plan    PT Assessment Patient does not need any further PT services  PT Problem List Decreased strength;Decreased activity tolerance;Decreased balance;Decreased mobility;Decreased knowledge of use of DME;Decreased safety awareness;Decreased knowledge of precautions;Pain       PT Treatment Interventions DME instruction;Gait training;Stair training;Functional mobility training;Therapeutic activities;Therapeutic exercise;Balance training;Patient/family education    PT Goals (Current goals can be found in the Care Plan section)  Acute Rehab PT Goals Patient Stated Goal: Pt reports she would like to stay until tomorrow. PT Goal Formulation: With patient Time For Goal Achievement: 11/24/23 Potential to Achieve Goals: Good    Frequency Min 5X/week     Co-evaluation                AM-PAC PT 6 Clicks Mobility  Outcome Measure Help needed turning from your back to your side while in a flat bed without using bedrails?: None Help needed moving from lying on your back to sitting on the side of a flat bed without using bedrails?: None Help needed moving to and from a bed to a chair (including a wheelchair)?: None Help needed standing up from a chair using your arms (e.g., wheelchair or bedside chair)?: None Help needed to walk in hospital room?: None Help needed climbing 3-5 steps with a railing? : A Little 6 Click Score: 23    End of Session Equipment Utilized During Treatment: Gait belt;Back brace Activity Tolerance: Patient tolerated treatment well Patient left: in chair;with call bell/phone within reach Nurse Communication: Mobility status PT Visit Diagnosis: Unsteadiness on feet (R26.81);Pain Pain - part of body:  (incision site)    Time: 8953-8890 PT Time Calculation (min) (ACUTE ONLY): 23 min   Charges:   PT Evaluation $PT Eval Low Complexity: 1 Low PT Treatments $Gait Training: 8-22 mins PT General Charges $$ ACUTE PT VISIT: 1 Visit         Leita Sable, PT, DPT Acute Rehabilitation Services Secure Chat Preferred Office: [phone]   Leita JONETTA Sable 11/17/2023, 11:47 AM

## 2023-11-17 NOTE — Evaluation (Signed)
 Occupational Therapy Evaluation Patient Details Name: Desiree Thomas MRN: [id number] DOB: [birth year] Today's Date: 11/17/2023   History of Present Illness   Desiree Thomas is a 63 y.o. 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 She is s/p lumbar revision sx 9/3. PMHx: arthritis, asthma, depression, fibromyalgia, HLD, HTN     Clinical Impressions Desiree Thomas was evaluated s/p the above admission list. She is indep, lives with family and works at baseline. Upon evaluation the pt was limited by surgical pain, spinal precautions and decreased activity tolerance. Overall she demonstrated mod I ability to complete ADLs and functional mobility with RW. Pt had great recall and awareness of spinal precautions from prior surgeries. PT does not require further acute or follow up OT.      If plan is discharge home, recommend the following:   Assist for transportation;Assistance with cooking/housework     Functional Status Assessment   Patient has had a recent decline in their functional status and demonstrates the ability to make significant improvements in function in a reasonable and predictable amount of time.     Equipment Recommendations   None recommended by OT      Precautions/Restrictions   Precautions Precautions: Fall;Back Precaution Booklet Issued: Yes (comment) Recall of Precautions/Restrictions: Intact Required Braces or Orthoses: Spinal Brace Spinal Brace: Thoracolumbosacral orthotic;Applied in sitting position Restrictions Weight Bearing Restrictions Per Provider Order: No     Mobility Bed Mobility Overal bed mobility: Modified Independent             General bed mobility comments: great log roll technique    Transfers Overall transfer level: Modified independent Equipment used: Rolling walker (2 wheels)              Balance Overall balance assessment: Needs  assistance Sitting-balance support: Feet supported Sitting balance-Leahy Scale: Good     Standing balance support: No upper extremity supported, During functional activity Standing balance-Leahy Scale: Good Standing balance comment: statically at the sink           ADL either performed or assessed with clinical judgement   ADL Overall ADL's : Modified independent             General ADL Comments: Pt demonstrated mod I ability to complete ADLs and functional mobility with use of RW. Generalized supervision provided throughout for safety only. Pt complaining of significant pain throughout     Vision Baseline Vision/History: 1 Wears glasses Vision Assessment?: No apparent visual deficits     Perception Perception: Not tested       Praxis Praxis: Not tested       Pertinent Vitals/Pain Pain Assessment Pain Assessment: Faces Faces Pain Scale: Hurts even more Pain Location: back Pain Descriptors / Indicators: Discomfort, Grimacing, Guarding Pain Intervention(s): Limited activity within patient's tolerance     Extremity/Trunk Assessment Upper Extremity Assessment Upper Extremity Assessment: Overall WFL for tasks assessed   Lower Extremity Assessment Lower Extremity Assessment: Defer to PT evaluation   Cervical / Trunk Assessment Cervical / Trunk Assessment: Back Surgery   Communication Communication Communication: No apparent difficulties   Cognition Arousal: Alert Behavior During Therapy: WFL for tasks assessed/performed Cognition: No apparent impairments               Following commands: Intact       Cueing  General Comments   Cueing Techniques: Verbal cues  VSS on RA  Home Living Family/patient expects to be discharged to:: Private residence Living Arrangements: Spouse/significant other Available Help at Discharge: Family Type of Home: House Home Access: Stairs to enter Secretary/administrator of Steps: 3 Entrance  Stairs-Rails: Right Home Layout: One level     Bathroom Shower/Tub: Chief Strategy Officer: Standard     Home Equipment: Agricultural consultant (2 wheels);Cane - single point;BSC/3in1;Shower seat          Prior Functioning/Environment Prior Level of Function : Independent/Modified Independent;Driving;Working/employed             Mobility Comments: indep, no AD ADLs Comments: works as a Production designer, theatre/television/film at [employer]: Decreased activity tolerance;Decreased strength;Decreased range of motion;Decreased knowledge of use of DME or AE;Decreased knowledge of precautions        OT Goals(Current goals can be found in the care plan section)   Acute Rehab OT Goals Patient Stated Goal: less pain OT Goal Formulation: With patient Time For Goal Achievement: 12/01/23 Potential to Achieve Goals: Good   AM-PAC OT 6 Clicks Daily Activity     Outcome Measure Help from another person eating meals?: None Help from another person taking care of personal grooming?: None Help from another person toileting, which includes using toliet, bedpan, or urinal?: None Help from another person bathing (including washing, rinsing, drying)?: None Help from another person to put on and taking off regular upper body clothing?: None Help from another person to put on and taking off regular lower body clothing?: None 6 Click Score: 24   End of Session Equipment Utilized During Treatment: Rolling walker (2 wheels);Back brace Nurse Communication: Mobility status  Activity Tolerance: Patient tolerated treatment well Patient left: in bed;with call bell/phone within reach;with bed alarm set  OT Visit Diagnosis: Unsteadiness on feet (R26.81);Muscle weakness (generalized) (M62.81);Other abnormalities of gait and mobility (R26.89);Pain                Time: 9179-9158 OT Time Calculation (min): 21 min Charges:  OT General Charges $OT Visit: 1 Visit OT Evaluation $OT Eval Low Complexity: 1  Low  Desiree Thomas, OTR/L Acute Rehabilitation Services Office [phone] Secure Chat Communication Preferred   Desiree JONETTA Thomas 11/17/2023, 8:49 AM

## 2023-11-17 NOTE — Progress Notes (Signed)
 Patient alert and oriented, ambulated, void. Surgical site clean and dry no sign of infection. D/c instructions explain and given to the patient. All questions answered.

## 2023-11-23 NOTE — Discharge Summary (Signed)
 Patient ID: Desiree Thomas MRN: [id number] DOB/AGE: 04/27/1960 63 y.o.  Admit date: 11/16/2023 Discharge date: 11/17/2023  Admission Diagnoses:  Principal Problem:   Spinal stenosis   Discharge Diagnoses:  Same  Past Medical History:  Diagnosis Date   Allergy    Arthritis    Asthma    no inhaler - as a young adult, no current problems   Chronic back pain    Depression    Fibromyalgia    GERD (gastroesophageal reflux disease)    no meds, diet controlled   Hearing loss    45% on right ear   History of meningitis    1992   Hyperlipidemia    Hypertension    pt denies, she said she takes propranolol  for migraines   Migraine     Surgeries: Procedure(s): POSTERIOR LUMBAR FUSION 1 LEVEL on 11/16/2023   Consultants: None  Discharged Condition: Improved  Hospital Course: Desiree Thomas is an 63 y.o. female who was admitted 11/16/2023 for operative treatment of Spinal stenosis. Patient has severe unremitting pain that affects sleep, daily activities, and work/hobbies. After pre-op clearance the patient was taken to the operating room on 11/16/2023 and underwent  Procedure(s): POSTERIOR LUMBAR FUSION 1 LEVEL.    Patient was given perioperative antibiotics:  Anti-infectives (From admission, onward)    Start     Dose/Rate Route Frequency Ordered Stop   11/16/23 1700  ceFAZolin  (ANCEF ) IVPB 1 g/50 mL premix        1 g 100 mL/hr over 30 Minutes Intravenous Every 8 hours 11/16/23 1321 11/17/23 0838   11/16/23 0645  ceFAZolin  (ANCEF ) IVPB 2g/100 mL premix        2 g 200 mL/hr over 30 Minutes Intravenous On call to O.R. 11/16/23 9366 11/16/23 0911        Patient was given sequential compression devices, early ambulation to prevent DVT.  Patient benefited maximally from hospital stay and there were no complications.    Recent vital signs: BP (!) 114/58 (BP Location: Right Arm)   Pulse 69   Temp 99.3 F (37.4 C) (Oral)   Resp 16   Ht 5' 2 (1.575 m)   Wt 74.8 kg   SpO2 98%    BMI 30.18 kg/m    Discharge Medications:   Allergies as of 11/17/2023       Reactions   Morphine Anaphylaxis   Penicillins Hives, Itching   Percocet [oxycodone -acetaminophen ] Itching   OK with benadryl    Topamax [topiramate] Other (See Comments)   Emotional    Vicodin Hp [hydrocodone -acetaminophen ] Itching   Severe         Medication List     TAKE these medications    amLODipine  10 MG tablet Commonly known as: NORVASC  TAKE 1 TABLET(10 MG) BY MOUTH DAILY   aspirin EC 81 MG tablet Take 81 mg by mouth daily. Swallow whole.   atorvastatin  40 MG tablet Commonly known as: LIPITOR TAKE 1 TABLET(40 MG) BY MOUTH DAILY   estradiol  0.1 MG/GM vaginal cream Commonly known as: ESTRACE  VAGINAL Place 1 Applicatorful vaginally 3 (three) times a week. What changed: additional instructions   FLUoxetine  20 MG capsule Commonly known as: PROZAC  TAKE 1 CAPSULE(20 MG) BY MOUTH DAILY   loratadine  10 MG tablet Commonly known as: CLARITIN  Take 10 mg by mouth daily.   magnesium  gluconate 500 (27 Mg) MG Tabs tablet Commonly known as: MAGONATE Take 500 mg by mouth at bedtime.   methocarbamol  750 MG tablet Commonly known as: ROBAXIN  TAKE 1  TABLET TWICE DAILY AS NEEDED FOR MUSCLE SPASM(S) What changed: Another medication with the same name was added. Make sure you understand how and when to take each.   methocarbamol  750 MG tablet Commonly known as: ROBAXIN  Take 1 tablet (750 mg total) by mouth every 6 (six) hours as needed for muscle spasms. What changed: You were already taking a medication with the same name, and this prescription was added. Make sure you understand how and when to take each.   multivitamin with minerals tablet Take 1 tablet by mouth daily.   oxybutynin  5 MG tablet Commonly known as: DITROPAN  Take 1 tablet (5 mg total) by mouth 2 (two) times daily.   oxyCODONE -acetaminophen  5-325 MG tablet Commonly known as: PERCOCET/ROXICET Take 1-2 tablets by mouth every 4  (four) hours as needed for severe pain (pain score 7-10).   pregabalin  150 MG capsule Commonly known as: LYRICA  TAKE 1 CAPSULE BY MOUTH EVERY MORNING, 1 CAPSULE EVERY AFTERNOON, AND 1 CAPSULE EVERY EVENING   promethazine  12.5 MG tablet Commonly known as: PHENERGAN  Take 12.5 mg by mouth every 6 (six) hours as needed for nausea or vomiting.   propranolol  ER 60 MG 24 hr capsule Commonly known as: INDERAL  LA TAKE 1 CAPSULE(60 MG) BY MOUTH AT BEDTIME   QUEtiapine  25 MG tablet Commonly known as: SEROQUEL  Take 1 tablet (25 mg total) by mouth at bedtime as needed (sleep).   Repatha  SureClick 140 MG/ML Soaj Generic drug: Evolocumab  Inject 140 mg into the skin every 14 (fourteen) days.        Diagnostic Studies: DG Lumbar Spine 1 View Result Date: 11/16/2023 CLINICAL DATA:  Elective surgery. EXAM: LUMBAR SPINE - 1 VIEW COMPARISON:  CT of the lumbar spine dated 06/23/2023. FINDINGS: Status post revision of nonunion at L3-L4 with bilateral paired pedicular screws at L3. Interbody spacer remains at L3-L4. Partially visualized posterior fusion at L5-S1. Fluoroscopy time 21 seconds. Dose 15.22 mGy. IMPRESSION: Intraoperative fluoroscopy, as above. Electronically Signed   By: Harrietta Sherry M.D.   On: 11/16/2023 13:13   DG Lumbar Spine 1 View Result Date: 11/16/2023 CLINICAL DATA:  461500 Elective surgery 461500 EXAM: LUMBAR SPINE - 1 VIEW COMPARISON:  02/18/2022. FINDINGS: Redemonstrated posterior fusion at L4 through S1 with unilateral left-sided fusion at L3-L4. Intervertebral disc spacers in place. Surgical probes posterior to the L3 and L5 levels. IMPRESSION: Intraoperative localization for revision of nonunion at L3-L4. Electronically Signed   By: Harrietta Sherry M.D.   On: 11/16/2023 13:10   DG C-Arm 1-60 Min-No Report Result Date: 11/16/2023 Fluoroscopy was utilized by the requesting physician.  No radiographic interpretation.   DG C-Arm 1-60 Min-No Report Result Date:  11/16/2023 Fluoroscopy was utilized by the requesting physician.  No radiographic interpretation.    Disposition: Discharge disposition: 01-Home or Self Care       Discharge Instructions     Incentive spirometry RT   Complete by: As directed       POD #1 s/p revision fusion and decompression at L3/4    - up with PT/OT, encourage ambulation - Percocet for pain, Robaxin  for muscle spasm -Scripts for pain sent to pharmacy electronically  -D/C instructions sheet printed and in chart -D/C today  -F/U in office 2 weeks   Signed: Ileana PARAS Delbert Darley 11/23/2023, 12:46 PM

## 2023-12-12 DIAGNOSIS — Z981 Arthrodesis status: Secondary | ICD-10-CM | POA: Diagnosis not present

## 2023-12-12 DIAGNOSIS — M48061 Spinal stenosis, lumbar region without neurogenic claudication: Secondary | ICD-10-CM | POA: Diagnosis not present

## 2023-12-20 ENCOUNTER — Telehealth: Payer: Self-pay | Admitting: Medical

## 2023-12-20 ENCOUNTER — Other Ambulatory Visit: Payer: Self-pay | Admitting: Medical

## 2023-12-20 MED ORDER — PREGABALIN 150 MG PO CAPS: ORAL_CAPSULE | ORAL | 2 refills | Status: DC

## 2023-12-20 NOTE — Telephone Encounter (Signed)
 Fax from [facility]  Pregabalin  150

## 2023-12-21 ENCOUNTER — Other Ambulatory Visit: Payer: Self-pay | Admitting: Medical

## 2023-12-30 DIAGNOSIS — Z9889 Other specified postprocedural states: Secondary | ICD-10-CM | POA: Diagnosis not present

## 2024-01-06 ENCOUNTER — Other Ambulatory Visit: Payer: Self-pay | Admitting: Medical

## 2024-01-14 ENCOUNTER — Other Ambulatory Visit: Payer: Self-pay | Admitting: Medical

## 2024-01-25 ENCOUNTER — Ambulatory Visit (INDEPENDENT_AMBULATORY_CARE_PROVIDER_SITE_OTHER): Admitting: Medical

## 2024-01-25 ENCOUNTER — Ambulatory Visit: Payer: Self-pay

## 2024-01-25 VITALS — BP 110/72 | HR 88 | Temp 97.2°F | Wt 170.2 lb

## 2024-01-25 DIAGNOSIS — I493 Ventricular premature depolarization: Secondary | ICD-10-CM | POA: Diagnosis not present

## 2024-01-25 DIAGNOSIS — R002 Palpitations: Secondary | ICD-10-CM

## 2024-01-25 DIAGNOSIS — I700 Atherosclerosis of aorta: Secondary | ICD-10-CM | POA: Diagnosis not present

## 2024-01-25 DIAGNOSIS — R0789 Other chest pain: Secondary | ICD-10-CM | POA: Diagnosis not present

## 2024-01-25 DIAGNOSIS — I2510 Atherosclerotic heart disease of native coronary artery without angina pectoris: Secondary | ICD-10-CM

## 2024-01-25 NOTE — Patient Instructions (Addendum)
 PVC's/abnormal heart rhythm, chest spasm  Recommendations: Cut back on caffeine Drink 7-8 glasses of water daily Try and get 7-8 hours of sleep nightly, do your Seroquel  Limit stress Do some deep breathing and mediation twice daily You normally use Propranolol  nightly for headaches, but this week use Propranolol  twice daily to help with the symptoms and PVCs  We will call with lab results  We will make referral to cardiology   If worse symptoms, severe symptoms, or new symptoms recheck or call 911 if severe or worsen symptom     Premature Ventricular Contraction  A premature ventricular contraction (PVC) is a type of irregular heartbeat (arrhythmia). The heart has four chambers, including the upper chambers (atria) and lower chambers (ventricles). Normally, an electrical signal starts in a group of cells called the sinoatrial node (SA node) and travels through the atria, causing them to pump blood into the ventricles. During a PVC, the heartbeat starts in one of the lower ventricles. This may cause the heartbeat to be shorter and less effective. In most cases, PVCs come and go and do not require treatment. What are the causes? Common causes of the condition include: Heart attack or coronary artery disease (CAD). Heart valve problems. Heart surgery. Infection of the heart (myocarditis). Inflammation of the heart. In many cases, the cause of this condition is not known. What increases the risk? The following factors may make you more likely to develop this condition: Age, especially being over age 71. Being female. An imbalance of salts and minerals in the body (electrolytes). Low blood oxygen levels or high carbon dioxide levels. Certain medicines, including over-the-counter and prescribed medicines. High blood pressure. Obesity. Episodes may be triggered by: Vigorous exercise. Tobacco, alcohol , or caffeine use. Illegal drug use. Emotional stress. Poor or irregular  sleep. What are the signs or symptoms? The main symptoms of this condition are fast or irregular heartbeats (palpitations) or the feeling of a pause in the heartbeat. Other symptoms include: Shortness of breath. Difficulty exercising. Chest pain. Feeling tired. Dizziness. In some cases, there are no symptoms. How is this diagnosed? This condition may be diagnosed based on: Your medical history or symptoms. A physical exam. Your health care provider may listen to your heart. Tests, such as: Blood tests. An ECG (electrocardiogram) to monitor the electrical activity of your heart. An ambulatory cardiac monitor that records your heartbeats for 24 hours or more. Stress tests to see how exercise affects your heart rhythm and blood supply. An echocardiogram, which creates an image of your heart. An electrophysiology study (EPS) to check for electrical problems in your heart. How is this treated? Treatment for this condition depends on any underlying conditions, the type of PVC, how many PVCs you have had, and if the symptoms are affecting your daily life. Possible treatments include: Avoiding things that cause PVCs (triggers). These include caffeine, tobacco, and alcohol . Taking medicines if symptoms are severe or if the arrhythmias happen a lot. Getting treatment for underlying conditions that cause PVCs. Having an implantable cardioverter defibrillator (ICD) placed in the chest to monitor the heartbeat. The monitor sends a shock to the heart if it senses an arrhythmia and brings the heartbeat back to normal. Having a catheter ablation procedure to destroy the part of the heart tissue that sends abnormal signals. In many cases, no treatment is required. Follow these instructions at home: Lifestyle  Do not use any products that contain nicotine or tobacco. These products include cigarettes, chewing tobacco, and vaping devices, such as  e-cigarettes. If you need help quitting, ask your health  care provider. Do not use illegal drugs. Exercise regularly. Ask your health care provider what type of exercise is safe for you. Try to get at least 7-9 hours of sleep each night. Find healthy ways to manage stress. Avoid stressful situations when possible. Alcohol  use Do not drink alcohol  if: Your health care provider tells you not to drink. You are pregnant, may be pregnant, or are planning to become pregnant. Alcohol  triggers your episodes. If you drink alcohol : Limit how much you have to: 0-1 drink a day for women. 0-2 drinks a day for men. Know how much alcohol  is in your drink. In the [place] one drink equals one 12 oz bottle of beer (355 mL), one 5 oz glass of wine (148 mL), or one 1 oz glass of hard liquor (44 mL). General instructions Take over-the-counter and prescription medicines only as told by your health care provider. If caffeine triggers episodes of PVC, do not eat, drink, or use anything with caffeine in it. Contact a health care provider if: You feel palpitations often. You have nausea and vomiting. Get help right away if: You have chest pain. You have trouble breathing. You start sweating for no reason. You become light-headed or you faint. These symptoms may be an emergency. Get help right away. Call 911. Do not wait to see if the symptoms will go away. Do not drive yourself to the hospital. This information is not intended to replace advice given to you by your health care provider. Make sure you discuss any questions you have with your health care provider. Document Revised: 07/31/2021 Document Reviewed: 07/31/2021 Elsevier Patient Education  2024 [employer].

## 2024-01-25 NOTE — Progress Notes (Signed)
 Subjective:  Desiree Thomas is a 63 y.o. female who presents for Chief Complaint  Patient presents with   Acute Visit    Back surgery September 3rd and still healing but had a spasm under right breast and middle of breast, noticed it got bad right after eating jasmine rice. Then had it happen again last night and drank ginger ale to help it calm it down    She has been experiencing chest pain under her right breast for the past two days. The first episode occurred on Monday night 2 days ago, lasting about 30 minutes and causing significant distress. A subsequent episode occurred the following night, lasting 10 to 15 minutes and was less intense. The pain is not related to movement, and she attempted to alleviate symptoms with ginger ale, which provided some relief. She has not experienced similar symptoms before and is uncertain if it could be related to dietary factors, such as consuming Jasmine rice.  Her current medications include amlodipine , aspirin, Lipitor, Flexeril, Repatha , Prozac , magnesium , multivitamin, and propranolol , which she takes once daily for migraines. She has not been taking her supplements due to a recent surgery and is currently not using sleep aids like Seroquel  or Benadryl . She has a history of allergies to Vicodin, Topamax, Percocet, penicillin, and morphine. She has a history of cholesterol plaque in her arteries and aorta as noted in a chest CT from December of the previous year.  She reports increased caffeine intake over the past week, which may be contributing to her symptoms. She has not been tested for sleep apnea but reports difficulty sleeping, having only slept two hours the previous night. She has not been consuming alcohol .  She is currently off work and has been unable to exercise for three months due to a recent surgery.  no other aggravating or relieving factors.    No other c/o.  Past Medical History:  Diagnosis Date   Allergy    Arthritis    Asthma     no inhaler - as a young adult, no current problems   Chronic back pain    Depression    Fibromyalgia    GERD (gastroesophageal reflux disease)    no meds, diet controlled   Hearing loss    45% on right ear   History of meningitis    1992   Hyperlipidemia    Hypertension    pt denies, she said she takes propranolol  for migraines   Migraine    Current Outpatient Medications on File Prior to Visit  Medication Sig Dispense Refill   acetaminophen -codeine (TYLENOL  #3) 300-30 MG tablet Take 1 tablet by mouth every 6 (six) hours as needed.     amLODipine  (NORVASC ) 10 MG tablet TAKE 1 TABLET(10 MG) BY MOUTH DAILY 90 tablet 1   aspirin EC 81 MG tablet Take 81 mg by mouth daily. Swallow whole.     atorvastatin  (LIPITOR) 40 MG tablet TAKE 1 TABLET(40 MG) BY MOUTH DAILY 90 tablet 1   cyclobenzaprine (FLEXERIL) 5 MG tablet Take 5 mg by mouth.     Evolocumab  (REPATHA  SURECLICK) 140 MG/ML SOAJ Inject 140 mg into the skin every 14 (fourteen) days. 2 mL 8   FLUoxetine  (PROZAC ) 20 MG capsule TAKE 1 CAPSULE EVERY DAY 90 capsule 0   loratadine  (CLARITIN ) 10 MG tablet Take 10 mg by mouth daily.     magnesium  gluconate (MAGONATE) 500 (27 Mg) MG TABS tablet Take 500 mg by mouth at bedtime.     methocarbamol  (ROBAXIN )  750 MG tablet TAKE 1 TABLET TWICE DAILY AS NEEDED FOR MUSCLE SPASM(S) 60 tablet 0   Multiple Vitamins-Minerals (MULTIVITAMIN WITH MINERALS) tablet Take 1 tablet by mouth daily.     oxybutynin  (DITROPAN ) 5 MG tablet Take 1 tablet (5 mg total) by mouth 2 (two) times daily. 180 tablet 3   pregabalin  (LYRICA ) 150 MG capsule TAKE 1 CAPSULE BY MOUTH EVERY MORNING, 1 CAPSULE EVERY AFTERNOON, AND 1 CAPSULE EVERY EVENING 90 capsule 2   promethazine  (PHENERGAN ) 12.5 MG tablet Take 12.5 mg by mouth every 6 (six) hours as needed for nausea or vomiting.     propranolol  ER (INDERAL  LA) 60 MG 24 hr capsule TAKE 1 CAPSULE(60 MG) BY MOUTH AT BEDTIME 90 capsule 2   QUEtiapine  (SEROQUEL ) 25 MG tablet TAKE 1  TABLET AT BEDTIME 90 tablet 0   No current facility-administered medications on file prior to visit.     The following portions of the patient's history were reviewed and updated as appropriate: allergies, current medications, past family history, past medical history, past social history, past surgical history and problem list.  ROS Otherwise as in subjective above  Objective: BP 110/72   Pulse 88   Temp (!) 97.2 F (36.2 C)   Wt 170 lb 3.2 oz (77.2 kg)   SpO2 98%   BMI 31.13 kg/m   BP Readings from Last 3 Encounters:  01/25/24 110/72  11/17/23 (!) 114/58  11/09/23 (!) 143/77   Wt Readings from Last 3 Encounters:  01/25/24 170 lb 3.2 oz (77.2 kg)  11/16/23 165 lb (74.8 kg)  11/09/23 165 lb (74.8 kg)   General appearance: alert, no distress, well developed, well nourished Neck: supple, no lymphadenopathy, no thyromegaly, no masses, no JVD Heart: RRR, normal S1, S2, no murmurs Lungs: CTA bilaterally, no wheezes, rhonchi, or rales Chest nontender Abdomen: +bs, soft, non tender, non distended, no masses, no hepatomegaly, no splenomegaly Arms nontender, normal ROM Pulses: 2+ radial pulses, 2+ pedal pulses, normal cap refill Ext: no edema  EKG reviewed with new PVC and possible atrial enlargement    Assessment: Encounter Diagnoses  Name Primary?   Palpitation Yes   PVC (premature ventricular contraction)    Chest discomfort    Aortic atherosclerosis    Coronary artery disease involving native coronary artery of native heart without angina pectoris      Plan: We discussed her symptoms and concerns, possible differential.  EKG today shows PVCs which I suspect may be causing some of her symptoms.  We discussed possible triggers for PVCs.  Given her history of CAD on prior scan we will refer to cardiology.  She happens to be on propranolol  at night for headache prevention.  I will have her take this twice a day for the next several days while she make some  modifications with her caffeine intake and improves on sleep  If any worse symptoms in the next few days or severe symptoms then go to the emergency department or call 911   Recommendations: Cut back on caffeine Drink 7-8 glasses of water daily Try and get 7-8 hours of sleep nightly, do your Seroquel  Limit stress Do some deep breathing and mediation twice daily You normally use Propranolol  nightly for headaches, but this week use Propranolol  twice daily to help with the symptoms and PVCs  We will call with lab results  We will make referral to cardiology   If worse symptoms, severe symptoms, or new symptoms recheck or call 911 if severe or worsen symptom  Eowyn was seen today for acute visit.  Diagnoses and all orders for this visit:  Palpitation -     Basic metabolic panel with GFR -     CBC -     TSH -     EKG 12-Lead -     Ambulatory referral to Cardiology  PVC (premature ventricular contraction) -     EKG 12-Lead -     Ambulatory referral to Cardiology  Chest discomfort -     EKG 12-Lead -     Ambulatory referral to Cardiology  Aortic atherosclerosis  Coronary artery disease involving native coronary artery of native heart without angina pectoris    Follow up: pending labs

## 2024-01-25 NOTE — Telephone Encounter (Signed)
 FYI Only or Action Required?: FYI only for provider: appointment scheduled on 01/26/2024.  Patient was last seen in primary care on 06/23/2023 by Bulah Alm RAMAN, PA-C.  Called Nurse Triage reporting Chest Pain.  Symptoms began Over a week ago, pt unsure.  Interventions attempted: Other: Ginger ale.  Symptoms are: unchanged.  Triage Disposition: See Physician Within 24 Hours  Patient/caregiver understands and will follow disposition?: Yes       Copied from CRM [id number]. Topic: Clinical - Red Word Triage >> Jan 25, 2024  9:17 AM Roselie BROCKS wrote: [facility] that prompted transfer to Nurse Triage: Patient states she is having chest pains Reason for Disposition  [1] Chest pain lasts > 5 minutes AND [2] occurred > 3 days ago (72 hours) AND [3] NO chest pain or cardiac symptoms now  Answer Assessment - Initial Assessment Questions Denies tightness but states symptoms only occur at night. She believes symptoms maybe a result of eating rice. Drank ginger ale for symptoms. Pt advised to go ED for any worsening symptoms and verbalized understanding.     1. LOCATION: Where does it hurt?       Underneath R breast  2. RADIATION: Does the pain go anywhere else? (e.g., into neck, jaw, arms, back)     Denies  3. ONSET: When did the chest pain begin? (Minutes, hours or days)      About a week but she states she is unsure  4. PATTERN: Does the pain come and go, or has it been constant since it started?  Does it get worse with exertion?      Comes and goes  5. DURATION: How long does it last (e.g., seconds, minutes, hours)     She states it lasts for some time  6. SEVERITY: How bad is the pain?  (e.g., Scale 1-10; mild, moderate, or severe)     Monday symptoms were off the hook and severe, yesterday symptoms were not as severe 7. CAUSE: What do you think is causing the chest pain?     Unsure she believes symptoms occur based on what she eats  8. OTHER SYMPTOMS: Do you  have any other symptoms? (e.g., dizziness, nausea, vomiting, sweating, fever, difficulty breathing, cough)       Denies  Protocols used: Chest Pain-A-AH

## 2024-01-25 NOTE — Telephone Encounter (Signed)
 There is openings today with other providers for her to been for her chest pain. Please and see if she wants to be seen today versus tomorrow

## 2024-01-26 ENCOUNTER — Ambulatory Visit: Admitting: Medical

## 2024-01-26 ENCOUNTER — Ambulatory Visit: Payer: Self-pay | Admitting: Medical

## 2024-01-26 LAB — BASIC METABOLIC PANEL WITH GFR
BUN/Creatinine Ratio: 14 (ref 12–28)
BUN: 9 mg/dL (ref 8–27)
CO2: 21 mmol/L (ref 20–29)
Calcium: 9.9 mg/dL (ref 8.7–10.3)
Chloride: 102 mmol/L (ref 96–106)
Creatinine, Ser: 0.66 mg/dL (ref 0.57–1.00)
Glucose: 92 mg/dL (ref 70–99)
Potassium: 4.5 mmol/L (ref 3.5–5.2)
Sodium: 138 mmol/L (ref 134–144)
eGFR: 99 mL/min/1.73 (ref >59)

## 2024-01-26 LAB — CBC
Hematocrit: 39.6 % (ref 34.0–46.6)
Hemoglobin: 12.9 g/dL (ref 11.1–15.9)
MCH: 27.5 pg (ref 26.6–33.0)
MCHC: 32.6 g/dL (ref 31.5–35.7)
MCV: 84 fL (ref 79–97)
Platelets: 409 x10E3/uL (ref 150–450)
RBC: 4.69 x10E6/uL (ref 3.77–5.28)
RDW: 16.5 % — ABNORMAL HIGH (ref 11.7–15.4)
WBC: 6.6 x10E3/uL (ref 3.4–10.8)

## 2024-01-26 LAB — TSH: TSH: 1.26 u[IU]/mL (ref 0.450–4.500)

## 2024-01-26 NOTE — Progress Notes (Signed)
 Results thru my chart

## 2024-01-30 ENCOUNTER — Other Ambulatory Visit: Payer: Self-pay | Admitting: Medical

## 2024-02-01 ENCOUNTER — Ambulatory Visit: Payer: Medicare HMO | Admitting: Medical

## 2024-02-01 VITALS — BP 120/70 | HR 72 | Ht 64.0 in | Wt 177.4 lb

## 2024-02-01 DIAGNOSIS — E559 Vitamin D deficiency, unspecified: Secondary | ICD-10-CM

## 2024-02-01 DIAGNOSIS — G4700 Insomnia, unspecified: Secondary | ICD-10-CM

## 2024-02-01 DIAGNOSIS — Z7185 Encounter for immunization safety counseling: Secondary | ICD-10-CM

## 2024-02-01 DIAGNOSIS — N3281 Overactive bladder: Secondary | ICD-10-CM

## 2024-02-01 DIAGNOSIS — G8929 Other chronic pain: Secondary | ICD-10-CM

## 2024-02-01 DIAGNOSIS — Z1231 Encounter for screening mammogram for malignant neoplasm of breast: Secondary | ICD-10-CM

## 2024-02-01 DIAGNOSIS — G43711 Chronic migraine without aura, intractable, with status migrainosus: Secondary | ICD-10-CM

## 2024-02-01 DIAGNOSIS — I10 Essential (primary) hypertension: Secondary | ICD-10-CM

## 2024-02-01 DIAGNOSIS — I700 Atherosclerosis of aorta: Secondary | ICD-10-CM | POA: Diagnosis not present

## 2024-02-01 DIAGNOSIS — I2510 Atherosclerotic heart disease of native coronary artery without angina pectoris: Secondary | ICD-10-CM

## 2024-02-01 DIAGNOSIS — I8311 Varicose veins of right lower extremity with inflammation: Secondary | ICD-10-CM | POA: Diagnosis not present

## 2024-02-01 DIAGNOSIS — Z1389 Encounter for screening for other disorder: Secondary | ICD-10-CM

## 2024-02-01 DIAGNOSIS — Z0000 Encounter for general adult medical examination without abnormal findings: Secondary | ICD-10-CM

## 2024-02-01 DIAGNOSIS — M545 Low back pain, unspecified: Secondary | ICD-10-CM | POA: Diagnosis not present

## 2024-02-01 DIAGNOSIS — I8312 Varicose veins of left lower extremity with inflammation: Secondary | ICD-10-CM

## 2024-02-01 DIAGNOSIS — Z780 Asymptomatic menopausal state: Secondary | ICD-10-CM

## 2024-02-01 DIAGNOSIS — Z90710 Acquired absence of both cervix and uterus: Secondary | ICD-10-CM

## 2024-02-01 DIAGNOSIS — Z1211 Encounter for screening for malignant neoplasm of colon: Secondary | ICD-10-CM

## 2024-02-01 NOTE — Progress Notes (Signed)
 Subjective:   HPI  Desiree Thomas is a 63 y.o. female who presents for Chief Complaint  Patient presents with   Annual Exam    Fasting cpe,     Patient Care Team: Shylyn Younce, Alm RAMAN, PA-C as PCP - General (Family Medicine) Beuford Anes, MD as Consulting Physician (Orthopedic Surgery) Sees dentist Sees eye doctor Dr. Massie Bertrand, orthopedics, [facility] Dr. Onetha Epp, neurology Prior care at [facility] before moving her in past year Prior Pain management   Concerns: Desiree Thomas is a 63 year old female who presents for a routine well visit.  She has not completed a bone density osteoporosis screening, which was discussed last year, and a colonoscopy has been delayed due to difficulty obtaining records from a previous procedure over eight years ago. There has been a one-year delay in scheduling due to this issue.  She had a hysterectomy at age 79 due to endometriosis and does not require a Pap smear. She does not have a uterus or cervix.  She quit smoking two years ago and had a CT chest scan last December, which showed no cancer but did reveal cholesterol plaque in her aorta.  Her current medications include amlodipine  10 mg and propanol daily for migraines, atorvastatin  40 mg daily, aspirin 81 mg daily, Repatha , magnesium , fluoxetine  20 mg, Robaxin  as needed, Claritin , a multivitamin, Lyrica  150 mg three times a day, Seroquel  at night for sleep, and oxybutynin  for overactive bladder. She no longer takes vitamin D  as her levels are adequate.  Last visit recently for palpitations we had her increased propranolol  to BID and decreased caffeine and palpitation have improved.   She sees cardiology in consult next month  She reports a recent personal issue with her husband, Hosey, who experienced a period of emotional distress, but she has since resolved her differences. She describes herself as an surveyor, minerals and relies on prayer during challenging times.  She sits  outside in the sun for two hours daily, regardless of the weather, and drinks a lot of water  Quit smoking 02/2022  She has a history of chronic back pain, history of low back surgery and is on disability for pain and migraines.  She does take Lyrica  3 times a day, uses Robaxin . Formerly was seeing pain clinic 2023.  Reviewed their medical, surgical, family, social, medication, and allergy history and updated chart as appropriate.  Past Medical History:  Diagnosis Date   Allergy    Arthritis    Asthma    no inhaler - as a young adult, no current problems   Chronic back pain    Depression    Fibromyalgia    GERD (gastroesophageal reflux disease)    no meds, diet controlled   Hearing loss    45% on right ear   History of meningitis    1992   Hyperlipidemia    Hypertension    pt denies, she said she takes propranolol  for migraines   Migraine     Family History  Problem Relation Age of Onset   Arthritis Mother    Heart disease Mother    Asthma Mother    Diverticulitis Mother    Migraines Mother    Diabetes Father    Diverticulitis Father    Arthritis Sister    Asthma Sister    Migraines Sister    Arthritis Sister    Asthma Sister    Cancer Maternal Grandmother        stomach   Diabetes Paternal Grandmother  Dementia Paternal Grandmother    Migraines Daughter      Current Outpatient Medications:    acetaminophen -codeine (TYLENOL  #3) 300-30 MG tablet, Take 1 tablet by mouth every 6 (six) hours as needed., Disp: , Rfl:    amLODipine  (NORVASC ) 10 MG tablet, TAKE 1 TABLET EVERY DAY, Disp: 90 tablet, Rfl: 2   aspirin EC 81 MG tablet, Take 81 mg by mouth daily. Swallow whole., Disp: , Rfl:    atorvastatin  (LIPITOR) 40 MG tablet, TAKE 1 TABLET EVERY DAY, Disp: 90 tablet, Rfl: 2   cyclobenzaprine (FLEXERIL) 5 MG tablet, Take 5 mg by mouth., Disp: , Rfl:    Evolocumab  (REPATHA  SURECLICK) 140 MG/ML SOAJ, Inject 140 mg into the skin every 14 (fourteen) days., Disp: 2 mL,  Rfl: 8   FLUoxetine  (PROZAC ) 20 MG capsule, TAKE 1 CAPSULE EVERY DAY, Disp: 90 capsule, Rfl: 0   loratadine  (CLARITIN ) 10 MG tablet, Take 10 mg by mouth daily., Disp: , Rfl:    magnesium  gluconate (MAGONATE) 500 (27 Mg) MG TABS tablet, Take 500 mg by mouth at bedtime., Disp: , Rfl:    methocarbamol  (ROBAXIN ) 750 MG tablet, TAKE 1 TABLET TWICE DAILY AS NEEDED FOR MUSCLE SPASM(S), Disp: 60 tablet, Rfl: 0   Multiple Vitamins-Minerals (MULTIVITAMIN WITH MINERALS) tablet, Take 1 tablet by mouth daily., Disp: , Rfl:    oxybutynin  (DITROPAN ) 5 MG tablet, Take 1 tablet (5 mg total) by mouth 2 (two) times daily., Disp: 180 tablet, Rfl: 3   pregabalin  (LYRICA ) 150 MG capsule, TAKE 1 CAPSULE BY MOUTH EVERY MORNING, 1 CAPSULE EVERY AFTERNOON, AND 1 CAPSULE EVERY EVENING, Disp: 90 capsule, Rfl: 2   promethazine  (PHENERGAN ) 12.5 MG tablet, Take 12.5 mg by mouth every 6 (six) hours as needed for nausea or vomiting., Disp: , Rfl:    propranolol  ER (INDERAL  LA) 60 MG 24 hr capsule, TAKE 1 CAPSULE(60 MG) BY MOUTH AT BEDTIME, Disp: 90 capsule, Rfl: 2   QUEtiapine  (SEROQUEL ) 25 MG tablet, TAKE 1 TABLET AT BEDTIME, Disp: 90 tablet, Rfl: 0  Allergies  Allergen Reactions   Morphine Anaphylaxis   Penicillins Hives and Itching   Percocet [Oxycodone -Acetaminophen ] Itching    OK with benadryl    Topamax [Topiramate] Other (See Comments)    Emotional    Vicodin Hp [Hydrocodone -Acetaminophen ] Itching    Severe     Review of Systems  Constitutional:  Negative for chills, fever, malaise/fatigue and weight loss.  HENT:  Negative for congestion, ear pain, hearing loss, sore throat and tinnitus.   Eyes:  Negative for blurred vision, pain and redness.  Respiratory:  Negative for cough, hemoptysis and shortness of breath.   Cardiovascular:  Negative for chest pain, palpitations, orthopnea, claudication and leg swelling.  Gastrointestinal:  Negative for abdominal pain, blood in stool, constipation, diarrhea, nausea and  vomiting.  Genitourinary:  Negative for dysuria, flank pain, frequency, hematuria and urgency.  Musculoskeletal:  Positive for back pain. Negative for falls, joint pain and myalgias.  Skin:  Negative for itching and rash.  Neurological:  Negative for dizziness, tingling, speech change, weakness and headaches.  Endo/Heme/Allergies:  Negative for polydipsia. Does not bruise/bleed easily.  Psychiatric/Behavioral:  Negative for depression and memory loss. The patient is not nervous/anxious and does not have insomnia.          11/01/2023    3:27 PM 01/25/2023    3:02 PM 05/04/2022   12:05 PM 12/09/2021    8:41 AM 09/07/2021   11:39 AM  Depression screen PHQ 2/9  Decreased Interest  0 3 0 0 0  Down, Depressed, Hopeless 0 3 0 0 0  PHQ - 2 Score 0 6 0 0 0  Altered sleeping 3 3 0 0 0  Tired, decreased energy 1 0 0 0 0  Change in appetite 0 1 0 0 0  Feeling bad or failure about yourself  0 0 0 0 0  Trouble concentrating 0 0 0 0 0  Moving slowly or fidgety/restless 0 0 0 0 0  Suicidal thoughts 0 0 0 0 0  PHQ-9 Score 4  10  0  0  0   Difficult doing work/chores Somewhat difficult Not difficult at all Not difficult at all Not difficult at all Not difficult at all     Data saved with a previous flowsheet row definition       Objective:  BP 120/70   Pulse 72   Ht 5' 4 (1.626 m)   Wt 177 lb 6.4 oz (80.5 kg)   SpO2 98%   BMI 30.45 kg/m   Wt Readings from Last 3 Encounters:  02/01/24 177 lb 6.4 oz (80.5 kg)  01/25/24 170 lb 3.2 oz (77.2 kg)  11/16/23 165 lb (74.8 kg)   BP Readings from Last 3 Encounters:  02/01/24 120/70  01/25/24 110/72  11/17/23 (!) 114/58    General appearance: alert, no distress, WD/WN, African American female Skin: tattoo left wrist, otherwise no worrisome lesions HEENT: normocephalic, conjunctiva/corneas normal, sclerae anicteric, PERRLA, EOMi Neck: supple, no lymphadenopathy, no thyromegaly, no masses, normal ROM, no bruits Chest: non tender, normal shape  and expansion Heart: RRR, normal S1, S2, no murmurs Lungs: CTA bilaterally, no wheezes, rhonchi, or rales Abdomen: +bs, soft, non tender, non distended, no masses, no hepatomegaly, no splenomegaly, no bruits Back: lumbar surgical scar, relatively full ROM, non tender, no scoliosis Musculoskeletal: arms nontender upper extremities non tender, no obvious deformity, normal ROM throughout, lower extremities non tender, no obvious deformity, normal ROM throughout Extremities: varicose veins and vein valves, otherwise no edema, no cyanosis, no clubbing Pulses: 2+ symmetric, upper and 1+ lower extremities, normal cap refill Neurological: alert, oriented x 3, CN2-12 intact, strength normal upper extremities and lower extremities, sensation normal throughout, DTRs 2+ throughout, no cerebellar signs, gait normal Psychiatric: normal affect, behavior normal, pleasant  Breast/gyn/rectal - deferred    Assessment and Plan :   Encounter Diagnoses  Name Primary?   Encounter for health maintenance examination in adult Yes   Screening for colon cancer    Postmenopausal estrogen deficiency    Vaccine counseling    Screening for hematuria or proteinuria    Screening mammogram for breast cancer    Aortic atherosclerosis    Chronic bilateral low back pain without sciatica    Chronic migraine without aura, with intractable migraine, so stated, with status migrainosus    Coronary artery disease involving native coronary artery of native heart without angina pectoris    Vitamin D  deficiency    Varicose veins of both lower extremities with inflammation    Overactive bladder    Insomnia, unspecified type    H/O: hysterectomy    Essential hypertension, benign      Physical exam today, discussed preventative measures, screening labs, cancer screenings, vaccines, general wellness recommendations  Vaccination recommendations were reviewed Immunization History  Administered Date(s) Administered   Influenza  Split 12/24/2020, 01/28/2024   Influenza,inj,Quad PF,6+ Mos 12/09/2021   Influenza-Unspecified 11/27/2022   PFIZER(Purple Top)SARS-COV-2 Vaccination 06/12/2019, 07/03/2019, 07/08/2020   PNEUMOCOCCAL CONJUGATE-20 01/28/2024   Pfizer(Comirnaty)Fall Seasonal  Vaccine 12 years and older 01/08/2022, 11/27/2022, 01/28/2024   Pneumococcal Polysaccharide-23 12/24/2020   Respiratory Syncytial Virus Vaccine,Recomb Aduvanted(Arexvy) 01/28/2022   Td 09/04/2020   We will try and get documentation from prior Shingrix   Screening for cancer: Colon cancer screening: Referral for updated colonoscopy.  This was delayed last year by GI given inability to get prior colonoscopy report.  Breast cancer screening: You should perform a self breast exam monthly.   We reviewed recommendations for regular mammograms and breast cancer screening.  Up to date on mammogram  Cervical cancer screening: We reviewed recommendations for pap smear screening.  She is status post hysterectomy   Skin cancer screening: Check your skin regularly for new changes, growing lesions, or other lesions of concern Come in for evaluation if you have skin lesions of concern.  Lung cancer screening: If you have a greater than 20 pack year history of tobacco use, then you may qualify for lung cancer screening with a chest CT scan.   Please call your insurance company to inquire about coverage for this test.  Chest CT 12/24 showed no cancer, but did show aortic atheroscleroses and CAD.    We currently don't have screenings for other cancers besides breast, cervical, colon, and lung cancers.  If you have a strong family history of cancer or have other cancer screening concerns, please let me know.    Bone health: Get at least 150 minutes of aerobic exercise weekly Get weight bearing exercise at least once weekly Bone density test:  A bone density test is an imaging test that uses a type of X-ray to measure the amount of calcium  and  other minerals in your bones. The test may be used to diagnose or screen you for a condition that causes weak or thin bones (osteoporosis), predict your risk for a broken bone (fracture), or determine how well your osteoporosis treatment is working. The bone density test is recommended for females 65 and older, or females or males <65 if certain risk factors such as thyroid  disease, long term use of steroids such as for asthma or rheumatological issues, vitamin D  deficiency, estrogen deficiency, family history of osteoporosis, self or family history of fragility fracture in first degree relative.  Order placed for bone density    Heart health: Get at least 150 minutes of aerobic exercise weekly Limit alcohol  It is important to maintain a healthy blood pressure and healthy cholesterol numbers  Heart disease screening: Screening for heart disease includes screening for blood pressure, fasting lipids, glucose/diabetes screening, BMI height to weight ratio, reviewed of smoking status, physical activity, and diet.    Goals include blood pressure 120/80 or less, maintaining a healthy lipid/cholesterol profile, preventing diabetes or keeping diabetes numbers under good control, not smoking or using tobacco products, exercising most days per week or at least 150 minutes per week of exercise, and eating healthy variety of fruits and vegetables, healthy oils, and avoiding unhealthy food choices like fried food, fast food, high sugar and high cholesterol foods.    Sees cardiology in 02/2024 for consult.  Has recent palpitations (see recent visit here), and prior CAD and aortic atherosclerosis on CT Chest 02/2023.   Medical care options: I recommend you continue to seek care here first for routine care.  We try really hard to have available appointments Monday through Friday daytime hours for sick visits, acute visits, and physicals.  Urgent care should be used for after hours and weekends for significant  issues that cannot wait  till the next day.  The emergency department should be used for significant potentially life-threatening emergencies.  The emergency department is expensive, can often have long wait times for less significant concerns, so try to utilize primary care, urgent care, or telemedicine when possible to avoid unnecessary trips to the emergency department.  Virtual visits and telemedicine have been introduced since the pandemic started in 2020, and can be convenient ways to receive medical care.  We offer virtual appointments as well to assist you in a variety of options to seek medical care.    Separate significant issues discussed: Atherosclerotic cardiovascular disease including aortic and coronary artery involvement Cholesterol plaque in aorta noted. On atorvastatin  and Repatha . Awaiting cardiology consultation. - Continue atorvastatin  and Repatha .  Continue Aspirin 81mg  daily - Await cardiology consultation.  Essential hypertension Hypertension well-controlled on amlodipine . - Continue amlodipine  10 mg daily.  Chronic migraine Migraines well-managed with propranolol , Lyrica , and lifestyle modifications. - Continue propranolol  daily. - Continue Lyrica  150 mg three times a day.  Overactive bladder Managed with oxybutynin , no significant side effects. - Continue oxybutynin  5mg  BID  Insomnia Managed with Seroquel  at night. - Continue Seroquel  at night.  Vitamin D  deficiency-updated labs ordered, not currently on supplement  Hypertension-continue amlodipine  10 mg daily.    Chronic back pain, Chronic neck and arm pain- on Lyrica , Robaxin   Insomnia-continue Seroquel .    Migraines-managed by neurology  Depression in remission-continue Prozac  20 mg daily.  Palpitations - I have her currently using propranolol  BID instead of daily, sees cardiology soon  Estela was seen today for annual exam.  Diagnoses and all orders for this visit:  Encounter for health  maintenance examination in adult -     DG Bone Density; Future -     Ambulatory referral to Gastroenterology -     Lipid panel -     VITAMIN D  25 Hydroxy (Vit-D Deficiency, Fractures) -     Hepatic function panel -     Urinalysis, Routine w reflex microscopic  Screening for colon cancer -     Ambulatory referral to Gastroenterology  Postmenopausal estrogen deficiency -     DG Bone Density; Future  Vaccine counseling  Screening for hematuria or proteinuria  Screening mammogram for breast cancer  Aortic atherosclerosis -     Lipid panel  Chronic bilateral low back pain without sciatica  Chronic migraine without aura, with intractable migraine, so stated, with status migrainosus  Coronary artery disease involving native coronary artery of native heart without angina pectoris  Vitamin D  deficiency -     VITAMIN D  25 Hydroxy (Vit-D Deficiency, Fractures)  Varicose veins of both lower extremities with inflammation  Overactive bladder  Insomnia, unspecified type  H/O: hysterectomy  Essential hypertension, benign    Follow-up pending labs, yearly for physical

## 2024-02-02 ENCOUNTER — Ambulatory Visit: Payer: Self-pay | Admitting: Medical

## 2024-02-02 ENCOUNTER — Other Ambulatory Visit: Payer: Self-pay | Admitting: Medical

## 2024-02-02 LAB — LIPID PANEL
Chol/HDL Ratio: 1.9 ratio (ref 0.0–4.4)
Cholesterol, Total: 107 mg/dL (ref 100–199)
HDL: 55 mg/dL (ref >39)
LDL Chol Calc (NIH): 33 mg/dL (ref 0–99)
Triglycerides: 103 mg/dL (ref 0–149)
VLDL Cholesterol Cal: 19 mg/dL (ref 5–40)

## 2024-02-02 LAB — HEPATIC FUNCTION PANEL
ALT: 27 IU/L (ref 0–32)
AST: 31 IU/L (ref 0–40)
Albumin: 4 g/dL (ref 3.9–4.9)
Alkaline Phosphatase: 132 IU/L (ref 49–135)
Bilirubin Total: <0.2 mg/dL (ref 0.0–1.2)
Bilirubin, Direct: <0.08 mg/dL (ref 0.00–0.40)
Total Protein: 6.5 g/dL (ref 6.0–8.5)

## 2024-02-02 LAB — VITAMIN D 25 HYDROXY (VIT D DEFICIENCY, FRACTURES): Vit D, 25-Hydroxy: 43.9 ng/mL (ref 30.0–100.0)

## 2024-02-02 MED ORDER — METHOCARBAMOL 750 MG PO TABS: ORAL_TABLET | ORAL | 1 refills | Status: AC

## 2024-02-02 MED ORDER — REPATHA SURECLICK 140 MG/ML ~~LOC~~ SOAJ: 140.0000 mg | SUBCUTANEOUS | 11 refills | Status: AC

## 2024-02-02 NOTE — Progress Notes (Signed)
 Results to MyChart

## 2024-02-02 NOTE — Addendum Note (Signed)
 Addended by: VICCI HUSBAND A on: 02/02/2024 11:39 AM   Modules accepted: Orders

## 2024-02-06 ENCOUNTER — Other Ambulatory Visit: Payer: Self-pay | Admitting: Medical

## 2024-02-06 DIAGNOSIS — Z981 Arthrodesis status: Secondary | ICD-10-CM | POA: Diagnosis not present

## 2024-02-06 DIAGNOSIS — M5416 Radiculopathy, lumbar region: Secondary | ICD-10-CM | POA: Diagnosis not present

## 2024-02-21 ENCOUNTER — Ambulatory Visit: Attending: Internal Medicine | Admitting: Internal Medicine

## 2024-02-21 VITALS — BP 123/80 | HR 70 | Ht 62.5 in | Wt 173.0 lb

## 2024-02-21 DIAGNOSIS — I493 Ventricular premature depolarization: Secondary | ICD-10-CM | POA: Diagnosis not present

## 2024-02-21 DIAGNOSIS — R002 Palpitations: Secondary | ICD-10-CM

## 2024-02-21 DIAGNOSIS — I2510 Atherosclerotic heart disease of native coronary artery without angina pectoris: Secondary | ICD-10-CM

## 2024-02-21 DIAGNOSIS — I10 Essential (primary) hypertension: Secondary | ICD-10-CM

## 2024-02-21 DIAGNOSIS — I700 Atherosclerosis of aorta: Secondary | ICD-10-CM

## 2024-02-21 DIAGNOSIS — R072 Precordial pain: Secondary | ICD-10-CM

## 2024-02-21 NOTE — Progress Notes (Signed)
 Cardiology Office Note:  .    Date:  02/21/2024  ID:  Desiree Thomas, DOB Aug 03, 1960, MRN [id number] PCP: Desiree Thomas  [facility] Providers Cardiologist:  None     CC: Chest pain Consulted for the evaluation of chest pain at the behest of Desiree Thomas  History of Present Illness: .    Desiree Thomas is a 63 y.o. female who presents with chest pain. She was referred by Mr. Desiree Thomas for evaluation of palpitations.  She experiences chest pain described as a 'real bad pain' that intensifies and lasts for about 10 to 15 minutes. Initially triggered by eating rice, the pain now occurs without specific food triggers. It is not dull or sharp but feels like squeezing and is sometimes accompanied by a sensation similar to acid reflux.  The pain has occurred multiple times, with the most recent episode on Saturday. During these episodes, she does not experience shortness of breath or palpitations but describes a sensation of something being 'stuck'.  She reports a history of palpitations and has undergone an EKG. Despite changes in caffeine intake and water consumption, the chest pain persists.  Her past medical history includes well-controlled hypertension on amlodipine  and hyperlipidemia managed with atorvastatin  and Repatha . Her LDL levels are at goal.  She recently returned to work and describes her job as busy and physical. No family history of heart problems, although she suspects her mother may have heart issues.  Discussed the use of AI scribe software for clinical note transcription with the patient, who gave verbal consent to proceed.   Relevant histories: .  Social  - Works at [employer]: As per HPI.   Physical Exam:    VS:  BP 123/80 (BP Location: Left Arm)   Pulse 70   Ht 5' 2.5 (1.588 m)   Wt 173 lb (78.5 kg)   SpO2 96%   BMI 31.14 kg/m    Wt Readings from Last 3 Encounters:  02/21/24 173 lb (78.5 kg)  02/01/24 177 lb 6.4 oz (80.5 kg)   01/25/24 170 lb 3.2 oz (77.2 kg)    Gen: No distress   Neck: No JVD Cardiac: No Rubs or Gallops, no Murmur, RRR 2 radial pulses Respiratory: Clear to auscultation bilaterally, normal effort, normal  respiratory rate GI: Soft, nontender, non-distended  MS: No  edema;  moves all extremities Integument: Skin feels warm Neuro:  At time of evaluation, alert and oriented to person/place/time/situation  Psych: Normal affect, patient feels ok exactly manage like he said     ASSESSMENT AND PLAN: .    Evaluation of chest pain and palpitations Intermittent chest pain described as squeezing and burning, sometimes associated with a sensation of something stuck, occurring after eating rice and soda. No clear food-related triggers except for rice. No shortness of breath or palpitations. Differential includes cardiac etiology versus gastrointestinal issues such as swallowing problems. Stress test chosen due to current backlogs and her active lifestyle. - Ordered stress test with nuclear medicine to evaluate for coronary artery disease - Advised to go to the hospital if experiencing severe chest pain or feeling unwell  Assessment for coronary artery disease Given the intermittent chest pain and family history of heart problems, assessment for coronary artery disease is warranted. Stress test chosen to evaluate for potential blockages or calcium  deposits. Discussed potential outcomes: blockage requiring medication or further invasive testing, presence of calcium  deposits, or no findings indicating non-cardiac causes. - Ordered stress test with nuclear medicine to assess  for coronary artery disease - If stress test indicates blockage, will consider medical management and potential further invasive testing - If stress test shows calcium  deposits, will continue aggressive management with current medications - If stress test is normal, will coordinate with primary care for further evaluation of non-cardiac  causes  Essential hypertension Hypertension is well controlled on amlodipine .  Hyperlipidemia Well controlled on atorvastatin  and Repatha . LDL is at goal. - Continue current lipid-lowering therapy with atorvastatin  and Repatha  patient is after it is hard we could do the right thing to do  Follow up with my team in 3 months unless markedly abnormal testing  Desiree Leavens, MD FASE [facility] Cardiologist [facility]  [address], [address] [place], [place] [address] [phone]  4:01 PM

## 2024-02-21 NOTE — Patient Instructions (Signed)
 Medication Instructions:  Your physician recommends that you continue on your current medications as directed. Please refer to the Current Medication list given to you today.  *If you need a refill on your cardiac medications before your next appointment, please call your pharmacy*  Lab Work: NONE  If you have labs (blood work) drawn today and your tests are completely normal, you will receive your results only by: MyChart Message (if you have MyChart) OR A paper copy in the mail If you have any lab test that is abnormal or we need to change your treatment, we will call you to review the results.  Testing/Procedures: Your physician has requested that you have en exercise stress myoview. For further information please visit [email or web address] Please follow instructions.    You are scheduled for a Myocardial Perfusion Imaging Study. Please arrive 15 minutes prior to your appointment time for registration and insurance purposes.   The test will take approximately 3 to 4 hours to complete; you may bring reading material.  If someone comes with you to your appointment, they will need to remain in the main lobby due to limited space in the testing area.    How to prepare for your Myocardial Perfusion Test: Do not eat or drink 3 hours prior to your test, except you may have water. Do not consume products containing caffeine (regular or decaffeinated) 12 hours prior to your test. (ex: coffee, chocolate, sodas, tea). Do bring a list of your current medications with you.  If not listed below, you may take your medications as normal. Do wear comfortable clothes (no dresses or overalls) and walking shoes, tennis shoes preferred (No heels or open toe shoes are allowed). Do NOT wear cologne, perfume, aftershave, or lotions (deodorant is allowed). If these instructions are not followed, your test will have to be rescheduled.  If you cannot keep your appointment, please provide 24 hours notification  to the Nuclear Lab, to avoid a possible $50 charge to your account.      Follow-Up: At [facility], you and your health needs are our priority.  As part of our continuing mission to provide you with exceptional heart care, our providers are all part of one team.  This team includes your primary Cardiologist (physician) and Advanced Practice Providers or APPs (Physician Assistants and Nurse Practitioners) who all work together to provide you with the care you need, when you need it.  Your next appointment:   3 month(s)  Provider:   One of our Advanced Practice Providers (APPs): Morse Clause, PA-C  Lamarr Satterfield, NP Miriam Shams, NP  Olivia Pavy, PA-C Josefa Beauvais, NP  Leontine Salen, PA-C Orren Fabry, PA-C  [place], PA-C Ernest Dick, NP  Damien Braver, NP Jon Hails, PA-C  Waddell Donath, PA-C    Dayna Dunn, PA-C  Scott Weaver, PA-C Lum Louis, NP Katlyn West, NP Callie Goodrich, PA-C  Xika Zhao, NP Sheng Haley, PA-C    Kathleen Johnson, PA-C

## 2024-02-22 ENCOUNTER — Ambulatory Visit: Admitting: Student in an Organized Health Care Education/Training Program

## 2024-03-01 ENCOUNTER — Telehealth (HOSPITAL_COMMUNITY): Payer: Self-pay | Admitting: *Deleted

## 2024-03-01 NOTE — Telephone Encounter (Signed)
 Left message on voicemail per DPR in reference to upcoming appointment scheduled on 03/12/2024 at 1:00 with detailed instructions given per Myocardial Perfusion Study Information Sheet for the test. LM to arrive 15 minutes early, and that it is imperative to arrive on time for appointment to keep from having the test rescheduled. If you need to cancel or reschedule your appointment, please call the office within 24 hours of your appointment. Failure to do so may result in a cancellation of your appointment, and a $50 no show fee. Phone number given for call back for any questions.

## 2024-03-06 ENCOUNTER — Other Ambulatory Visit: Payer: Self-pay | Admitting: Internal Medicine

## 2024-03-06 DIAGNOSIS — R072 Precordial pain: Secondary | ICD-10-CM

## 2024-03-06 DIAGNOSIS — R079 Chest pain, unspecified: Secondary | ICD-10-CM

## 2024-03-12 ENCOUNTER — Ambulatory Visit (HOSPITAL_COMMUNITY)
Admission: RE | Admit: 2024-03-12 | Discharge: 2024-03-12 | Disposition: A | Discharge status: Home / Self Care | Source: Ambulatory Visit | Attending: Internal Medicine | Admitting: Internal Medicine

## 2024-03-12 DIAGNOSIS — R079 Chest pain, unspecified: Secondary | ICD-10-CM | POA: Diagnosis present

## 2024-03-12 DIAGNOSIS — R072 Precordial pain: Secondary | ICD-10-CM | POA: Insufficient documentation

## 2024-03-12 LAB — MYOCARDIAL PERFUSION IMAGING
LV dias vol: 64 mL (ref 46–106)
LV sys vol: 20 mL
Nuc Stress EF: 69 %
Peak HR: 91 {beats}/min
Rest HR: 65 {beats}/min
Rest Nuclear Isotope Dose: 10.2 mCi
SDS: 1
SRS: 1
SSS: 2
ST Depression (mm): 0 mm
Stress Nuclear Isotope Dose: 32.4 mCi
TID: 1.03

## 2024-03-12 MED ORDER — REGADENOSON 0.4 MG/5ML IV SOLN
0.4000 mg | Freq: Once | INTRAVENOUS | Status: AC
  Administered 2024-03-12: 0.4 mg via INTRAVENOUS

## 2024-03-12 MED ORDER — REGADENOSON 0.4 MG/5ML IV SOLN
INTRAVENOUS | Status: AC
  Filled 2024-03-12: qty 5

## 2024-03-12 MED ORDER — TECHNETIUM TC 99M TETROFOSMIN IV KIT
32.4000 | PACK | Freq: Once | INTRAVENOUS | Status: AC | PRN
  Administered 2024-03-12: 32.4 via INTRAVENOUS

## 2024-03-12 MED ORDER — TECHNETIUM TC 99M TETROFOSMIN IV KIT
10.2000 | PACK | Freq: Once | INTRAVENOUS | Status: AC | PRN
  Administered 2024-03-12: 10.2 via INTRAVENOUS

## 2024-03-16 ENCOUNTER — Ambulatory Visit: Payer: Self-pay

## 2024-03-22 ENCOUNTER — Other Ambulatory Visit: Payer: Self-pay | Admitting: Medical

## 2024-03-22 ENCOUNTER — Ambulatory Visit (INDEPENDENT_AMBULATORY_CARE_PROVIDER_SITE_OTHER): Payer: Medicare (Managed Care) | Admitting: Medical

## 2024-03-22 VITALS — BP 110/70 | HR 64 | Temp 97.8°F | Wt 171.0 lb

## 2024-03-22 DIAGNOSIS — K219 Gastro-esophageal reflux disease without esophagitis: Secondary | ICD-10-CM | POA: Diagnosis not present

## 2024-03-22 DIAGNOSIS — M545 Low back pain, unspecified: Secondary | ICD-10-CM

## 2024-03-22 DIAGNOSIS — Z122 Encounter for screening for malignant neoplasm of respiratory organs: Secondary | ICD-10-CM

## 2024-03-22 DIAGNOSIS — R0789 Other chest pain: Secondary | ICD-10-CM | POA: Diagnosis not present

## 2024-03-22 DIAGNOSIS — Z87891 Personal history of nicotine dependence: Secondary | ICD-10-CM

## 2024-03-22 DIAGNOSIS — Z720 Tobacco use: Secondary | ICD-10-CM

## 2024-03-22 DIAGNOSIS — M542 Cervicalgia: Secondary | ICD-10-CM

## 2024-03-22 DIAGNOSIS — Z1211 Encounter for screening for malignant neoplasm of colon: Secondary | ICD-10-CM | POA: Diagnosis not present

## 2024-03-22 DIAGNOSIS — G8929 Other chronic pain: Secondary | ICD-10-CM

## 2024-03-22 MED ORDER — PANTOPRAZOLE SODIUM 40 MG PO TBEC: DELAYED_RELEASE_TABLET | ORAL | 3 refills | Status: AC

## 2024-03-22 MED ORDER — PREGABALIN 150 MG PO CAPS: ORAL_CAPSULE | ORAL | 1 refills | Status: AC

## 2024-03-22 NOTE — Progress Notes (Signed)
 "  Name: Desiree Thomas   Date of Visit: 03/22/2024   Date of last visit with me: 03/22/2024   CHIEF COMPLAINT:  Chief Complaint  Patient presents with   Acute Visit    Acid reflux had some stress tests done and still having issues with pain. Was advise to use pedcid AD but no working       HPI:  Discussed the use of AI scribe software for clinical note transcription with the patient, who gave verbal consent to proceed.  History of Present Illness   Desiree Thomas is a 64 year old female who presents with worsening acid reflux symptoms.  She experiences acid reflux even after consuming coffee, which she suspects might be a trigger. There have been no recent changes in her diet that could have exacerbated her symptoms. She typically consumes one and a half cups of coffee a day. She feels stress is playing a role.  She has previously undergone diagnostic procedures, including a recent heart stress test. She has not received any direct communication regarding her heart test results but was informed that the results were sent via MyChart.  She is currently on medications including Repatha  and Lipitor for cholesterol management and also takes aspirin.  She has a history of smoking, although she has reduced her usage and occasionally smokes to manage pain. She has undergone both upper and lower endoscopies, with the last colonoscopy in 2019 revealing a tubular adenoma. She has a family history of digital colitis in both parents.  No other aggravating or relieving factors. No other complaint.   ROS as in subjective  Past Medical History:  Diagnosis Date   Allergy    Arthritis    Asthma    no inhaler - as a young adult, no current problems   Chronic back pain    Depression    Fibromyalgia    GERD (gastroesophageal reflux disease)    no meds, diet controlled   Hearing loss    45% on right ear   History of meningitis    1992   Hyperlipidemia    Hypertension    pt denies, she said  she takes propranolol  for migraines   Migraine     Medications Ordered Prior to Encounter[1]    OBJECTIVE:    BP 110/70   Pulse 64   Temp 97.8 F (36.6 C)   Wt 171 lb (77.6 kg)   SpO2 98%   BMI 30.78 kg/m   BP Readings from Last 3 Encounters:  03/22/24 110/70  02/21/24 123/80  02/01/24 120/70    Wt Readings from Last 3 Encounters:  03/22/24 171 lb (77.6 kg)  02/21/24 173 lb (78.5 kg)  02/01/24 177 lb 6.4 oz (80.5 kg)    General appearence: alert, no distress, WD/WN,  Neck: supple, no lymphadenopathy, no thyromegaly, no masses Heart: RRR, normal S1, S2, no murmurs Lungs: CTA bilaterally, no wheezes, rhonchi, or rales Abdomen: +bs, soft, non tender, non distended, no masses, no hepatomegaly, no splenomegaly Pulses: 2+ symmetric, upper and lower extremities, normal cap refill    ASSESSMENT/PLAN:   Encounter Diagnoses  Name Primary?   Gastroesophageal reflux disease, unspecified whether esophagitis present Yes   Chest discomfort    Vapes nicotine containing substance    Former smoker    Screening for lung cancer    Screening for colon cancer    Chronic bilateral low back pain without sciatica    Chronic neck pain     Gastroesophageal reflux disease Chronic GERD  with inadequate control on OTC Pepcid. Previous endoscopy 2019 unremarkable. Long-term Protonix  use may affect vitamin absorption and increase osteoporosis risk. - Prescribed Protonix  30-45 minutes before breakfast. - Advised dietary modifications to avoid reflux triggers. - Discussed long-term risks of Protonix .  Consider stopping after 1 month trial. - Recommended Tums as needed.  Coronary atherosclerosis Cholesterol plaque in coronaries and aorta. Reviewed recent stress test 02/2024 showing no ischemia or decreased blood flow. - Continue current medications including Repatha  and Lipitor.  Nicotine dependence Intermittent smoking and vaping as a coping mechanism for pain. - Ordered chest CT for  lung cancer screening. - Encouraged reduction in smoking and vaping.  General health maintenance Overdue for colonoscopy due to previous tubular adenoma and family history of ulcerative colitis. - Referred to gastroenterology for colonoscopy. -referral for chest CT lung cancer screen  Chronic neck pain - refilled lyrica  -has tolerated narcotics due to adverse effects   [name] was seen today for acute visit.  Diagnoses and all orders for this visit:  Gastroesophageal reflux disease, unspecified whether esophagitis present  Chest discomfort  Vapes nicotine containing substance -     CT CHEST LUNG CA SCREEN LOW DOSE W/O CM; Future  Former smoker -     CT CHEST LUNG CA SCREEN LOW DOSE W/O CM; Future  Screening for lung cancer -     CT CHEST LUNG CA SCREEN LOW DOSE W/O CM; Future  Screening for colon cancer -     Ambulatory referral to Gastroenterology  Chronic bilateral low back pain without sciatica  Chronic neck pain  Other orders -     pantoprazole  (PROTONIX ) 40 MG tablet; 1 tablet daily po 45 min prior to breakfast -     pregabalin  (LYRICA ) 150 MG capsule; TAKE 1 CAPSULE BY MOUTH EVERY MORNING, 1 CAPSULE EVERY AFTERNOON, AND 1 CAPSULE EVERY EVENING    [facility]    [1]  Current Outpatient Medications on File Prior to Visit  Medication Sig Dispense Refill   acetaminophen -codeine (TYLENOL  #3) 300-30 MG tablet Take 1 tablet by mouth every 6 (six) hours as needed.     amLODipine  (NORVASC ) 10 MG tablet TAKE 1 TABLET EVERY DAY 90 tablet 2   aspirin EC 81 MG tablet Take 81 mg by mouth daily. Swallow whole.     atorvastatin  (LIPITOR) 40 MG tablet TAKE 1 TABLET EVERY DAY 90 tablet 2   Evolocumab  (REPATHA  SURECLICK) 140 MG/ML SOAJ Inject 140 mg into the skin every 14 (fourteen) days. 2 mL 11   FLUoxetine  (PROZAC ) 20 MG capsule TAKE 1 CAPSULE EVERY DAY 90 capsule 0   loratadine  (CLARITIN ) 10 MG tablet Take 10 mg by mouth daily.      magnesium  gluconate (MAGONATE) 500 (27 Mg) MG TABS tablet Take 500 mg by mouth at bedtime.     methocarbamol  (ROBAXIN ) 750 MG tablet TAKE 1 TABLET TWICE DAILY AS NEEDED FOR MUSCLE SPASM(S) 180 tablet 1   Multiple Vitamins-Minerals (MULTIVITAMIN WITH MINERALS) tablet Take 1 tablet by mouth daily.     oxybutynin  (DITROPAN ) 5 MG tablet Take 1 tablet (5 mg total) by mouth 2 (two) times daily. 180 tablet 3   promethazine  (PHENERGAN ) 12.5 MG tablet Take 12.5 mg by mouth every 6 (six) hours as needed for nausea or vomiting.     propranolol  ER (INDERAL  LA) 60 MG 24 hr capsule TAKE 1 CAPSULE(60 MG) BY MOUTH AT BEDTIME 90 capsule 2   QUEtiapine  (SEROQUEL ) 25 MG tablet TAKE 1 TABLET AT  BEDTIME 90 tablet 0   No current facility-administered medications on file prior to visit.   "

## 2024-04-02 ENCOUNTER — Ambulatory Visit: Payer: Medicare (Managed Care) | Admitting: Medical

## 2024-04-02 VITALS — BP 118/72 | HR 54 | Wt 171.2 lb

## 2024-04-02 DIAGNOSIS — E785 Hyperlipidemia, unspecified: Secondary | ICD-10-CM

## 2024-04-02 DIAGNOSIS — I700 Atherosclerosis of aorta: Secondary | ICD-10-CM

## 2024-04-02 DIAGNOSIS — I10 Essential (primary) hypertension: Secondary | ICD-10-CM | POA: Diagnosis not present

## 2024-04-02 NOTE — Progress Notes (Signed)
 "  Name: Desiree Thomas   Date of Visit: 04/02/24   Date of last visit with me: 03/22/2024   CHIEF COMPLAINT:  Chief Complaint  Patient presents with   Follow-up    Follow-up on BP. [facility] came out  last Wednesday and bp was 138/110 sitting and then 140/110 standing and her bp has never been that high, so pt starting taking it herself on Wednesday to check and 135/84 131/83 124/67 151/84 166/86 109/67.        HPI:  Discussed the use of AI scribe software for clinical note transcription with the patient, who gave verbal consent to proceed.  History of Present Illness  Desiree Thomas is a 64 year old female with hypertension who presents for a follow-up due to elevated blood pressures.  She reports that she is here for a follow-up visit due to elevated blood pressures noted during a home visit by [facility] nurse from insurer.   BP readings that day were unusually high, different than her norm, and was told she had almost stroke level readings which caused anxiety.  Her own blood pressure readings at home have been normal, and she brought her device to the appointment to verify this. No recent swelling in her legs or chest pain, and a previous pain has 'calmed down considerably'. She is currently on amlodipine  10 mg daily and propranolol  ER 60 mg once daily.  She expresses concern about a recent referral for gastroenterology, mentioning the need for both an upper and lower GI evaluation due to symptoms she has been experiencing. She has not yet received a call for the appointment and is awaiting further instructions.   Past Medical History:  Diagnosis Date   Allergy    Arthritis    Asthma    no inhaler - as a young adult, no current problems   Chronic back pain    Depression    Fibromyalgia    GERD (gastroesophageal reflux disease)    no meds, diet controlled   Hearing loss    45% on right ear   History of meningitis    1992   Hyperlipidemia    Hypertension    pt  denies, she said she takes propranolol  for migraines   Migraine    Current Outpatient Medications on File Prior to Visit  Medication Sig Dispense Refill   acetaminophen -codeine (TYLENOL  #3) 300-30 MG tablet Take 1 tablet by mouth every 6 (six) hours as needed.     amLODipine  (NORVASC ) 10 MG tablet TAKE 1 TABLET EVERY DAY 90 tablet 2   aspirin EC 81 MG tablet Take 81 mg by mouth daily. Swallow whole.     atorvastatin  (LIPITOR) 40 MG tablet TAKE 1 TABLET EVERY DAY 90 tablet 2   Evolocumab  (REPATHA  SURECLICK) 140 MG/ML SOAJ Inject 140 mg into the skin every 14 (fourteen) days. 2 mL 11   FLUoxetine  (PROZAC ) 20 MG capsule TAKE 1 CAPSULE EVERY DAY 90 capsule 0   loratadine  (CLARITIN ) 10 MG tablet Take 10 mg by mouth daily.     magnesium  gluconate (MAGONATE) 500 (27 Mg) MG TABS tablet Take 500 mg by mouth at bedtime.     methocarbamol  (ROBAXIN ) 750 MG tablet TAKE 1 TABLET TWICE DAILY AS NEEDED FOR MUSCLE SPASM(S) 180 tablet 1   oxybutynin  (DITROPAN ) 5 MG tablet Take 1 tablet (5 mg total) by mouth 2 (two) times daily. 180 tablet 3   pantoprazole  (PROTONIX ) 40 MG tablet 1 tablet daily po 45 min prior to breakfast  30 tablet 3   pregabalin  (LYRICA ) 150 MG capsule TAKE 1 CAPSULE BY MOUTH EVERY MORNING, 1 CAPSULE EVERY AFTERNOON, AND 1 CAPSULE EVERY EVENING 270 capsule 1   promethazine  (PHENERGAN ) 12.5 MG tablet Take 12.5 mg by mouth every 6 (six) hours as needed for nausea or vomiting.     propranolol  ER (INDERAL  LA) 60 MG 24 hr capsule TAKE 1 CAPSULE(60 MG) BY MOUTH AT BEDTIME 90 capsule 2   QUEtiapine  (SEROQUEL ) 25 MG tablet TAKE 1 TABLET AT BEDTIME 90 tablet 0   Multiple Vitamins-Minerals (MULTIVITAMIN WITH MINERALS) tablet Take 1 tablet by mouth daily.     No current facility-administered medications on file prior to visit.    ROS as in subjective   Objective: BP 118/72   Pulse (!) 54   Wt 171 lb 3.2 oz (77.7 kg)   SpO2 95%   BMI 30.81 kg/m   BP Readings from Last 3 Encounters:  04/02/24  118/72  03/22/24 110/70  02/21/24 123/80   Right arm BP 108/68, left arm 110/70  General appearence: alert, no distress, WD/WN,  Heart: RRR, normal S1, S2, no murmurs Lungs: CTA bilaterally, no wheezes, rhonchi, or rales Pulses: 2+ symmetric, upper and lower extremities, normal cap refill Ext: no edema    Assessment: Encounter Diagnoses  Name Primary?   Essential hypertension, benign Yes   Aortic atherosclerosis    Hyperlipidemia, unspecified hyperlipidemia type      Plan:  Essential hypertension BP normal today per nurse and my own readings and similar to prior readings here.  Reassured - Current regimen effective. - Continue amlodipine  10 mg daily and propranolol  ER 60 mg daily.  Hyperlipidemia Current regimen effective. - Continue aspirin 81 mg daily, atorvastatin  40 mg daily, and Repatha  140 mg every two weeks.    Desiree Thomas was seen today for follow-up.  Diagnoses and all orders for this visit:  Essential hypertension, benign  Aortic atherosclerosis  Hyperlipidemia, unspecified hyperlipidemia type    F/u prn     "

## 2024-04-05 ENCOUNTER — Ambulatory Visit
Admission: RE | Admit: 2024-04-05 | Discharge: 2024-04-05 | Disposition: A | Discharge status: Home / Self Care | Payer: Medicare (Managed Care) | Source: Ambulatory Visit | Attending: Medical | Admitting: Medical

## 2024-04-05 DIAGNOSIS — Z720 Tobacco use: Secondary | ICD-10-CM

## 2024-04-05 DIAGNOSIS — Z87891 Personal history of nicotine dependence: Secondary | ICD-10-CM

## 2024-04-05 DIAGNOSIS — Z122 Encounter for screening for malignant neoplasm of respiratory organs: Secondary | ICD-10-CM

## 2024-04-06 ENCOUNTER — Encounter: Payer: Self-pay | Admitting: Gastroenterology

## 2024-04-06 ENCOUNTER — Ambulatory Visit: Admitting: Medical

## 2024-04-06 ENCOUNTER — Ambulatory Visit: Payer: Medicare (Managed Care) | Admitting: Nurse Practitioner

## 2024-04-10 ENCOUNTER — Other Ambulatory Visit: Payer: Self-pay | Admitting: Medical

## 2024-04-10 ENCOUNTER — Ambulatory Visit: Payer: Self-pay | Admitting: Medical

## 2024-04-10 NOTE — Progress Notes (Signed)
 Results thru my chart

## 2024-04-10 NOTE — Telephone Encounter (Signed)
 SABRA

## 2024-05-15 ENCOUNTER — Ambulatory Visit: Payer: Medicare (Managed Care) | Admitting: Gastroenterology

## 2024-05-23 ENCOUNTER — Ambulatory Visit: Admitting: Cardiology

## 2024-11-13 ENCOUNTER — Ambulatory Visit: Payer: Self-pay

## 2025-02-04 ENCOUNTER — Encounter: Admitting: Medical
